# Patient Record
Sex: Male | Born: 1955
Health system: Southern US, Community
[De-identification: ages and names within clinical notes are randomized; demographics above are authoritative.]

## PROBLEM LIST (undated history)

## (undated) DIAGNOSIS — I2510 Atherosclerotic heart disease of native coronary artery without angina pectoris: Secondary | ICD-10-CM

## (undated) DIAGNOSIS — G839 Paralytic syndrome, unspecified: Secondary | ICD-10-CM

## (undated) DIAGNOSIS — Z8489 Family history of other specified conditions: Secondary | ICD-10-CM

## (undated) DIAGNOSIS — D751 Secondary polycythemia: Secondary | ICD-10-CM

## (undated) DIAGNOSIS — R0600 Dyspnea, unspecified: Secondary | ICD-10-CM

## (undated) DIAGNOSIS — K449 Diaphragmatic hernia without obstruction or gangrene: Secondary | ICD-10-CM

## (undated) DIAGNOSIS — F32A Depression, unspecified: Secondary | ICD-10-CM

## (undated) DIAGNOSIS — T7840XA Allergy, unspecified, initial encounter: Secondary | ICD-10-CM

## (undated) DIAGNOSIS — M1990 Unspecified osteoarthritis, unspecified site: Secondary | ICD-10-CM

## (undated) DIAGNOSIS — J849 Interstitial pulmonary disease, unspecified: Secondary | ICD-10-CM

## (undated) DIAGNOSIS — F419 Anxiety disorder, unspecified: Secondary | ICD-10-CM

## (undated) DIAGNOSIS — K259 Gastric ulcer, unspecified as acute or chronic, without hemorrhage or perforation: Secondary | ICD-10-CM

## (undated) DIAGNOSIS — R519 Headache, unspecified: Secondary | ICD-10-CM

## (undated) DIAGNOSIS — J189 Pneumonia, unspecified organism: Secondary | ICD-10-CM

## (undated) DIAGNOSIS — C801 Malignant (primary) neoplasm, unspecified: Secondary | ICD-10-CM

## (undated) DIAGNOSIS — D759 Disease of blood and blood-forming organs, unspecified: Secondary | ICD-10-CM

## (undated) DIAGNOSIS — Z87442 Personal history of urinary calculi: Secondary | ICD-10-CM

## (undated) DIAGNOSIS — N200 Calculus of kidney: Secondary | ICD-10-CM

## (undated) DIAGNOSIS — J302 Other seasonal allergic rhinitis: Secondary | ICD-10-CM

## (undated) DIAGNOSIS — J45909 Unspecified asthma, uncomplicated: Secondary | ICD-10-CM

## (undated) DIAGNOSIS — G4730 Sleep apnea, unspecified: Secondary | ICD-10-CM

## (undated) DIAGNOSIS — K219 Gastro-esophageal reflux disease without esophagitis: Secondary | ICD-10-CM

## (undated) HISTORY — DX: Anxiety disorder, unspecified: F41.9

## (undated) HISTORY — DX: Malignant (primary) neoplasm, unspecified: C80.1

## (undated) HISTORY — DX: Allergy, unspecified, initial encounter: T78.40XA

## (undated) HISTORY — DX: Sleep apnea, unspecified: G47.30

## (undated) HISTORY — DX: Gastric ulcer, unspecified as acute or chronic, without hemorrhage or perforation: K25.9

## (undated) HISTORY — PX: HERNIA REPAIR: SHX51

## (undated) HISTORY — PX: MELANOMA EXCISION: SHX5266

## (undated) HISTORY — DX: Calculus of kidney: N20.0

## (undated) HISTORY — DX: Gastro-esophageal reflux disease without esophagitis: K21.9

## (undated) HISTORY — DX: Other seasonal allergic rhinitis: J30.2

## (undated) HISTORY — DX: Diaphragmatic hernia without obstruction or gangrene: K44.9

## (undated) HISTORY — PX: KNEE SURGERY: SHX244

## (undated) HISTORY — DX: Unspecified osteoarthritis, unspecified site: M19.90

## (undated) HISTORY — DX: Unspecified asthma, uncomplicated: J45.909

---

## 1999-11-06 ENCOUNTER — Ambulatory Visit (HOSPITAL_BASED_OUTPATIENT_CLINIC_OR_DEPARTMENT_OTHER): Admission: RE | Admit: 1999-11-06 | Discharge: 1999-11-06 | Payer: Self-pay | Admitting: General Surgery

## 2005-08-03 ENCOUNTER — Ambulatory Visit: Payer: Self-pay | Admitting: Family Medicine

## 2007-09-18 DIAGNOSIS — K219 Gastro-esophageal reflux disease without esophagitis: Secondary | ICD-10-CM

## 2007-09-18 DIAGNOSIS — Z87442 Personal history of urinary calculi: Secondary | ICD-10-CM | POA: Insufficient documentation

## 2013-11-22 DIAGNOSIS — K259 Gastric ulcer, unspecified as acute or chronic, without hemorrhage or perforation: Secondary | ICD-10-CM

## 2013-11-22 HISTORY — PX: COLONOSCOPY: SHX174

## 2013-11-22 HISTORY — DX: Gastric ulcer, unspecified as acute or chronic, without hemorrhage or perforation: K25.9

## 2015-11-23 HISTORY — PX: POLYPECTOMY: SHX149

## 2015-11-23 HISTORY — PX: COLONOSCOPY: SHX174

## 2016-07-19 ENCOUNTER — Encounter: Payer: Self-pay | Admitting: Gastroenterology

## 2017-08-24 ENCOUNTER — Ambulatory Visit (INDEPENDENT_AMBULATORY_CARE_PROVIDER_SITE_OTHER): Payer: Managed Care, Other (non HMO) | Admitting: Family Medicine

## 2017-08-24 ENCOUNTER — Encounter: Payer: Self-pay | Admitting: Family Medicine

## 2017-08-24 VITALS — BP 130/88 | HR 74 | Temp 98.1°F | Ht 68.0 in | Wt 222.0 lb

## 2017-08-24 DIAGNOSIS — K259 Gastric ulcer, unspecified as acute or chronic, without hemorrhage or perforation: Secondary | ICD-10-CM | POA: Diagnosis not present

## 2017-08-24 DIAGNOSIS — K2100 Gastro-esophageal reflux disease with esophagitis, without bleeding: Secondary | ICD-10-CM

## 2017-08-24 DIAGNOSIS — Z85820 Personal history of malignant melanoma of skin: Secondary | ICD-10-CM | POA: Insufficient documentation

## 2017-08-24 DIAGNOSIS — Z23 Encounter for immunization: Secondary | ICD-10-CM

## 2017-08-24 DIAGNOSIS — Z0000 Encounter for general adult medical examination without abnormal findings: Secondary | ICD-10-CM

## 2017-08-24 DIAGNOSIS — Z0001 Encounter for general adult medical examination with abnormal findings: Secondary | ICD-10-CM

## 2017-08-24 LAB — LIPID PANEL
CHOLESTEROL: 143 mg/dL (ref 0–200)
HDL: 33.1 mg/dL — ABNORMAL LOW (ref >39.00)
LDL Cholesterol: 75 mg/dL (ref 0–99)
NonHDL: 109.4
TRIGLYCERIDES: 171 mg/dL — AB (ref 0.0–149.0)
Total CHOL/HDL Ratio: 4
VLDL: 34.2 mg/dL (ref 0.0–40.0)

## 2017-08-24 LAB — CBC WITH DIFFERENTIAL/PLATELET
Basophils Absolute: 0.1 10*3/uL (ref 0.0–0.1)
Basophils Relative: 1 % (ref 0.0–3.0)
EOS PCT: 5.5 % — AB (ref 0.0–5.0)
Eosinophils Absolute: 0.4 10*3/uL (ref 0.0–0.7)
HCT: 49.9 % (ref 39.0–52.0)
Hemoglobin: 16.7 g/dL (ref 13.0–17.0)
LYMPHS ABS: 1.9 10*3/uL (ref 0.7–4.0)
Lymphocytes Relative: 24.5 % (ref 12.0–46.0)
MCHC: 33.4 g/dL (ref 30.0–36.0)
MCV: 92.9 fl (ref 78.0–100.0)
MONOS PCT: 8.3 % (ref 3.0–12.0)
Monocytes Absolute: 0.6 10*3/uL (ref 0.1–1.0)
NEUTROS ABS: 4.6 10*3/uL (ref 1.4–7.7)
NEUTROS PCT: 60.7 % (ref 43.0–77.0)
PLATELETS: 345 10*3/uL (ref 150.0–400.0)
RBC: 5.37 Mil/uL (ref 4.22–5.81)
RDW: 13.4 % (ref 11.5–15.5)
WBC: 7.6 10*3/uL (ref 4.0–10.5)

## 2017-08-24 LAB — POC URINALSYSI DIPSTICK (AUTOMATED)
BILIRUBIN UA: NEGATIVE
CLARITY UA: NEGATIVE
Glucose, UA: NEGATIVE
KETONES UA: NEGATIVE
LEUKOCYTES UA: NEGATIVE
Nitrite, UA: NEGATIVE
Spec Grav, UA: >=1.03 — AB (ref 1.010–1.025)
Urobilinogen, UA: 0.2 E.U./dL
pH, UA: 6 (ref 5.0–8.0)

## 2017-08-24 LAB — PSA: PSA: 2.19 ng/mL (ref 0.10–4.00)

## 2017-08-24 LAB — BASIC METABOLIC PANEL
BUN: 16 mg/dL (ref 6–23)
CALCIUM: 9.5 mg/dL (ref 8.4–10.5)
CO2: 26 meq/L (ref 19–32)
CREATININE: 0.94 mg/dL (ref 0.40–1.50)
Chloride: 103 mEq/L (ref 96–112)
GFR: 87.01 mL/min (ref >60.00)
GLUCOSE: 72 mg/dL (ref 70–99)
Potassium: 4.2 mEq/L (ref 3.5–5.1)
Sodium: 139 mEq/L (ref 135–145)

## 2017-08-24 LAB — HEPATIC FUNCTION PANEL
ALT: 27 U/L (ref 0–53)
AST: 24 U/L (ref 0–37)
Albumin: 4.2 g/dL (ref 3.5–5.2)
Alkaline Phosphatase: 66 U/L (ref 39–117)
BILIRUBIN DIRECT: 0.2 mg/dL (ref 0.0–0.3)
TOTAL PROTEIN: 7.7 g/dL (ref 6.0–8.3)
Total Bilirubin: 1.6 mg/dL — ABNORMAL HIGH (ref 0.2–1.2)

## 2017-08-24 LAB — TSH: TSH: 2.54 u[IU]/mL (ref 0.35–4.50)

## 2017-08-24 MED ORDER — OMEPRAZOLE 40 MG PO CPDR: 40.0000 mg | DELAYED_RELEASE_CAPSULE | Freq: Every day | ORAL | 3 refills | Status: DC

## 2017-08-24 NOTE — Progress Notes (Signed)
   Subjective:    Patient ID: Michael Bernard, male    DOB: 02-17-1956, 61 y.o.   MRN: [id number]  HPI 61 yr old male to re-establish with is after an absence of 12 years, and he would like a well exam. He lives in [place] and he had been seeing Dr. Gilford Rile in [facility] for primary care. He describes frequent heartburn and occasional epigastric pains. He has a hx of GERD, gastric ulcers, and a hiatal hernia. His GI doctor wanted him to take Dexilant, but his insurance wouldn't cover this. He has tried OTC agents like Zantaz, Prilosec, and Nexium but they do not help very much. He has been diagnosed with an umbilical hernia but this is not symptomatic.    Review of Systems  Constitutional: Negative.   HENT: Negative.   Eyes: Negative.   Respiratory: Negative.   Cardiovascular: Negative.   Gastrointestinal: Positive for abdominal pain. Negative for abdominal distention, anal bleeding, blood in stool, constipation, diarrhea, nausea, rectal pain and vomiting.  Genitourinary: Negative.   Musculoskeletal: Negative.   Skin: Negative.   Neurological: Negative.   Psychiatric/Behavioral: Negative.        Objective:   Physical Exam  Constitutional: He is oriented to person, place, and time. He appears well-developed and well-nourished. No distress.  HENT:  Head: Normocephalic and atraumatic.  Right Ear: External ear normal.  Left Ear: External ear normal.  Nose: Nose normal.  Mouth/Throat: Oropharynx is clear and moist. No oropharyngeal exudate.  Eyes: Pupils are equal, round, and reactive to light. Conjunctivae and EOM are normal. Right eye exhibits no discharge. Left eye exhibits no discharge. No scleral icterus.  Neck: Neck supple. No JVD present. No tracheal deviation present. No thyromegaly present.  Cardiovascular: Normal rate, regular rhythm, normal heart sounds and intact distal pulses.  Exam reveals no gallop and no friction rub.   No murmur heard. Pulmonary/Chest: Effort normal  and breath sounds normal. No respiratory distress. He has no wheezes. He has no rales. He exhibits no tenderness.  Abdominal: Soft. Bowel sounds are normal. He exhibits no distension. There is no tenderness. There is no rebound and no guarding.  Small non-tender easily reducible umbilical hernia   Genitourinary: Rectum normal, prostate normal and penis normal. Rectal exam shows guaiac negative stool. No penile tenderness.  Musculoskeletal: Normal range of motion. He exhibits no edema or tenderness.  Lymphadenopathy:    He has no cervical adenopathy.  Neurological: He is alert and oriented to person, place, and time. He has normal reflexes. No cranial nerve deficit. He exhibits normal muscle tone. Coordination normal.  Skin: Skin is warm and dry. No rash noted. He is not diaphoretic. No erythema. No pallor.  Psychiatric: He has a normal mood and affect. His behavior is normal. Judgment and thought content normal.          Assessment & Plan:  Well exam. We discussed diet and exercise. He will start on Omeprazole 40 mg daily. Get fasting labs today.  Alysia Penna, MD

## 2017-08-24 NOTE — Patient Instructions (Signed)
WE NOW OFFER   [facility] FAST TRACK!!!  SAME DAY Appointments for ACUTE CARE  Such as: Sprains, Injuries, cuts, abrasions, rashes, muscle pain, joint pain, back pain Colds, flu, sore throats, headache, allergies, cough, fever  Ear pain, sinus and eye infections Abdominal pain, nausea, vomiting, diarrhea, upset stomach Animal/insect bites  3 Easy Ways to Schedule: Walk-In Scheduling Call in scheduling Mychart Sign-up: [email or web address]         

## 2017-10-24 ENCOUNTER — Encounter: Payer: Self-pay | Admitting: Family Medicine

## 2017-10-24 ENCOUNTER — Ambulatory Visit: Payer: Managed Care, Other (non HMO) | Admitting: Family Medicine

## 2017-10-24 VITALS — BP 124/62 | HR 81 | Temp 97.9°F | Wt 226.0 lb

## 2017-10-24 DIAGNOSIS — G4710 Hypersomnia, unspecified: Secondary | ICD-10-CM | POA: Diagnosis not present

## 2017-10-24 DIAGNOSIS — G4730 Sleep apnea, unspecified: Secondary | ICD-10-CM | POA: Diagnosis not present

## 2017-10-24 NOTE — Progress Notes (Signed)
   Subjective:    Patient ID: Michael Bernard, male    DOB: [birth year], 61 y.o.   MRN: [id number]  HPI Here for possible sleep apnea. He has had trouble sleeping for years and he stays tired and sleepy all day. His wife says he snores and he stops breathing frequently.    Review of Systems  Constitutional: Positive for fatigue.  Respiratory: Negative.   Cardiovascular: Negative.   Neurological: Negative.        Objective:   Physical Exam  Constitutional: He is oriented to person, place, and time. He appears well-developed and well-nourished.  Cardiovascular: Normal rate, regular rhythm, normal heart sounds and intact distal pulses.  Pulmonary/Chest: Effort normal and breath sounds normal. No respiratory distress. He has no wheezes. He has no rales.  Neurological: He is alert and oriented to person, place, and time.          Assessment & Plan:  Probable sleep apnea. Refer to Pulmonary.  Alysia Penna, MD

## 2017-11-07 ENCOUNTER — Encounter: Payer: Self-pay | Admitting: Internal Medicine

## 2017-11-07 ENCOUNTER — Ambulatory Visit: Payer: Managed Care, Other (non HMO) | Admitting: Internal Medicine

## 2017-11-07 VITALS — BP 124/68 | HR 89 | Ht 67.0 in | Wt 225.0 lb

## 2017-11-07 DIAGNOSIS — R0609 Other forms of dyspnea: Secondary | ICD-10-CM

## 2017-11-07 DIAGNOSIS — G4733 Obstructive sleep apnea (adult) (pediatric): Secondary | ICD-10-CM

## 2017-11-07 DIAGNOSIS — F5101 Primary insomnia: Secondary | ICD-10-CM | POA: Diagnosis not present

## 2017-11-07 DIAGNOSIS — G4700 Insomnia, unspecified: Secondary | ICD-10-CM | POA: Insufficient documentation

## 2017-11-07 NOTE — Assessment & Plan Note (Signed)
Active diagnosis, high probability based on history and exam.  Appropriate educational discussion done with attention to sleep hygiene, weight, driving responsibility. Plan-study.

## 2017-11-07 NOTE — Assessment & Plan Note (Signed)
Story of long employment working third shift suggests poorly defined circadian rhythm sleep/wake cycle.  Sleep disturbance by other medical problems including GERD and OSA is possible.  Prescription sleep medication may be helpful but we need to understand if he has sleep apnea first.

## 2017-11-07 NOTE — Progress Notes (Signed)
11/07/17-61 year old male former smoker for sleep evaluation.referred by Dr Sarajane Jews for ? OSA, wakes up during the night. Complains that he sleeps very little with difficulty falling asleep and staying asleep.  May drift off with TV around 830 but is awake again by 1 or 2 AM.  Lifelong pattern.  Works third shift for 18 years.  No problems staying awake in the daytime. Told of loud snoring and witnessed apneas.  ENT surgery limited to tonsils.  OTC sleep medicines no help.  He denies parasomnias-leg jerks, sleepwalking, sleep talking etc.  No history of seizure. Sleep is disturbed by "bad GERD".  He is treated by a gastroenterologist in [place]-Dr. Gaetano Net briefly around age 61.  Worked as a Building control surveyor.  Admits some dyspnea at times with exertion and some dry cough.  Not aware of diagnosed lung disease.  No recent cold which he is getting over. Epworth- 12  Prior to Admission medications   Medication Sig Start Date End Date Taking? Authorizing Provider  albuterol (PROVENTIL HFA;VENTOLIN HFA) 108 (90 Base) MCG/ACT inhaler Inhale 2 puffs into the lungs every 6 (six) hours as needed for wheezing or shortness of breath.   Yes [provider]  budesonide-formoterol (SYMBICORT) 80-4.5 MCG/ACT inhaler Inhale 2 puffs into the lungs 2 (two) times daily.   Yes [provider]  clarithromycin (BIAXIN) 500 MG tablet Take 500 mg by mouth 2 (two) times daily.   Yes [provider]  fexofenadine (ALLEGRA) 60 MG tablet Take 180 mg by mouth 2 (two) times daily.   Yes [provider]  omeprazole (PRILOSEC) 40 MG capsule Take 1 capsule (40 mg total) by mouth daily. 08/24/17  Yes [name], MD   Past Medical History:  Diagnosis Date  . GERD (gastroesophageal reflux disease)    sees Dr. Lyndel Safe in [place]   . Hiatal hernia   . Kidney stones   . Multiple gastric ulcers 2015   seen on endoscopy per Dr. Lyndel Safe in [place]    Past Surgical History:  Procedure Laterality Date  .  COLONOSCOPY  2015   per Dr. Lyndel Safe in [place], benign polyps, not sure of his follow up recommnedations   . HERNIA REPAIR Bilateral    both inguinal hernias repaired with mesh   . MELANOMA EXCISION     per Dr. Jimmye Norman in [place], 3 removed from both shoulders and left temple   ' Family History  Problem Relation Age of Onset  . Arthritis Mother   . Cancer Mother   . Hyperlipidemia Mother   . Hypertension Mother   . Leukemia Mother   . Atrial fibrillation Mother   . Heart disease Father   . Hypertension Father   . Seizures Sister    Social History   Socioeconomic History  . Marital status: Married    Spouse name: Not on file  . Number of children: Not on file  . Years of education: Not on file  . Highest education level: Not on file  Social Needs  . Financial resource strain: Not on file  . Food insecurity - worry: Not on file  . Food insecurity - inability: Not on file  . Transportation needs - medical: Not on file  . Transportation needs - non-medical: Not on file  Occupational History  . Not on file  Tobacco Use  . Smoking status: Former Research scientist (life sciences)  . Smokeless tobacco: Never Used  Substance and Sexual Activity  . Alcohol use: Not on file  . Drug use: Not on  file  . Sexual activity: Not on file  Other Topics Concern  . Not on file  Social History Narrative  . Not on file   ROS-see HPI   + = positive Constitutional:    weight loss, night sweats, fevers, chills, fatigue, lassitude. HEENT:    headaches, difficulty swallowing, tooth/dental problems, sore throat,       sneezing, itching, ear ache, =nasal congestion, post nasal drip, snoring CV:    chest pain, orthopnea, PND, swelling in lower extremities, anasarca,                                                       dizziness, palpitations Resp:  + shortness of breath with exertion or at rest.               + productive cough,   non-productive cough, coughing up of blood.              change in color of mucus.   wheezing.   Skin:    rash or lesions. GI:    + heartburn,+ indigestion, abdominal pain, nausea, vomiting, diarrhea,                 change in bowel habits, loss of appetite GU: dysuria, change in color of urine, no urgency or frequency.   flank pain. MS:   joint pain, stiffness, decreased range of motion, back pain. Neuro-     nothing unusual Psych:  change in mood or affect.  depression or anxiety.   memory loss.  OBJ- Physical Exam General- Alert, Oriented, Affect-appropriate, Distress- none acute, + overweight Skin- rash-none, lesions- none, excoriation- none Lymphadenopathy- none Head- atraumatic            Eyes- Gross vision intact, PERRLA, conjunctivae and secretions clear            Ears- Hearing, canals-normal            Nose- Clear, no-Septal dev, mucus, polyps, erosion, perforation             Throat- Mallampati III , mucosa clear , drainage- none, tonsils- atrophic active gag and complaint of                        TMJ pain holding his mouth open. Neck- flexible , trachea midline, no stridor , thyroid nl, carotid no bruit Chest - symmetrical excursion , unlabored           Heart/CV- RRR , no murmur , no gallop  , no rub, nl s1 s2                           - JVD- none , edema- none, stasis changes- none, varices- none           Lung-+wheeze on expiration, cough + dry, dullness-none, rub- none           Chest wall-  Abd-  Br/ Gen/ Rectal- Not done, not indicated Extrem- cyanosis- none, clubbing, none, atrophy- none, strength- nl Neuro- grossly intact to observation

## 2017-11-07 NOTE — Assessment & Plan Note (Signed)
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  Plan-PFT

## 2017-11-07 NOTE — Patient Instructions (Signed)
Order- schedule unattended home sleep test   Dx OSA  Order- schedule PFT   Dx dyspnea on exertion  Please call me about 2 weeks after your sleep study, for results and recommendations. We might be able to start treatment, if needed, before we see you back.

## 2017-11-30 ENCOUNTER — Encounter: Payer: Self-pay | Admitting: Family Medicine

## 2017-11-30 ENCOUNTER — Ambulatory Visit: Payer: Managed Care, Other (non HMO) | Admitting: Family Medicine

## 2017-11-30 VITALS — BP 110/80 | HR 79 | Temp 98.6°F | Wt 221.6 lb

## 2017-11-30 DIAGNOSIS — J209 Acute bronchitis, unspecified: Secondary | ICD-10-CM | POA: Diagnosis not present

## 2017-11-30 MED ORDER — HYDROCOD POLST-CPM POLST ER 10-8 MG/5ML PO SUER: 5.0000 mL | Freq: Two times a day (BID) | ORAL | 0 refills | Status: DC | PRN

## 2017-11-30 MED ORDER — LEVOFLOXACIN 500 MG PO TABS: 500.0000 mg | ORAL_TABLET | Freq: Every day | ORAL | 0 refills | Status: AC

## 2017-11-30 NOTE — Progress Notes (Signed)
   Subjective:    Patient ID: Michael Bernard, male    DOB: 01-25-1956, 62 y.o.   MRN: [id number]  HPI Here for recurrent URI symptoms. This started in late October with a clear sinus infection. He went to an urgent care and was treated with Ceftin and seemed to get better. Then the sinus congestion returned and he developed a cough in November. He saw urgent care again and was treated with Biaxin, Symbicort, Proair, Tussionex, and a steroid shot. Again he felt a little better but never back to normal. Now for the past week he has had chest congestion and a cough producing yellow sputum. No fever.    Review of Systems  HENT: Positive for congestion, postnasal drip and sinus pressure. Negative for sinus pain and sore throat.   Eyes: Negative.   Respiratory: Positive for cough, chest tightness and wheezing. Negative for shortness of breath.        Objective:   Physical Exam  Constitutional: He appears well-developed and well-nourished.  HENT:  Right Ear: External ear normal.  Left Ear: External ear normal.  Nose: Nose normal.  Mouth/Throat: Oropharynx is clear and moist.  Eyes: Conjunctivae are normal.  Neck: No thyromegaly present.  Pulmonary/Chest: Effort normal. No respiratory distress. He has no rales.  Scattered wheezes and rhonchi   Lymphadenopathy:    He has no cervical adenopathy.          Assessment & Plan:  Bronchitis, treat with Levaquin. He can still use the inhalers as well. Alysia Penna, MD

## 2017-12-14 DIAGNOSIS — G4733 Obstructive sleep apnea (adult) (pediatric): Secondary | ICD-10-CM | POA: Diagnosis not present

## 2017-12-15 DIAGNOSIS — G4733 Obstructive sleep apnea (adult) (pediatric): Secondary | ICD-10-CM | POA: Diagnosis not present

## 2017-12-23 ENCOUNTER — Other Ambulatory Visit: Payer: Self-pay | Admitting: *Deleted

## 2017-12-23 DIAGNOSIS — G4733 Obstructive sleep apnea (adult) (pediatric): Secondary | ICD-10-CM

## 2018-01-02 ENCOUNTER — Telehealth: Payer: Self-pay | Admitting: Internal Medicine

## 2018-01-02 DIAGNOSIS — G4733 Obstructive sleep apnea (adult) (pediatric): Secondary | ICD-10-CM

## 2018-01-02 NOTE — Telephone Encounter (Signed)
lmtcb for pt.  

## 2018-01-02 NOTE — Telephone Encounter (Signed)
Pt returned call regarding his HST he had done.  I stated to pt that we can see where the HST has been finalized but it has not been reviewed and resulted by CY.  Stated to pt as soon as it is resulted by CY we would call him back with the results.  Dr. Annamaria Boots, please advise on the results of pt's HST.  Thanks!

## 2018-01-02 NOTE — Telephone Encounter (Signed)
Order to DME placed. Patient aware. Nothing further needed.

## 2018-01-02 NOTE — Telephone Encounter (Signed)
Attempted to call pt but no answer.   Left message for pt to return our call x1 

## 2018-01-02 NOTE — Telephone Encounter (Signed)
His home sleep test showed that he has severe obstructive sleep apnea, averaging 38 apnea events/ hour, with drops in blood oxygen level.  As discussed at our office meeting, I recommend we order new DME, new CPAP auto 5-20, mask of choice, humidifier, supplies, AirView  For dx OSA  Please make sure he has an appointment back in 31-90 days, per insurance regs

## 2018-01-02 NOTE — Telephone Encounter (Signed)
Pt is calling back [phone]

## 2018-02-03 ENCOUNTER — Encounter: Payer: Self-pay | Admitting: Internal Medicine

## 2018-02-07 ENCOUNTER — Ambulatory Visit (INDEPENDENT_AMBULATORY_CARE_PROVIDER_SITE_OTHER): Payer: Managed Care, Other (non HMO) | Admitting: Internal Medicine

## 2018-02-07 ENCOUNTER — Encounter: Payer: Self-pay | Admitting: Internal Medicine

## 2018-02-07 ENCOUNTER — Ambulatory Visit: Payer: Managed Care, Other (non HMO) | Admitting: Internal Medicine

## 2018-02-07 VITALS — BP 118/78 | HR 84 | Ht 68.0 in | Wt 221.0 lb

## 2018-02-07 DIAGNOSIS — R0609 Other forms of dyspnea: Secondary | ICD-10-CM | POA: Diagnosis not present

## 2018-02-07 DIAGNOSIS — F5101 Primary insomnia: Secondary | ICD-10-CM | POA: Diagnosis not present

## 2018-02-07 DIAGNOSIS — G4733 Obstructive sleep apnea (adult) (pediatric): Secondary | ICD-10-CM

## 2018-02-07 DIAGNOSIS — J4530 Mild persistent asthma, uncomplicated: Secondary | ICD-10-CM | POA: Diagnosis not present

## 2018-02-07 LAB — PULMONARY FUNCTION TEST
DL/VA % PRED: 100 %
DL/VA: 4.54 ml/min/mmHg/L
DLCO unc % pred: 77 %
DLCO unc: 23 ml/min/mmHg
FEF 25-75 POST: 3.01 L/s
FEF 25-75 Pre: 3.22 L/sec
FEF2575-%CHANGE-POST: -6 %
FEF2575-%Pred-Post: 108 %
FEF2575-%Pred-Pre: 116 %
FEV1-%CHANGE-POST: 0 %
FEV1-%PRED-PRE: 87 %
FEV1-%Pred-Post: 86 %
FEV1-PRE: 2.92 L
FEV1-Post: 2.9 L
FEV1FVC-%CHANGE-POST: 0 %
FEV1FVC-%PRED-PRE: 108 %
FEV6-%Change-Post: 0 %
FEV6-%PRED-PRE: 82 %
FEV6-%Pred-Post: 82 %
FEV6-POST: 3.48 L
FEV6-Pre: 3.49 L
FEV6FVC-%Change-Post: 0 %
FEV6FVC-%Pred-Post: 105 %
FEV6FVC-%Pred-Pre: 105 %
FVC-%CHANGE-POST: -1 %
FVC-%PRED-PRE: 79 %
FVC-%Pred-Post: 78 %
FVC-POST: 3.49 L
FVC-Pre: 3.53 L
POST FEV6/FVC RATIO: 100 %
PRE FEV6/FVC RATIO: 100 %
Post FEV1/FVC ratio: 83 %
Pre FEV1/FVC ratio: 83 %
RV % pred: 75 %
RV: 1.61 L
TLC % pred: 77 %
TLC: 5.09 L

## 2018-02-07 MED ORDER — UMECLIDINIUM-VILANTEROL 62.5-25 MCG/INH IN AEPB: 1.0000 | INHALATION_SPRAY | Freq: Every day | RESPIRATORY_TRACT | 0 refills | Status: DC

## 2018-02-07 NOTE — Progress Notes (Signed)
PFT done today. 

## 2018-02-07 NOTE — Assessment & Plan Note (Signed)
He had described a dry cough and some dyspnea with exertion.  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  Slight restrictive pattern on PFT can be reassessed. Plan-therapeutic trial sample Anoro

## 2018-02-07 NOTE — Assessment & Plan Note (Signed)
Reviewed basic sleep hygiene and discussed some alternative sleep aids.  He really does not want to take anything.  Okay to take occasional short nap.

## 2018-02-07 NOTE — Progress Notes (Signed)
HPI  male former smoker for sleep evaluation.referred by Dr Sarajane Jews for ? OSA, wakes up during the night. HST 12/14/17-AHI 37.9/hour, desaturation to 68%, body weight 225 pounds PFT 02/07/18- Mild restriction, mild Diffusion reduction, FVC 3.49/78%, FEV1 2.90/86%, ratio 0.83, FEF 25-75% 3.01/108%, No R to BD, TLC 77%, DLCO 77% ------------------------------------------------------------------------------------  11/07/17-62 year old male former smoker for sleep evaluation.referred by Dr Sarajane Jews for ? OSA, wakes up during the night. Complains that he sleeps very little with difficulty falling asleep and staying asleep.  May drift off with TV around 830 but is awake again by 1 or 2 AM.  Lifelong pattern.  Works third shift for 18 years.  No problems staying awake in the daytime. Told of loud snoring and witnessed apneas.  ENT surgery limited to tonsils.  OTC sleep medicines no help.  He denies parasomnias-leg jerks, sleepwalking, sleep talking etc.  No history of seizure. Sleep is disturbed by "bad GERD".  He is treated by a gastroenterologist in [place]-Dr. Gaetano Net briefly around age 20.  Worked as a Building control surveyor.  Admits some dyspnea at times with exertion and some dry cough.  Not aware of diagnosed lung disease.  No recent cold which he is getting over. Epworth- 12  02/07/18- 62 year old male former smoker followed for OSA, insomnia, asthmatic bronchitis HST 12/14/17-AHI 37.9/hour, desaturation to 68%, body weight 225 pounds CPAP auto 5-20/Advanced begun 01/02/18 Changed to more comfortable nasal mask and download recording began 2/27.  He definitely sleeps better with CPAP.  Usual bedtime around 9:30 PM, getting up around 2 AM routinely.  Declines a sleep aids.  Discussed sleep hygiene. PFT 02/07/18- Mild restriction, mild Diffusion reduction, FVC 3.49/78%, FEV1 2.90/86%, ratio 0.83, FEF 25-75% 3.01/108%, No R to BD, TLC 77%, DLCO 77% ----OSA; DME [facility]. Pt wears CPAP-just recently got set up-DL attached. Review  PFT with patient as well. We reviewed his CPAP experience and download in detail.  Reviewed PFT and discussed trial of bronchodilator.  ROS-see HPI   + = positive Constitutional:    weight loss, night sweats, fevers, chills, fatigue, lassitude. HEENT:    headaches, difficulty swallowing, tooth/dental problems, sore throat,       sneezing, itching, ear ache, =nasal congestion, post nasal drip, snoring CV:    chest pain, orthopnea, PND, swelling in lower extremities, anasarca,                                                       dizziness, palpitations Resp:  + shortness of breath with exertion or at rest.               + productive cough,   non-productive cough, coughing up of blood.              change in color of mucus.  wheezing.   Skin:    rash or lesions. GI:    + heartburn,+ indigestion, abdominal pain, nausea, vomiting, diarrhea,                 change in bowel habits, loss of appetite GU: dysuria, change in color of urine, no urgency or frequency.   flank pain. MS:   joint pain, stiffness, decreased range of motion, back pain. Neuro-     nothing unusual Psych:  change in mood or affect.  depression or anxiety.   memory loss.  OBJ- Physical Exam General- Alert, Oriented, Affect-appropriate, Distress- none acute, + overweight Skin- rash-none, lesions- none, excoriation- none Lymphadenopathy- none Head- atraumatic            Eyes- Gross vision intact, PERRLA, conjunctivae and secretions clear            Ears- Hearing, canals-normal            Nose- Clear, no-Septal dev, mucus, polyps, erosion, perforation             Throat- Mallampati III , mucosa clear , drainage- none, tonsils- atrophic,  Neck- flexible , trachea midline, no stridor , thyroid nl, carotid no bruit Chest - symmetrical excursion , unlabored           Heart/CV- RRR , no murmur , no gallop  , no rub, nl s1 s2                           - JVD- none , edema- none, stasis changes- none, varices- none            Lung-clear, cough-none, dullness-none, rub- none           Chest wall-  Abd-  Br/ Gen/ Rectal- Not done, not indicated Extrem- cyanosis- none, clubbing, none, atrophy- none, strength- nl Neuro- grossly intact to observation

## 2018-02-07 NOTE — Assessment & Plan Note (Addendum)
Good start on CPAP.  Has worked out comfort issues.  Sleeping better.  We discussed compliance goals Plan-continue CPAP auto 5-20

## 2018-02-07 NOTE — Patient Instructions (Signed)
We can continue CPAP auto 5-20, mask of choice, humidifier, supplies, AirView  Dx OSA  Sample Anoro inhaler      Inhale 1 puff, once daily    See if it helps your breathing

## 2018-06-09 ENCOUNTER — Ambulatory Visit: Payer: Managed Care, Other (non HMO) | Admitting: Internal Medicine

## 2018-06-09 ENCOUNTER — Encounter: Payer: Self-pay | Admitting: Internal Medicine

## 2018-06-09 VITALS — BP 128/78 | HR 74 | Ht 68.0 in | Wt 223.0 lb

## 2018-06-09 DIAGNOSIS — J302 Other seasonal allergic rhinitis: Secondary | ICD-10-CM | POA: Diagnosis not present

## 2018-06-09 DIAGNOSIS — J3089 Other allergic rhinitis: Secondary | ICD-10-CM

## 2018-06-09 DIAGNOSIS — G4733 Obstructive sleep apnea (adult) (pediatric): Secondary | ICD-10-CM | POA: Diagnosis not present

## 2018-06-09 MED ORDER — AZELASTINE-FLUTICASONE 137-50 MCG/ACT NA SUSP: 1.0000 | Freq: Every day | NASAL | 0 refills | Status: DC

## 2018-06-09 MED ORDER — AZELASTINE-FLUTICASONE 137-50 MCG/ACT NA SUSP: 1.0000 | Freq: Every day | NASAL | 99 refills | Status: DC

## 2018-06-09 NOTE — Progress Notes (Signed)
HPI  male former smoker for sleep evaluation.referred by Dr Sarajane Jews for ? OSA, wakes up during the night. HST 12/14/17-AHI 37.9/hour, desaturation to 68%, body weight 225 pounds PFT 02/07/18- Mild restriction, mild Diffusion reduction, FVC 3.49/78%, FEV1 2.90/86%, ratio 0.83, FEF 25-75% 3.01/108%, No R to BD, TLC 77%, DLCO 77% ------------------------------------------------------------------------------------ 02/07/18- 62 year old male former smoker followed for OSA, insomnia, asthmatic bronchitis HST 12/14/17-AHI 37.9/hour, desaturation to 68%, body weight 225 pounds CPAP auto 5-20/Advanced begun 01/02/18 Changed to more comfortable nasal mask and download recording began 2/27.  He definitely sleeps better with CPAP.  Usual bedtime around 9:30 PM, getting up around 2 AM routinely.  Declines a sleep aids.  Discussed sleep hygiene. PFT 02/07/18- Mild restriction, mild Diffusion reduction, FVC 3.49/78%, FEV1 2.90/86%, ratio 0.83, FEF 25-75% 3.01/108%, No R to BD, TLC 77%, DLCO 77% ----OSA; DME [facility]. Pt wears CPAP-just recently got set up-DL attached. Review PFT with patient as well. We reviewed his CPAP experience and download in detail.  Reviewed PFT and discussed trial of bronchodilator.  06/09/2018- 62 year old male former smoker followed for OSA, insomnia, asthmatic bronchitis CPAP auto 5-20/Advanced begun 01/02/18 -----He has been doing well but he has had sinus issuse that causes him to not be able to stay on it on some nights.Breathing is about the same as last ov. Download 57% compliance AHI 0.1/hour.  Many short nights, some missed nights. We reviewed his download and discussed compliance goals. He complains nasal congestion interferes with CPAP.  Previous allergy skin testing is been positive for environmental allergens.  No history of polyps or nasal surgery.  ROS-see HPI   + = positive Constitutional:    weight loss, night sweats, fevers, chills, fatigue, lassitude. HEENT:    headaches,  difficulty swallowing, tooth/dental problems, sore throat,       sneezing, itching, ear ache, +nasal congestion, post nasal drip, snoring CV:    chest pain, orthopnea, PND, swelling in lower extremities, anasarca,                                                       dizziness, palpitations Resp:  + shortness of breath with exertion or at rest.               + productive cough,   non-productive cough, coughing up of blood.              change in color of mucus.  wheezing.   Skin:    rash or lesions. GI:     heartburn,+ indigestion, abdominal pain, nausea, vomiting, diarrhea,                 change in bowel habits, loss of appetite GU: dysuria, change in color of urine, no urgency or frequency.   flank pain. MS:   joint pain, stiffness, decreased range of motion, back pain. Neuro-     nothing unusual Psych:  change in mood or affect.  depression or anxiety.   memory loss.  OBJ- Physical Exam General- Alert, Oriented, Affect-appropriate, Distress- none acute, + overweight Skin- rash-none, lesions- none, excoriation- none Lymphadenopathy- none Head- atraumatic            Eyes- Gross vision intact, PERRLA, conjunctivae and secretions clear            Ears- Hearing, canals-normal  Nose- Clear, no-Septal dev, mucus, polyps, erosion, perforation             Throat- Mallampati III , mucosa clear , drainage- none, tonsils- atrophic,  Neck- flexible , trachea midline, no stridor , thyroid nl, carotid no bruit Chest - symmetrical excursion , unlabored           Heart/CV- RRR , no murmur , no gallop  , no rub, nl s1 s2                           - JVD- none , edema- none, stasis changes- none, varices- none           Lung-clear, cough-none, dullness-none, rub- none           Chest wall-  Abd-  Br/ Gen/ Rectal- Not done, not indicated Extrem- cyanosis- none, clubbing, none, atrophy- none, strength- nl Neuro- grossly intact to observation

## 2018-06-09 NOTE — Patient Instructions (Signed)
We can continue using CPAP auto 5-20, mask of choice, humidifier, supplies, AirView  Sample and printed script for Dymista nasal spray      Try 1 puff each nostril once daily at bedtime when needed.  If it works well, but too expensive with your insurance, let us know.  You can try Breathe Right nasal strips under your CPAP mask, to see if that helps with stuffiness.   Please call if we can help

## 2018-06-10 DIAGNOSIS — J3089 Other allergic rhinitis: Secondary | ICD-10-CM

## 2018-06-10 DIAGNOSIS — J302 Other seasonal allergic rhinitis: Secondary | ICD-10-CM | POA: Insufficient documentation

## 2018-06-10 NOTE — Assessment & Plan Note (Signed)
We discussed use of Flonase, sample trial of Dymista for comparison, Breathe Right nasal strips.  If these do not give adequate control of nasal congestion, he could try limited use of Afrin, 1 spray each nostril only at bedtime needed.  Consider allergy referral.

## 2018-06-10 NOTE — Assessment & Plan Note (Signed)
We discussed compliance goals.  He benefits from CPAP when he can wear it.  Discussed oral appliances as alternative. Plan-continue CPAP auto 5-20

## 2018-08-29 ENCOUNTER — Encounter: Payer: Self-pay | Admitting: Family Medicine

## 2018-08-29 ENCOUNTER — Ambulatory Visit (INDEPENDENT_AMBULATORY_CARE_PROVIDER_SITE_OTHER): Payer: Managed Care, Other (non HMO) | Admitting: Family Medicine

## 2018-08-29 VITALS — BP 120/74 | HR 72 | Temp 97.7°F | Ht 67.5 in | Wt 224.2 lb

## 2018-08-29 DIAGNOSIS — Z0000 Encounter for general adult medical examination without abnormal findings: Secondary | ICD-10-CM

## 2018-08-29 MED ORDER — OMEPRAZOLE 40 MG PO CPDR: 40.0000 mg | DELAYED_RELEASE_CAPSULE | Freq: Every day | ORAL | 3 refills | Status: DC

## 2018-08-29 NOTE — Progress Notes (Signed)
   Subjective:    Patient ID: Michael Bernard, male    DOB: 1956-03-26, 61 y.o.   MRN: [id number]  HPI Here for a well exam. He feels fine but asks me to check a spot on his chest. He sees a Dr. Jimmye Norman in [place] for Dermatology care, and he has removed several melanomas from [facility] skin. One of these was from an area in the upper chest this past spring. Now for the past few weeks he has noticed a lump growing back at the surgical site. He notes some increased frequency of urination, he has nocturia times 2-3. No discomfort.    Review of Systems  Constitutional: Negative.   HENT: Negative.   Eyes: Negative.   Respiratory: Negative.   Cardiovascular: Negative.   Gastrointestinal: Negative.   Genitourinary: Positive for frequency. Negative for discharge, dysuria, flank pain, hematuria and urgency.  Musculoskeletal: Negative.   Skin: Negative.   Neurological: Negative.   Psychiatric/Behavioral: Negative.        Objective:   Physical Exam  Constitutional: He is oriented to person, place, and time. He appears well-developed and well-nourished. No distress.  HENT:  Head: Normocephalic and atraumatic.  Right Ear: External ear normal.  Left Ear: External ear normal.  Nose: Nose normal.  Mouth/Throat: Oropharynx is clear and moist. No oropharyngeal exudate.  Eyes: Pupils are equal, round, and reactive to light. Conjunctivae and EOM are normal. Right eye exhibits no discharge. Left eye exhibits no discharge. No scleral icterus.  Neck: Neck supple. No JVD present. No tracheal deviation present. No thyromegaly present.  Cardiovascular: Normal rate, regular rhythm, normal heart sounds and intact distal pulses. Exam reveals no gallop and no friction rub.  No murmur heard. Pulmonary/Chest: Effort normal and breath sounds normal. No respiratory distress. He has no wheezes. He has no rales. He exhibits no tenderness.  Abdominal: Soft. Bowel sounds are normal. He exhibits no distension and no mass.  There is no tenderness. There is no rebound and no guarding.  Genitourinary: Rectum normal and penis normal. Rectal exam shows guaiac negative stool. No penile tenderness.  Genitourinary Comments: Prostate is moderately enlarged but not nodular or tender   Musculoskeletal: Normal range of motion. He exhibits no edema or tenderness.  Lymphadenopathy:    He has no cervical adenopathy.  Neurological: He is alert and oriented to person, place, and time. He has normal reflexes. He displays normal reflexes. No cranial nerve deficit. He exhibits normal muscle tone. Coordination normal.  Skin: Skin is warm and dry. No rash noted. He is not diaphoretic. No erythema. No pallor.  The upper chest has a linear scar which has a pink, non-tender smooth papular lesion at the inferior end  Psychiatric: He has a normal mood and affect. His behavior is normal. Judgment and thought content normal.          Assessment & Plan:  Well exam. We discussed diet and exercise. Set up fasting labs soon. He has some BPH symptoms so I suggested he try saw palmetto OTC for a few months. The skin lesion on the chest is worrisome for a possible recurrence of melanoma. He will see Dr. Jimmye Norman about this asap.  Alysia Penna, MD

## 2018-08-30 LAB — BASIC METABOLIC PANEL
BUN: 15 mg/dL (ref 6–23)
CALCIUM: 9.2 mg/dL (ref 8.4–10.5)
CO2: 29 meq/L (ref 19–32)
CREATININE: 1 mg/dL (ref 0.40–1.50)
Chloride: 104 mEq/L (ref 96–112)
GFR: 80.74 mL/min (ref >60.00)
Glucose, Bld: 89 mg/dL (ref 70–99)
Potassium: 4.5 mEq/L (ref 3.5–5.1)
Sodium: 140 mEq/L (ref 135–145)

## 2018-08-30 LAB — POC URINALSYSI DIPSTICK (AUTOMATED)
BILIRUBIN UA: NEGATIVE
Blood, UA: NEGATIVE
Glucose, UA: NEGATIVE
KETONES UA: NEGATIVE
LEUKOCYTES UA: NEGATIVE
Nitrite, UA: NEGATIVE
PH UA: 7.5 (ref 5.0–8.0)
Protein, UA: NEGATIVE
Spec Grav, UA: 1.015 (ref 1.010–1.025)
Urobilinogen, UA: 0.2 E.U./dL

## 2018-08-30 LAB — CBC WITH DIFFERENTIAL/PLATELET
Basophils Absolute: 0.1 10*3/uL (ref 0.0–0.1)
Basophils Relative: 1.3 % (ref 0.0–3.0)
Eosinophils Absolute: 0.5 10*3/uL (ref 0.0–0.7)
Eosinophils Relative: 8 % — ABNORMAL HIGH (ref 0.0–5.0)
HCT: 47.5 % (ref 39.0–52.0)
Hemoglobin: 16.1 g/dL (ref 13.0–17.0)
LYMPHS ABS: 1.6 10*3/uL (ref 0.7–4.0)
Lymphocytes Relative: 26.6 % (ref 12.0–46.0)
MCHC: 33.8 g/dL (ref 30.0–36.0)
MCV: 92.3 fl (ref 78.0–100.0)
MONOS PCT: 8.7 % (ref 3.0–12.0)
Monocytes Absolute: 0.5 10*3/uL (ref 0.1–1.0)
NEUTROS ABS: 3.3 10*3/uL (ref 1.4–7.7)
NEUTROS PCT: 55.4 % (ref 43.0–77.0)
Platelets: 306 10*3/uL (ref 150.0–400.0)
RBC: 5.15 Mil/uL (ref 4.22–5.81)
RDW: 13.7 % (ref 11.5–15.5)
WBC: 6 10*3/uL (ref 4.0–10.5)

## 2018-08-30 LAB — HEPATIC FUNCTION PANEL
ALT: 22 U/L (ref 0–53)
AST: 21 U/L (ref 0–37)
Albumin: 3.8 g/dL (ref 3.5–5.2)
Alkaline Phosphatase: 65 U/L (ref 39–117)
BILIRUBIN DIRECT: 0.2 mg/dL (ref 0.0–0.3)
TOTAL PROTEIN: 7.2 g/dL (ref 6.0–8.3)
Total Bilirubin: 0.9 mg/dL (ref 0.2–1.2)

## 2018-08-30 LAB — LIPID PANEL
Cholesterol: 116 mg/dL (ref 0–200)
HDL: 31.8 mg/dL — ABNORMAL LOW (ref >39.00)
LDL Cholesterol: 61 mg/dL (ref 0–99)
NonHDL: 84.63
Total CHOL/HDL Ratio: 4
Triglycerides: 120 mg/dL (ref 0.0–149.0)
VLDL: 24 mg/dL (ref 0.0–40.0)

## 2018-08-30 LAB — PSA: PSA: 1.08 ng/mL (ref 0.10–4.00)

## 2018-08-30 LAB — TSH: TSH: 2.46 u[IU]/mL (ref 0.35–4.50)

## 2018-08-30 NOTE — Addendum Note (Signed)
Addended by: Elmer Picker on: 08/30/2018 07:53 AM   Modules accepted: Orders

## 2018-08-31 ENCOUNTER — Encounter: Payer: Self-pay | Admitting: *Deleted

## 2019-06-11 ENCOUNTER — Ambulatory Visit: Payer: Managed Care, Other (non HMO) | Admitting: Internal Medicine

## 2019-06-11 ENCOUNTER — Encounter: Payer: Self-pay | Admitting: Internal Medicine

## 2019-06-11 ENCOUNTER — Other Ambulatory Visit: Payer: Self-pay

## 2019-06-11 DIAGNOSIS — G4733 Obstructive sleep apnea (adult) (pediatric): Secondary | ICD-10-CM

## 2019-06-11 DIAGNOSIS — F5101 Primary insomnia: Secondary | ICD-10-CM | POA: Diagnosis not present

## 2019-06-11 MED ORDER — TRAZODONE HCL 50 MG PO TABS: ORAL_TABLET | ORAL | 2 refills | Status: DC

## 2019-06-11 NOTE — Patient Instructions (Signed)
Script sent for trazodone to use at bedtime if needed for sleep  Our goal is to use CPAP at least 4 hours per night, on at least 6 nights per week.   Please call if we can help

## 2019-06-11 NOTE — Assessment & Plan Note (Signed)
He benefits from CPAP and pressure is good. Sleep hygiene not great.  Plan educated on compliance goals and sleep hygiene. Continue auto 5-20

## 2019-06-11 NOTE — Assessment & Plan Note (Signed)
Combination of primary insomnia and poor sleep environment- intrucion from grandchild. Discussed.  Plan trazodone to try if needed

## 2019-06-11 NOTE — Progress Notes (Signed)
HPI  male former smoker for sleep evaluation.referred by Dr Sarajane Jews for ? OSA, wakes up during the night. HST 12/14/17-AHI 37.9/hour, desaturation to 68%, body weight 225 pounds PFT 02/07/18- Mild restriction, mild Diffusion reduction, FVC 3.49/78%, FEV1 2.90/86%, ratio 0.83, FEF 25-75% 3.01/108%, No R to BD, TLC 77%, DLCO 77% ------------------------------------------------------------------------------------ 06/09/2018- 63 year old male former smoker followed for OSA, insomnia, asthmatic bronchitis CPAP auto 5-20/Advanced begun 01/02/18 -----He has been doing well but he has had sinus issues that causes him to not be able to stay on it on some nights.Breathing is about the same as last ov. Download 57% compliance AHI 0.1/hour.  Many short nights, some missed nights. We reviewed his download and discussed compliance goals. He complains nasal congestion interferes with CPAP.  Previous allergy skin testing has been positive for environmental allergens.  No history of polyps or nasal surgery.  06/11/2019- 63 year old male former smoker followed for OSA, insomnia, asthmatic bronchitis CPAP auto 5-20/[facility] begun 01/02/18 Download compliance 57%, AHI 0.1/ hr -----OSA on CPAP auto 5-20, DME: [facility]; pt states CPAP is working well for him, however, he still has trouble sleeping, states he goes to bed at 9 AM and gets up at 2 AM Body weight today- 224 lbs He reports hs about 9, wakes around 2AM. Sleep pften disturbed by grandchild coming in. If he stays in bed after waking gets "busy brain" and can't go back to sleep.  Summer mold at [place] bothers sinuses- has meds if needed.   ROS-see HPI   + = positive Constitutional:    weight loss, night sweats, fevers, chills, fatigue, lassitude. HEENT:    headaches, difficulty swallowing, tooth/dental problems, sore throat,       sneezing, itching, ear ache, +nasal congestion, post nasal drip, snoring CV:    chest pain, orthopnea, PND, swelling in lower extremities,  anasarca,                                                       dizziness, palpitations Resp:  + shortness of breath with exertion or at rest.               + productive cough,   non-productive cough, coughing up of blood.              change in color of mucus.  wheezing.   Skin:    rash or lesions. GI:     heartburn,+ indigestion, abdominal pain, nausea, vomiting, diarrhea,                 change in bowel habits, loss of appetite GU: dysuria, change in color of urine, no urgency or frequency.   flank pain. MS:   joint pain, stiffness, decreased range of motion, back pain. Neuro-     nothing unusual Psych:  change in mood or affect.  depression or anxiety.   memory loss.   OBJ- Physical Exam General- Alert, Oriented, Affect-appropriate, Distress- none acute,  + overweight Skin- rash-none, lesions- none, excoriation- none Lymphadenopathy- none Head- atraumatic            Eyes- Gross vision intact, PERRLA, conjunctivae and secretions clear            Ears- Hearing, canals-normal            Nose- Clear, no-Septal dev, mucus, polyps, erosion, perforation  Throat- Mallampati III , mucosa clear , drainage- none, tonsils- atrophic,  Neck- flexible , trachea midline, no stridor , thyroid nl, carotid no bruit Chest - symmetrical excursion , unlabored           Heart/CV- RRR , no murmur , no gallop  , no rub, nl s1 s2                           - JVD- none , edema- none, stasis changes- none, varices- none           Lung-clear, cough-none, dullness-none, rub- none           Chest wall-  Abd-  Br/ Gen/ Rectal- Not done, not indicated Extrem- cyanosis- none, clubbing, none, atrophy- none, strength- nl Neuro- grossly intact to observation

## 2019-08-15 ENCOUNTER — Other Ambulatory Visit: Payer: Self-pay | Admitting: Family Medicine

## 2019-08-27 ENCOUNTER — Other Ambulatory Visit: Payer: Self-pay | Admitting: Family Medicine

## 2019-08-27 NOTE — Telephone Encounter (Signed)
Copied from [place] [phone]. Topic: Quick Communication - Rx Refill/Question >> Aug 27, 2019  9:49 AM Yvette Rack wrote: Medication: Azelastine-Fluticasone (DYMISTA) 137-50 MCG/ACT SUSP  Has the patient contacted their pharmacy? no  Preferred Pharmacy (with phone number or street name): [facility], [place] [phone] (Phone)  [phone] (Fax)  Agent: Please be advised that RX refills may take up to 3 business days. We ask that you follow-up with your pharmacy.

## 2019-08-27 NOTE — Telephone Encounter (Signed)
Requested medication (s) are due for refill today:yes  Requested medication (s) are on the active medication list: yes  Last refill:  06/10/2019  Future visit scheduled: yes  Notes to clinic:  Review for refill   Requested Prescriptions  Pending Prescriptions Disp Refills   Azelastine-Fluticasone (DYMISTA) 137-50 MCG/ACT SUSP      Sig: Place 1-2 puffs into the nose at bedtime.     There is no refill protocol information for this order

## 2019-08-27 NOTE — Telephone Encounter (Signed)
Copied from [place] [phone]. Topic: Quick Communication - Rx Refill/Question >> Aug 27, 2019  9:49 AM Yvette Rack wrote: Medication: Azelastine-Fluticasone (DYMISTA) 137-50 MCG/ACT SUSP  Has the patient contacted their pharmacy? no  Preferred Pharmacy (with phone number or street name): [facility], [place] [phone] (Phone)  [phone] (Fax)  Agent: Please be advised that RX refills may take up to 3 business days. We ask that you follow-up with your pharmacy.

## 2019-08-28 MED ORDER — AZELASTINE-FLUTICASONE 137-50 MCG/ACT NA SUSP: 1.0000 | NASAL | 11 refills | Status: DC | PRN

## 2019-08-28 NOTE — Telephone Encounter (Signed)
Okay to fill? Rx was rx'd by an outside provider

## 2019-09-12 ENCOUNTER — Encounter: Payer: Self-pay | Admitting: Gastroenterology

## 2019-10-09 ENCOUNTER — Other Ambulatory Visit: Payer: Self-pay

## 2019-10-09 ENCOUNTER — Ambulatory Visit (INDEPENDENT_AMBULATORY_CARE_PROVIDER_SITE_OTHER): Payer: Managed Care, Other (non HMO) | Admitting: Family Medicine

## 2019-10-09 ENCOUNTER — Encounter: Payer: Self-pay | Admitting: Family Medicine

## 2019-10-09 VITALS — BP 120/60 | HR 71 | Temp 98.0°F | Ht 67.5 in | Wt 222.0 lb

## 2019-10-09 DIAGNOSIS — Z0000 Encounter for general adult medical examination without abnormal findings: Secondary | ICD-10-CM | POA: Diagnosis not present

## 2019-10-09 MED ORDER — TERBINAFINE HCL 250 MG PO TABS: 250.0000 mg | ORAL_TABLET | Freq: Every day | ORAL | 1 refills | Status: DC

## 2019-10-09 NOTE — Progress Notes (Signed)
   Subjective:    Patient ID: Michael Bernard, male    DOB: 1956/06/09, 63 y.o.   MRN: [id number]  HPI Here for a well exam. His only concern is discoloration of all his toenails, which started several years ago.    Review of Systems  Constitutional: Negative.   HENT: Negative.   Eyes: Negative.   Respiratory: Negative.   Cardiovascular: Negative.   Gastrointestinal: Negative.   Genitourinary: Negative.   Musculoskeletal: Negative.   Skin: Negative.   Neurological: Negative.   Psychiatric/Behavioral: Negative.        Objective:   Physical Exam Constitutional:      General: He is not in acute distress.    Appearance: He is well-developed. He is not diaphoretic.  HENT:     Head: Normocephalic and atraumatic.     Right Ear: External ear normal.     Left Ear: External ear normal.     Nose: Nose normal.     Mouth/Throat:     Pharynx: No oropharyngeal exudate.  Eyes:     General: No scleral icterus.       Right eye: No discharge.        Left eye: No discharge.     Conjunctiva/sclera: Conjunctivae normal.     Pupils: Pupils are equal, round, and reactive to light.  Neck:     Musculoskeletal: Neck supple.     Thyroid: No thyromegaly.     Vascular: No JVD.     Trachea: No tracheal deviation.  Cardiovascular:     Rate and Rhythm: Normal rate and regular rhythm.     Heart sounds: Normal heart sounds. No murmur. No friction rub. No gallop.   Pulmonary:     Effort: Pulmonary effort is normal. No respiratory distress.     Breath sounds: Normal breath sounds. No wheezing or rales.  Chest:     Chest wall: No tenderness.  Abdominal:     General: Bowel sounds are normal. There is no distension.     Palpations: Abdomen is soft. There is no mass.     Tenderness: There is no abdominal tenderness. There is no guarding or rebound.  Genitourinary:    Penis: Normal. No tenderness.      Scrotum/Testes: Normal.     Prostate: Normal.     Rectum: Normal. Guaiac result negative.   Musculoskeletal: Normal range of motion.        General: No tenderness.  Lymphadenopathy:     Cervical: No cervical adenopathy.  Skin:    General: Skin is warm and dry.     Coloration: Skin is not pale.     Findings: No erythema or rash.     Comments: All toenails show fungal involvement   Neurological:     Mental Status: He is alert and oriented to person, place, and time.     Cranial Nerves: No cranial nerve deficit.     Motor: No abnormal muscle tone.     Coordination: Coordination normal.     Deep Tendon Reflexes: Reflexes are normal and symmetric. Reflexes normal.  Psychiatric:        Behavior: Behavior normal.        Thought Content: Thought content normal.        Judgment: Judgment normal.           Assessment & Plan:  Well exam. We discussed diet and exercise. Get fasting labs soon. Treat the toenails with Terbinafine.  Alysia Penna, MD

## 2019-10-09 NOTE — Patient Instructions (Signed)
Health Maintenance Due  Topic Date Due  . Hepatitis C Screening  [birth year]/1956  . HIV Screening  08/28/1972  . TETANUS/TDAP  08/28/1976  . INFLUENZA VACCINE  06/23/2019    No flowsheet data found.

## 2019-10-11 ENCOUNTER — Other Ambulatory Visit: Payer: Self-pay

## 2019-10-11 ENCOUNTER — Other Ambulatory Visit (INDEPENDENT_AMBULATORY_CARE_PROVIDER_SITE_OTHER): Payer: Managed Care, Other (non HMO)

## 2019-10-11 DIAGNOSIS — Z0000 Encounter for general adult medical examination without abnormal findings: Secondary | ICD-10-CM | POA: Diagnosis not present

## 2019-10-11 LAB — HEPATIC FUNCTION PANEL
ALT: 21 U/L (ref 0–53)
AST: 19 U/L (ref 0–37)
Albumin: 4.1 g/dL (ref 3.5–5.2)
Alkaline Phosphatase: 73 U/L (ref 39–117)
Bilirubin, Direct: 0.2 mg/dL (ref 0.0–0.3)
Total Bilirubin: 1.4 mg/dL — ABNORMAL HIGH (ref 0.2–1.2)
Total Protein: 7.4 g/dL (ref 6.0–8.3)

## 2019-10-11 LAB — LIPID PANEL
Cholesterol: 120 mg/dL (ref 0–200)
HDL: 30.7 mg/dL — ABNORMAL LOW (ref >39.00)
LDL Cholesterol: 60 mg/dL (ref 0–99)
NonHDL: 89.37
Total CHOL/HDL Ratio: 4
Triglycerides: 145 mg/dL (ref 0.0–149.0)
VLDL: 29 mg/dL (ref 0.0–40.0)

## 2019-10-11 LAB — BASIC METABOLIC PANEL
BUN: 14 mg/dL (ref 6–23)
CO2: 30 mEq/L (ref 19–32)
Calcium: 9.6 mg/dL (ref 8.4–10.5)
Chloride: 101 mEq/L (ref 96–112)
Creatinine, Ser: 1.01 mg/dL (ref 0.40–1.50)
GFR: 74.82 mL/min (ref >60.00)
Glucose, Bld: 92 mg/dL (ref 70–99)
Potassium: 4.2 mEq/L (ref 3.5–5.1)
Sodium: 138 mEq/L (ref 135–145)

## 2019-10-11 LAB — CBC WITH DIFFERENTIAL/PLATELET
Basophils Absolute: 0.1 10*3/uL (ref 0.0–0.1)
Basophils Relative: 1.2 % (ref 0.0–3.0)
Eosinophils Absolute: 0.4 10*3/uL (ref 0.0–0.7)
Eosinophils Relative: 5.5 % — ABNORMAL HIGH (ref 0.0–5.0)
HCT: 50.2 % (ref 39.0–52.0)
Hemoglobin: 17 g/dL (ref 13.0–17.0)
Lymphocytes Relative: 26.9 % (ref 12.0–46.0)
Lymphs Abs: 1.8 10*3/uL (ref 0.7–4.0)
MCHC: 33.9 g/dL (ref 30.0–36.0)
MCV: 92.5 fl (ref 78.0–100.0)
Monocytes Absolute: 0.7 10*3/uL (ref 0.1–1.0)
Monocytes Relative: 10.2 % (ref 3.0–12.0)
Neutro Abs: 3.8 10*3/uL (ref 1.4–7.7)
Neutrophils Relative %: 56.2 % (ref 43.0–77.0)
Platelets: 326 10*3/uL (ref 150.0–400.0)
RBC: 5.43 Mil/uL (ref 4.22–5.81)
RDW: 13.7 % (ref 11.5–15.5)
WBC: 6.8 10*3/uL (ref 4.0–10.5)

## 2019-10-11 LAB — PSA: PSA: 1.34 ng/mL (ref 0.10–4.00)

## 2019-10-11 LAB — TSH: TSH: 3.53 u[IU]/mL (ref 0.35–4.50)

## 2020-04-08 DIAGNOSIS — E860 Dehydration: Secondary | ICD-10-CM | POA: Diagnosis not present

## 2020-04-08 DIAGNOSIS — R42 Dizziness and giddiness: Secondary | ICD-10-CM | POA: Diagnosis not present

## 2020-04-08 DIAGNOSIS — R55 Syncope and collapse: Secondary | ICD-10-CM | POA: Diagnosis not present

## 2020-04-08 DIAGNOSIS — Z87891 Personal history of nicotine dependence: Secondary | ICD-10-CM | POA: Diagnosis not present

## 2020-04-09 ENCOUNTER — Encounter: Payer: Self-pay | Admitting: Family Medicine

## 2020-04-09 ENCOUNTER — Other Ambulatory Visit: Payer: Self-pay

## 2020-04-09 ENCOUNTER — Ambulatory Visit (INDEPENDENT_AMBULATORY_CARE_PROVIDER_SITE_OTHER): Payer: BC Managed Care – PPO | Admitting: Family Medicine

## 2020-04-09 VITALS — BP 124/68 | HR 75 | Temp 97.8°F | Wt 236.6 lb

## 2020-04-09 DIAGNOSIS — R42 Dizziness and giddiness: Secondary | ICD-10-CM

## 2020-04-09 LAB — CBC WITH DIFFERENTIAL/PLATELET
Basophils Absolute: 0.1 10*3/uL (ref 0.0–0.1)
Basophils Relative: 1 % (ref 0.0–3.0)
Eosinophils Absolute: 0.3 10*3/uL (ref 0.0–0.7)
Eosinophils Relative: 4 % (ref 0.0–5.0)
HCT: 46.8 % (ref 39.0–52.0)
Hemoglobin: 15.9 g/dL (ref 13.0–17.0)
Lymphocytes Relative: 27 % (ref 12.0–46.0)
Lymphs Abs: 2.3 10*3/uL (ref 0.7–4.0)
MCHC: 34.1 g/dL (ref 30.0–36.0)
MCV: 92.6 fl (ref 78.0–100.0)
Monocytes Absolute: 0.8 10*3/uL (ref 0.1–1.0)
Monocytes Relative: 9.6 % (ref 3.0–12.0)
Neutro Abs: 4.9 10*3/uL (ref 1.4–7.7)
Neutrophils Relative %: 58.4 % (ref 43.0–77.0)
Platelets: 336 10*3/uL (ref 150.0–400.0)
RBC: 5.05 Mil/uL (ref 4.22–5.81)
RDW: 13.9 % (ref 11.5–15.5)
WBC: 8.4 10*3/uL (ref 4.0–10.5)

## 2020-04-09 LAB — BASIC METABOLIC PANEL
BUN: 16 mg/dL (ref 6–23)
CO2: 29 mEq/L (ref 19–32)
Calcium: 9.3 mg/dL (ref 8.4–10.5)
Chloride: 103 mEq/L (ref 96–112)
Creatinine, Ser: 1.05 mg/dL (ref 0.40–1.50)
GFR: 71.43 mL/min (ref >60.00)
Glucose, Bld: 92 mg/dL (ref 70–99)
Potassium: 4.1 mEq/L (ref 3.5–5.1)
Sodium: 139 mEq/L (ref 135–145)

## 2020-04-09 MED ORDER — MECLIZINE HCL 25 MG PO TABS: 25.0000 mg | ORAL_TABLET | ORAL | 2 refills | Status: DC | PRN

## 2020-04-09 NOTE — Progress Notes (Signed)
   Subjective:    Patient ID: Michael Bernard, male    DOB: [birth year], 64 y.o.   MRN: [id number]  HPI Here for 24 hours of dizziness. He has never had this before. He says that 2 days ago he spent 3 hours at his dentist's office having some crown work done. Of course to do this he was laid back at an angle in the dentist's chair. He tolerated this well. However yesterday he spent several hours lying on his back under a tractor reaching up over his head doing some mechanical repairs. During this time he had to get up and down numerous times to get tools, and as he did so he began to get dizzy. The dizziness got worse and worse until one time the room began spinning and he felt very sick . He could not stand up so he lay on the garage floor for awhile. He became very nauseated and he vomited once. No headache or blurred vision or sluured speech. No other neurologic deficits. He was able to call EMS and they took him to [facility] ER. His labs and EKG were normal. They told him her was dehydrated and gave him some IV fluid. His wife drove him home. He has continued to be dizzy since then, especially when he gets up and down or moves his head quickly. He has not been as bad as yesterday fortunately.    Review of Systems  Constitutional: Negative.   Eyes: Negative for visual disturbance.  Respiratory: Negative.   Cardiovascular: Negative.   Neurological: Positive for dizziness. Negative for tremors, seizures, syncope, facial asymmetry, speech difficulty, weakness, light-headedness, numbness and headaches.       Objective:   Physical Exam Constitutional:      Appearance: Normal appearance. He is not ill-appearing.  HENT:     Head: Normocephalic and atraumatic.     Right Ear: Tympanic membrane, ear canal and external ear normal.     Left Ear: Tympanic membrane, ear canal and external ear normal.     Nose: Nose normal.     Mouth/Throat:     Pharynx: Oropharynx is clear.  Eyes:     Extraocular  Movements: Extraocular movements intact.     Conjunctiva/sclera: Conjunctivae normal.     Pupils: Pupils are equal, round, and reactive to light.  Neck:     Vascular: No carotid bruit.  Cardiovascular:     Rate and Rhythm: Normal rate and regular rhythm.     Pulses: Normal pulses.     Heart sounds: Normal heart sounds.  Pulmonary:     Effort: Pulmonary effort is normal.     Breath sounds: Normal breath sounds.  Musculoskeletal:     Cervical back: No rigidity.  Lymphadenopathy:     Cervical: No cervical adenopathy.  Neurological:     General: No focal deficit present.     Mental Status: He is alert and oriented to person, place, and time.     Cranial Nerves: No cranial nerve deficit.     Sensory: No sensory deficit.     Motor: No weakness.     Coordination: Coordination normal.     Gait: Gait normal.           Assessment & Plan:  Vertigo, likely due to vestibular dysfunction. Get labs today. He can use Meclizine as needed. Less likely would be stroke. We will set up a CT angiogram of the head and neck soon.  Alysia Penna, MD

## 2020-04-25 ENCOUNTER — Ambulatory Visit
Admission: RE | Admit: 2020-04-25 | Discharge: 2020-04-25 | Disposition: A | Discharge status: Home / Self Care | Payer: BC Managed Care – PPO | Source: Ambulatory Visit | Attending: Family Medicine | Admitting: Family Medicine

## 2020-04-25 DIAGNOSIS — R42 Dizziness and giddiness: Secondary | ICD-10-CM

## 2020-04-25 MED ORDER — IOPAMIDOL (ISOVUE-370) INJECTION 76%
75.0000 mL | Freq: Once | INTRAVENOUS | Status: AC | PRN
  Administered 2020-04-25: 75 mL via INTRAVENOUS

## 2020-04-28 DIAGNOSIS — D2239 Melanocytic nevi of other parts of face: Secondary | ICD-10-CM | POA: Diagnosis not present

## 2020-04-28 DIAGNOSIS — L570 Actinic keratosis: Secondary | ICD-10-CM | POA: Diagnosis not present

## 2020-04-28 DIAGNOSIS — Z85820 Personal history of malignant melanoma of skin: Secondary | ICD-10-CM | POA: Diagnosis not present

## 2020-04-28 DIAGNOSIS — L821 Other seborrheic keratosis: Secondary | ICD-10-CM | POA: Diagnosis not present

## 2020-04-28 DIAGNOSIS — D225 Melanocytic nevi of trunk: Secondary | ICD-10-CM | POA: Diagnosis not present

## 2020-06-11 ENCOUNTER — Ambulatory Visit: Payer: Managed Care, Other (non HMO) | Admitting: Internal Medicine

## 2020-07-23 ENCOUNTER — Telehealth: Payer: Self-pay | Admitting: Family Medicine

## 2020-07-23 MED ORDER — OMEPRAZOLE 40 MG PO CPDR: DELAYED_RELEASE_CAPSULE | ORAL | 0 refills | Status: DC

## 2020-07-23 NOTE — Telephone Encounter (Signed)
Pt stated his pharmacy told him to reach out for this refill b/c they have not heard anything back. Pt has 9 days left  Medication: Omeprazole  Pharmacy:  [facility], [place] Phone:  [phone]  Fax:  [phone]

## 2020-10-14 ENCOUNTER — Telehealth: Payer: Self-pay

## 2020-10-14 ENCOUNTER — Ambulatory Visit (INDEPENDENT_AMBULATORY_CARE_PROVIDER_SITE_OTHER): Payer: BC Managed Care – PPO | Admitting: Family Medicine

## 2020-10-14 ENCOUNTER — Other Ambulatory Visit: Payer: Self-pay

## 2020-10-14 ENCOUNTER — Encounter: Payer: Self-pay | Admitting: Family Medicine

## 2020-10-14 VITALS — BP 118/72 | HR 67 | Temp 98.0°F | Ht 68.0 in | Wt 231.4 lb

## 2020-10-14 DIAGNOSIS — Z0000 Encounter for general adult medical examination without abnormal findings: Secondary | ICD-10-CM

## 2020-10-14 LAB — LIPID PANEL
Cholesterol: 121 mg/dL (ref <200)
HDL: 34 mg/dL — ABNORMAL LOW (ref >=40)
LDL Cholesterol (Calc): 64 mg/dL (calc)
Non-HDL Cholesterol (Calc): 87 mg/dL (calc) (ref <130)
Total CHOL/HDL Ratio: 3.6 (calc) (ref <5.0)
Triglycerides: 148 mg/dL (ref <150)

## 2020-10-14 LAB — HEPATIC FUNCTION PANEL
AG Ratio: 1.1 (calc) (ref 1.0–2.5)
ALT: 26 U/L (ref 9–46)
AST: 22 U/L (ref 10–35)
Albumin: 4 g/dL (ref 3.6–5.1)
Alkaline phosphatase (APISO): 77 U/L (ref 35–144)
Bilirubin, Direct: 0.2 mg/dL (ref 0.0–0.2)
Globulin: 3.5 g/dL (calc) (ref 1.9–3.7)
Indirect Bilirubin: 0.6 mg/dL (calc) (ref 0.2–1.2)
Total Bilirubin: 0.8 mg/dL (ref 0.2–1.2)
Total Protein: 7.5 g/dL (ref 6.1–8.1)

## 2020-10-14 LAB — BASIC METABOLIC PANEL WITH GFR
BUN: 13 mg/dL (ref 7–25)
CO2: 27 mmol/L (ref 20–32)
Calcium: 9.5 mg/dL (ref 8.6–10.3)
Chloride: 101 mmol/L (ref 98–110)
Creat: 0.97 mg/dL (ref 0.70–1.25)
GFR, Est African American: 96 mL/min/{1.73_m2} (ref >=60)
GFR, Est Non African American: 83 mL/min/{1.73_m2} (ref >=60)
Glucose, Bld: 83 mg/dL (ref 65–99)
Potassium: 4.6 mmol/L (ref 3.5–5.3)
Sodium: 137 mmol/L (ref 135–146)

## 2020-10-14 LAB — TSH: TSH: 3.61 mIU/L (ref 0.40–4.50)

## 2020-10-14 LAB — PSA: PSA: 1.49 ng/mL (ref <=4.0)

## 2020-10-14 NOTE — Telephone Encounter (Signed)
I reordered them all

## 2020-10-14 NOTE — Progress Notes (Signed)
Subjective:    Patient ID: Michael Bernard, male    DOB: [birth year], 64 y.o.   MRN: [id number]  HPI Here for a well exam. He feels well except he has trouble sleeping. He uses a CPAP, but he still wakes up frequently during the night. He tried a sleep medication a few years ago, but he stopped it because it made him feel jung over the next morning. He doe snot remember the name of this. He has tried melatonin OTC also with no relief.    Review of Systems  Constitutional: Negative.   HENT: Negative.   Eyes: Negative.   Respiratory: Negative.   Cardiovascular: Negative.   Gastrointestinal: Negative.   Genitourinary: Negative.   Musculoskeletal: Negative.   Skin: Negative.   Neurological: Negative.   Psychiatric/Behavioral: Negative.        Objective:   Physical Exam Constitutional:      General: He is not in acute distress.    Appearance: He is well-developed. He is obese. He is not diaphoretic.  HENT:     Head: Normocephalic and atraumatic.     Right Ear: External ear normal.     Left Ear: External ear normal.     Nose: Nose normal.     Mouth/Throat:     Pharynx: No oropharyngeal exudate.  Eyes:     General: No scleral icterus.       Right eye: No discharge.        Left eye: No discharge.     Conjunctiva/sclera: Conjunctivae normal.     Pupils: Pupils are equal, round, and reactive to light.  Neck:     Thyroid: No thyromegaly.     Vascular: No JVD.     Trachea: No tracheal deviation.  Cardiovascular:     Rate and Rhythm: Normal rate and regular rhythm.     Heart sounds: Normal heart sounds. No murmur heard.  No friction rub. No gallop.   Pulmonary:     Effort: Pulmonary effort is normal. No respiratory distress.     Breath sounds: Normal breath sounds. No wheezing or rales.  Chest:     Chest wall: No tenderness.  Abdominal:     General: Bowel sounds are normal. There is no distension.     Palpations: Abdomen is soft. There is no mass.     Tenderness: There is no  abdominal tenderness. There is no guarding or rebound.  Genitourinary:    Penis: Normal. No tenderness.      Testes: Normal.     Prostate: Normal.     Rectum: Normal. Guaiac result negative.  Musculoskeletal:        General: No tenderness. Normal range of motion.     Cervical back: Neck supple.  Lymphadenopathy:     Cervical: No cervical adenopathy.  Skin:    General: Skin is warm and dry.     Coloration: Skin is not pale.     Findings: No erythema or rash.  Neurological:     Mental Status: He is alert and oriented to person, place, and time.     Cranial Nerves: No cranial nerve deficit.     Motor: No abnormal muscle tone.     Coordination: Coordination normal.     Deep Tendon Reflexes: Reflexes are normal and symmetric. Reflexes normal.  Psychiatric:        Behavior: Behavior normal.        Thought Content: Thought content normal.        Judgment: Judgment normal.  Assessment & Plan:  Well exam. We discussed diet and exercise. Get fasting labs. He will let us know the name of the sleeping medication he had tried. After that we plan to let him try something else.  Alysia Penna, MD

## 2020-10-14 NOTE — Addendum Note (Signed)
Addended by: Janann Colonel on: 10/14/2020 11:41 AM   Modules accepted: Orders

## 2020-10-14 NOTE — Addendum Note (Signed)
Addended by: Alysia Penna A on: 10/14/2020 11:22 AM   Modules accepted: Orders

## 2020-10-14 NOTE — Telephone Encounter (Signed)
Can you cancel and redorder the following labs on this patient. Lab was unable to draw since he was fasting, dehydrated, and almost passed out. Test numbers: [id number] diagnosis code Z00.00  Thanks.   Dm/cma

## 2020-10-20 ENCOUNTER — Telehealth: Payer: Self-pay | Admitting: Family Medicine

## 2020-10-20 ENCOUNTER — Other Ambulatory Visit: Payer: Self-pay | Admitting: Family Medicine

## 2020-10-20 MED ORDER — OMEPRAZOLE 40 MG PO CPDR: DELAYED_RELEASE_CAPSULE | ORAL | 2 refills | Status: DC

## 2020-10-20 MED ORDER — TEMAZEPAM 15 MG PO CAPS
15.0000 mg | ORAL_CAPSULE | Freq: Every evening | ORAL | 2 refills | Status: DC | PRN
Start: 2020-10-20 — End: 2021-08-24

## 2020-10-20 NOTE — Telephone Encounter (Signed)
omeprazole (PRILOSEC) 40 MG capsule  [address], [place] Phone:  [phone]  Fax:  [phone]

## 2020-10-20 NOTE — Telephone Encounter (Signed)
Noted. I will send him in some Temazepam to try for sleep instead

## 2020-10-20 NOTE — Addendum Note (Signed)
Addended by: Alysia Penna A on: 10/20/2020 04:57 PM   Modules accepted: Orders

## 2020-10-20 NOTE — Telephone Encounter (Signed)
Patient discussed with Dr. Sarajane Jews at last visit about sleeping medication but was not able to remember the name of the medication. He called to let Dr. Sarajane Jews know that the name of the sleep medication is Trazodone 50 mg.  Please advise

## 2020-10-20 NOTE — Telephone Encounter (Signed)
I have the patient scheduled to come in for labs 11/30 at [facility]

## 2020-10-20 NOTE — Telephone Encounter (Signed)
Rx sent in

## 2020-10-20 NOTE — Telephone Encounter (Signed)
Noted  

## 2020-10-20 NOTE — Addendum Note (Signed)
Addended by: Rodrigo Ran on: 10/20/2020 09:17 AM   Modules accepted: Orders

## 2020-10-20 NOTE — Telephone Encounter (Signed)
Done

## 2020-10-20 NOTE — Telephone Encounter (Signed)
I called and spoke with pt, he is aware that Rx has been sent in.

## 2020-10-21 ENCOUNTER — Other Ambulatory Visit (INDEPENDENT_AMBULATORY_CARE_PROVIDER_SITE_OTHER): Payer: BC Managed Care – PPO

## 2020-10-21 ENCOUNTER — Other Ambulatory Visit: Payer: Self-pay

## 2020-10-21 DIAGNOSIS — Z0000 Encounter for general adult medical examination without abnormal findings: Secondary | ICD-10-CM | POA: Diagnosis not present

## 2020-10-21 LAB — CBC WITH DIFFERENTIAL/PLATELET
Basophils Absolute: 0.1 10*3/uL (ref 0.0–0.1)
Basophils Relative: 1.1 % (ref 0.0–3.0)
Eosinophils Absolute: 0.5 10*3/uL (ref 0.0–0.7)
Eosinophils Relative: 7.1 % — ABNORMAL HIGH (ref 0.0–5.0)
HCT: 49.4 % (ref 39.0–52.0)
Hemoglobin: 16.7 g/dL (ref 13.0–17.0)
Lymphocytes Relative: 27.7 % (ref 12.0–46.0)
Lymphs Abs: 1.9 10*3/uL (ref 0.7–4.0)
MCHC: 33.8 g/dL (ref 30.0–36.0)
MCV: 93.1 fl (ref 78.0–100.0)
Monocytes Absolute: 0.6 10*3/uL (ref 0.1–1.0)
Monocytes Relative: 8.5 % (ref 3.0–12.0)
Neutro Abs: 3.8 10*3/uL (ref 1.4–7.7)
Neutrophils Relative %: 55.6 % (ref 43.0–77.0)
Platelets: 330 10*3/uL (ref 150.0–400.0)
RBC: 5.31 Mil/uL (ref 4.22–5.81)
RDW: 13.6 % (ref 11.5–15.5)
WBC: 6.8 10*3/uL (ref 4.0–10.5)

## 2020-10-21 NOTE — Addendum Note (Signed)
Addended by: Marrion Coy on: 10/21/2020 07:57 AM   Modules accepted: Orders

## 2020-11-28 DIAGNOSIS — Z20828 Contact with and (suspected) exposure to other viral communicable diseases: Secondary | ICD-10-CM | POA: Diagnosis not present

## 2020-12-02 ENCOUNTER — Telehealth (INDEPENDENT_AMBULATORY_CARE_PROVIDER_SITE_OTHER): Payer: BC Managed Care – PPO | Admitting: Family Medicine

## 2020-12-02 ENCOUNTER — Telehealth: Payer: Self-pay | Admitting: Family Medicine

## 2020-12-02 ENCOUNTER — Encounter: Payer: Self-pay | Admitting: Family Medicine

## 2020-12-02 VITALS — Temp 99.7°F

## 2020-12-02 DIAGNOSIS — J40 Bronchitis, not specified as acute or chronic: Secondary | ICD-10-CM

## 2020-12-02 MED ORDER — LEVOFLOXACIN 500 MG PO TABS
500.0000 mg | ORAL_TABLET | Freq: Every day | ORAL | 0 refills | Status: AC
Start: 2020-12-02 — End: 2020-12-12

## 2020-12-02 MED ORDER — HYDROCODONE-HOMATROPINE 5-1.5 MG/5ML PO SYRP: 5.0000 mL | ORAL_SOLUTION | ORAL | 0 refills | Status: DC | PRN

## 2020-12-02 NOTE — Telephone Encounter (Signed)
Pharmacy has been updated in chart.

## 2020-12-02 NOTE — Progress Notes (Signed)
Subjective:    Patient ID: Michael Bernard, male    DOB: 1956-11-04, 65 y.o.   MRN: [id number]  HPI Virtual Visit via Video Note  I connected with the patient on 12/02/20 at  9:30 AM EST by a video enabled telemedicine application and verified that I am speaking with the correct person using two identifiers.  Location patient: home Location provider:work or home office Persons participating in the virtual visit: patient, provider  I discussed the limitations of evaluation and management by telemedicine and the availability of in person appointments. The patient expressed understanding and agreed to proceed.   HPI: Here for what is likely a Covid-19 infection. He and his wife both developed the same symptoms about 6 days ago, including fever to 99 degrees, chest congestion, coughing up green sputum, and ST. No body aches or NVD. No chest pain or SOB. They both went to an urgent care 3 days ago and got tested for the Covid virus. His wife tested positive, while he tested negative.    ROS: See pertinent positives and negatives per HPI.  Past Medical History:  Diagnosis Date  . Allergy   . Cancer ([place])   . GERD (gastroesophageal reflux disease)    sees Dr. Lyndel Safe in [place]   . Hiatal hernia   . Kidney stones   . Multiple gastric ulcers 2015   seen on endoscopy per Dr. Lyndel Safe in [place]   . Sleep apnea    sees Dr. Keturah Barre, uses CPAP     Past Surgical History:  Procedure Laterality Date  . COLONOSCOPY  2015   per Dr. Lyndel Safe in [place], benign polyps, repeat in 7 yrs   . HERNIA REPAIR Bilateral    both inguinal hernias repaired with mesh   . MELANOMA EXCISION     per Dr. Jimmye Norman in [place], 3 removed from both shoulders and left temple     Family History  Problem Relation Age of Onset  . Arthritis Mother   . Cancer Mother   . Hyperlipidemia Mother   . Hypertension Mother   . Leukemia Mother   . Atrial fibrillation Mother   . Heart disease Father   . Hypertension  Father   . Seizures Sister      Current Outpatient Medications:  .  HYDROcodone-homatropine (HYCODAN) 5-1.5 MG/5ML syrup, Take 5 mLs by mouth every 4 (four) hours as needed for cough., Disp: 240 mL, Rfl: 0 .  levofloxacin (LEVAQUIN) 500 MG tablet, Take 1 tablet (500 mg total) by mouth daily for 10 days., Disp: 10 tablet, Rfl: 0 .  Azelastine-Fluticasone (DYMISTA) 137-50 MCG/ACT SUSP, Place 1-2 puffs into the nose as needed., Disp: 23 g, Rfl: 11 .  meclizine (ANTIVERT) 25 MG tablet, Take 1 tablet (25 mg total) by mouth every 4 (four) hours as needed for dizziness. (Patient not taking: Reported on 10/14/2020), Disp: 60 tablet, Rfl: 2 .  omeprazole (PRILOSEC) 40 MG capsule, TAKE ONE CAPSULE (40 MG TOTAL) BY MOUTH DAILY, Disp: 90 capsule, Rfl: 2 .  temazepam (RESTORIL) 15 MG capsule, Take 1 capsule (15 mg total) by mouth at bedtime as needed for sleep., Disp: 30 capsule, Rfl: 2  EXAM:  VITALS per patient if applicable:  GENERAL: alert, oriented, appears well and in no acute distress  HEENT: atraumatic, conjunttiva clear, no obvious abnormalities on inspection of external nose and ears  NECK: normal movements of the head and neck  LUNGS: on inspection no signs of respiratory distress, breathing rate appears normal, no obvious gross  SOB, gasping or wheezing  CV: no obvious cyanosis  MS: moves all visible extremities without noticeable abnormality  PSYCH/NEURO: pleasant and cooperative, no obvious depression or anxiety, speech and thought processing grossly intact  ASSESSMENT AND PLAN: He has a bronchitis that is likely due to a Covid-19 infection. He likely had a false negative test. He will quarantine at home for a total of 10 days. Treat with Levaquin and Hydromet syrup.  Alysia Penna, MD  Discussed the following assessment and plan:  No diagnosis found.     I discussed the assessment and treatment plan with the patient. The patient was provided an opportunity to ask questions and  all were answered. The patient agreed with the plan and demonstrated an understanding of the instructions.   The patient was advised to call back or seek an in-person evaluation if the symptoms worsen or if the condition fails to improve as anticipated.     Review of Systems     Objective:   Physical Exam        Assessment & Plan:

## 2020-12-02 NOTE — Telephone Encounter (Signed)
Pt call and stated that [facility] didn't have the cough syrup but [facility] [address] have it and want to know can you sent it to that drug store.

## 2020-12-03 MED ORDER — HYDROCODONE-HOMATROPINE 5-1.5 MG/5ML PO SYRP: 5.0000 mL | ORAL_SOLUTION | ORAL | 0 refills | Status: DC | PRN

## 2020-12-03 NOTE — Telephone Encounter (Signed)
Done

## 2020-12-15 ENCOUNTER — Telehealth: Payer: Self-pay | Admitting: Family Medicine

## 2020-12-15 NOTE — Telephone Encounter (Signed)
Pt is calling in stating that he can not find the cough syrup (Hydrocodone-homatropine (HYCODAN) 5-1.5 MG  that was prescribed to him for his cough a week ago and would like to see if he can get something else (and ask if we could ask the pharmacy if they have it in stock).   [facility]arm[facility] [address] in [place], [place]

## 2020-12-15 NOTE — Telephone Encounter (Signed)
Please advise. Pt was prescribe med on 12/03/20

## 2020-12-16 MED ORDER — BENZONATATE 200 MG PO CAPS
200.0000 mg | ORAL_CAPSULE | Freq: Four times a day (QID) | ORAL | 0 refills | Status: DC | PRN
Start: 2020-12-16 — End: 2021-07-22

## 2020-12-16 NOTE — Telephone Encounter (Signed)
I sent in Benzonatate pills  °

## 2020-12-16 NOTE — Telephone Encounter (Signed)
Called pt to inform him benzonatate pills were  [facility]

## 2021-04-28 DIAGNOSIS — L814 Other melanin hyperpigmentation: Secondary | ICD-10-CM | POA: Diagnosis not present

## 2021-04-28 DIAGNOSIS — D2239 Melanocytic nevi of other parts of face: Secondary | ICD-10-CM | POA: Diagnosis not present

## 2021-04-28 DIAGNOSIS — L570 Actinic keratosis: Secondary | ICD-10-CM | POA: Diagnosis not present

## 2021-04-28 DIAGNOSIS — D225 Melanocytic nevi of trunk: Secondary | ICD-10-CM | POA: Diagnosis not present

## 2021-04-28 DIAGNOSIS — L821 Other seborrheic keratosis: Secondary | ICD-10-CM | POA: Diagnosis not present

## 2021-05-21 ENCOUNTER — Other Ambulatory Visit: Payer: Self-pay

## 2021-05-21 ENCOUNTER — Encounter (HOSPITAL_COMMUNITY): Payer: Self-pay | Admitting: Emergency Medicine

## 2021-05-21 ENCOUNTER — Emergency Department (HOSPITAL_COMMUNITY): Payer: BC Managed Care – PPO

## 2021-05-21 ENCOUNTER — Observation Stay (HOSPITAL_COMMUNITY)
Admission: EM | Admit: 2021-05-21 | Discharge: 2021-05-22 | Disposition: A | Discharge status: Home / Self Care | Payer: BC Managed Care – PPO | Attending: Cardiovascular Disease | Admitting: Cardiovascular Disease

## 2021-05-21 DIAGNOSIS — R079 Chest pain, unspecified: Secondary | ICD-10-CM

## 2021-05-21 DIAGNOSIS — Z20822 Contact with and (suspected) exposure to covid-19: Secondary | ICD-10-CM | POA: Insufficient documentation

## 2021-05-21 DIAGNOSIS — Z87891 Personal history of nicotine dependence: Secondary | ICD-10-CM | POA: Insufficient documentation

## 2021-05-21 DIAGNOSIS — K219 Gastro-esophageal reflux disease without esophagitis: Secondary | ICD-10-CM | POA: Diagnosis present

## 2021-05-21 DIAGNOSIS — G4733 Obstructive sleep apnea (adult) (pediatric): Secondary | ICD-10-CM

## 2021-05-21 DIAGNOSIS — I25110 Atherosclerotic heart disease of native coronary artery with unstable angina pectoris: Principal | ICD-10-CM | POA: Insufficient documentation

## 2021-05-21 DIAGNOSIS — E6609 Other obesity due to excess calories: Secondary | ICD-10-CM | POA: Diagnosis not present

## 2021-05-21 DIAGNOSIS — I10 Essential (primary) hypertension: Secondary | ICD-10-CM | POA: Insufficient documentation

## 2021-05-21 DIAGNOSIS — R0602 Shortness of breath: Secondary | ICD-10-CM | POA: Diagnosis not present

## 2021-05-21 DIAGNOSIS — I200 Unstable angina: Secondary | ICD-10-CM | POA: Diagnosis not present

## 2021-05-21 DIAGNOSIS — Z85820 Personal history of malignant melanoma of skin: Secondary | ICD-10-CM | POA: Diagnosis not present

## 2021-05-21 DIAGNOSIS — R0789 Other chest pain: Secondary | ICD-10-CM | POA: Diagnosis not present

## 2021-05-21 LAB — HEPATIC FUNCTION PANEL
ALT: 36 U/L (ref 0–44)
AST: 30 U/L (ref 15–41)
Albumin: 3.7 g/dL (ref 3.5–5.0)
Alkaline Phosphatase: 68 U/L (ref 38–126)
Bilirubin, Direct: 0.2 mg/dL (ref 0.0–0.2)
Indirect Bilirubin: 1.1 mg/dL — ABNORMAL HIGH (ref 0.3–0.9)
Total Bilirubin: 1.3 mg/dL — ABNORMAL HIGH (ref 0.3–1.2)
Total Protein: 7.3 g/dL (ref 6.5–8.1)

## 2021-05-21 LAB — CBC
HCT: 50.5 % (ref 39.0–52.0)
Hemoglobin: 16.4 g/dL (ref 13.0–17.0)
MCH: 30.8 pg (ref 26.0–34.0)
MCHC: 32.5 g/dL (ref 30.0–36.0)
MCV: 94.9 fL (ref 80.0–100.0)
Platelets: 335 10*3/uL (ref 150–400)
RBC: 5.32 MIL/uL (ref 4.22–5.81)
RDW: 13.2 % (ref 11.5–15.5)
WBC: 9.5 10*3/uL (ref 4.0–10.5)
nRBC: 0 % (ref 0.0–0.2)

## 2021-05-21 LAB — BASIC METABOLIC PANEL
Anion gap: 6 (ref 5–15)
BUN: 10 mg/dL (ref 8–23)
CO2: 27 mmol/L (ref 22–32)
Calcium: 9.2 mg/dL (ref 8.9–10.3)
Chloride: 105 mmol/L (ref 98–111)
Creatinine, Ser: 1.1 mg/dL (ref 0.61–1.24)
GFR, Estimated: >60 mL/min (ref >60)
Glucose, Bld: 91 mg/dL (ref 70–99)
Potassium: 4 mmol/L (ref 3.5–5.1)
Sodium: 138 mmol/L (ref 135–145)

## 2021-05-21 LAB — DIFFERENTIAL
Abs Immature Granulocytes: 0.03 10*3/uL (ref 0.00–0.07)
Basophils Absolute: 0.1 10*3/uL (ref 0.0–0.1)
Basophils Relative: 1 %
Eosinophils Absolute: 0.6 10*3/uL — ABNORMAL HIGH (ref 0.0–0.5)
Eosinophils Relative: 6 %
Immature Granulocytes: 0 %
Lymphocytes Relative: 21 %
Lymphs Abs: 2 10*3/uL (ref 0.7–4.0)
Monocytes Absolute: 0.8 10*3/uL (ref 0.1–1.0)
Monocytes Relative: 8 %
Neutro Abs: 5.9 10*3/uL (ref 1.7–7.7)
Neutrophils Relative %: 64 %

## 2021-05-21 LAB — TROPONIN I (HIGH SENSITIVITY)
Troponin I (High Sensitivity): 3 ng/L (ref <18)
Troponin I (High Sensitivity): 4 ng/L (ref <18)

## 2021-05-21 MED ORDER — SODIUM CHLORIDE 0.9% FLUSH
3.0000 mL | Freq: Two times a day (BID) | INTRAVENOUS | Status: DC
Start: 2021-05-21 — End: 2021-05-22
  Administered 2021-05-22: 3 mL via INTRAVENOUS

## 2021-05-21 MED ORDER — ZOLPIDEM TARTRATE 5 MG PO TABS: 5.0000 mg | ORAL_TABLET | Freq: Every evening | ORAL | Status: DC | PRN

## 2021-05-21 MED ORDER — SODIUM CHLORIDE 0.9 % WEIGHT BASED INFUSION
1.0000 mL/kg/h | INTRAVENOUS | Status: DC
  Administered 2021-05-22: 1 mL/kg/h via INTRAVENOUS

## 2021-05-21 MED ORDER — ACETAMINOPHEN 325 MG PO TABS: 650.0000 mg | ORAL_TABLET | ORAL | Status: DC | PRN

## 2021-05-21 MED ORDER — SODIUM CHLORIDE 0.9% FLUSH: 3.0000 mL | INTRAVENOUS | Status: DC | PRN

## 2021-05-21 MED ORDER — ASPIRIN 81 MG PO CHEW
81.0000 mg | CHEWABLE_TABLET | ORAL | Status: AC
  Administered 2021-05-22: 81 mg via ORAL
  Filled 2021-05-21: qty 1

## 2021-05-21 MED ORDER — SODIUM CHLORIDE 0.9 % WEIGHT BASED INFUSION: 3.0000 mL/kg/h | INTRAVENOUS | Status: AC

## 2021-05-21 MED ORDER — NITROGLYCERIN 0.4 MG SL SUBL: 0.4000 mg | SUBLINGUAL_TABLET | SUBLINGUAL | Status: DC | PRN

## 2021-05-21 MED ORDER — ENOXAPARIN SODIUM 40 MG/0.4ML IJ SOSY
40.0000 mg | PREFILLED_SYRINGE | Freq: Every day | INTRAMUSCULAR | Status: DC
  Administered 2021-05-22: 40 mg via SUBCUTANEOUS
  Filled 2021-05-21: qty 0.4

## 2021-05-21 MED ORDER — ONDANSETRON HCL 4 MG/2ML IJ SOLN: 4.0000 mg | Freq: Four times a day (QID) | INTRAMUSCULAR | Status: DC | PRN

## 2021-05-21 MED ORDER — ALPRAZOLAM 0.25 MG PO TABS: 0.2500 mg | ORAL_TABLET | Freq: Two times a day (BID) | ORAL | Status: DC | PRN

## 2021-05-21 MED ORDER — SODIUM CHLORIDE 0.9 % IV SOLN: 250.0000 mL | INTRAVENOUS | Status: DC | PRN

## 2021-05-21 MED ORDER — SODIUM CHLORIDE 0.9% FLUSH
3.0000 mL | Freq: Two times a day (BID) | INTRAVENOUS | Status: DC
  Administered 2021-05-22: 3 mL via INTRAVENOUS

## 2021-05-21 MED ORDER — ASPIRIN 81 MG PO CHEW
324.0000 mg | CHEWABLE_TABLET | Freq: Once | ORAL | Status: AC
  Administered 2021-05-21: 324 mg via ORAL
  Filled 2021-05-21: qty 4

## 2021-05-21 NOTE — ED Notes (Signed)
PA at the bedside.

## 2021-05-21 NOTE — ED Provider Notes (Signed)
Emergency Medicine Provider Triage Evaluation Note  Michael Bernard , a 65 y.o. male  was evaluated in triage.  Pt complains of chest pain.  Patient has been having intermittent chest pain for last 4 to 5 months.  This morning chest pain started while he was walking at 0 900.  Chest pain has been intermittent since then.  Pain is located to the right of the sternum.  Does not radiate anywhere.  Describes it as a pressure.  Patient endorses associated shortness of breath.  Patient states that for last 4 to 5 months he has noticed chest pain and shortness of breath have been exertional.  Patient will occasionally have diaphoresis with these episodes.  Patient denies any nausea, vomiting, or diaphoresis at this time.  Denies any palpitations or leg swelling.  Review of Systems  Positive: Chest pain, shortness of breath Negative: Nausea, vomiting, patient is complex  Physical Exam  BP 133/89 (BP Location: Right Arm)   Pulse 75   Temp 98.2 F (36.8 C) (Oral)   Resp 16   SpO2 96%  Gen:   Awake, no distress   Resp:  Normal effort, speaks in full complete sentences without difficulty, lungs clear to auscultation bilaterally MSK:   Moves extremities without difficulty  Other:  No swelling or edema to bilateral lower extremities.  No tenderness to chest  Medical Decision Making  Medically screening exam initiated at 1:42 PM.  Appropriate orders placed.  Bazil Dhanani was informed that the remainder of the evaluation will be completed by another provider, this initial triage assessment does not replace that evaluation, and the importance of remaining in the ED until their evaluation is complete.  The patient appears stable so that the remainder of the work up may be completed by another provider.      Loni Beckwith, PA-C 05/21/21 1344    Dorie Rank, MD 05/22/21 [phone]

## 2021-05-21 NOTE — H&P (Addendum)
Cardiology Admission History and Physical:   Patient ID: Michael Bernard MRN: [id number]; DOB: November 09, 1956   Admission date: 05/21/2021  PCP:  [name], MD   [facility] Providers Cardiologist:  None      Chief Complaint:  Chest pain  Patient Profile:   Michael Bernard is a 65 y.o. male with GERD, OSA on CPAP, gastric ulcers, benign colon polyps, who is being seen 05/21/2021 for the evaluation of chest pain.  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  Over the last several months, his stamina has decreased and he fatigues more easily. It has progressed to the point that he can only work about 30" before stopping to rest. He gets SOB w/ exertion, that has progressed also.  He can only walk about 100 feet without stopping, but an incline makes it worse.  He started getting chest pain with the SOB. It is a pressure, up to a 6/10. He was only getting it with exertion, but has started getting episodes while sitting up. He tries to relax and take his mind off things to make is go away. He has not tried any medications.   He has GERD meds, but tries to watch what he eats and generally does not have to take anything. He tries to eat several hours before he goes to bed, that helps limit sx as well.   No hx high BP, at home is 135/65 or so, HR 65.  Dr Sarajane Jews checks his cholesterol, it runs ok.  FH of HTN, HLD, CAD, but so far he has done well.    Past Medical History:  Diagnosis Date   Allergy    Cancer ([place]), Melanoma    GERD (gastroesophageal reflux disease)    sees Dr. Lyndel Safe in [place]    Hiatal hernia    Kidney stones    Multiple gastric ulcers 2015   seen on endoscopy per Dr. Lyndel Safe in [place]    Sleep apnea    sees Dr. Keturah Barre, uses CPAP     Past Surgical History:  Procedure Laterality Date   COLONOSCOPY  2015   per Dr. Lyndel Safe in [place], benign polyps, repeat in 7 yrs    HERNIA REPAIR Bilateral    both  inguinal hernias repaired with mesh    MELANOMA EXCISION     per Dr. Jimmye Norman in [place], 3 removed from both shoulders and left temple      Medications Prior to Admission: Prior to Admission medications   Medication Sig Start Date End Date Taking? Authorizing Provider  Azelastine-Fluticasone (DYMISTA) 137-50 MCG/ACT SUSP Place 1-2 puffs into the nose as needed. 08/28/19   [name], MD  benzonatate (TESSALON) 200 MG capsule Take 1 capsule (200 mg total) by mouth every 6 (six) hours as needed for cough. 12/16/20   [name], MD  HYDROcodone-homatropine [facility]) 5-1.5 MG/5ML syrup Take 5 mLs by mouth every 4 (four) hours as needed for cough. 12/03/20   [name], MD  meclizine (ANTIVERT) 25 MG tablet Take 1 tablet (25 mg total) by mouth every 4 (four) hours as needed for dizziness. Patient not taking: Reported on 10/14/2020 04/09/20   [name], MD  omeprazole (PRILOSEC) 40 MG capsule TAKE ONE CAPSULE (40 MG TOTAL) BY MOUTH DAILY 10/20/20   [name], MD  temazepam (RESTORIL) 15 MG capsule Take 1 capsule (15 mg total) by mouth at bedtime as needed for sleep. 10/20/20  [name], MD     Allergies:    Allergies  Allergen Reactions   Penicillins Hives   Trazodone And Nefazodone Other (See Comments)    Feels hung over the next day     Social History:   Social History   Socioeconomic History   Marital status: Married    Spouse name: Not on file   Number of children: Not on file   Years of education: Not on file   Highest education level: Not on file  Occupational History   Occupation: Self-employed, used car lot, Museum/gallery exhibitions officer, Paramedic franchise  Tobacco Use   Smoking status: Former    Years: 3.00    Pack years: 0.00    Types: Cigarettes    Quit date: 06/11/1979    Years since quitting: 41.9   Smokeless tobacco: Never   Tobacco comments:    pt reports smoker 0.5 pack/month as a teenager 06/11/2019  Vaping Use   Vaping Use: Never used  Substance and  Sexual Activity   Alcohol use: Yes    Alcohol/week: 1.0 - 2.0 standard drink    Types: 1 - 2 Cans of beer per week    Comment: occasionally   Drug use: Never   Sexual activity: Yes  Other Topics Concern   Not on file  Social History Narrative   Not on file   Social Determinants of Health   Financial Resource Strain: Not on file  Food Insecurity: Not on file  Transportation Needs: Not on file  Physical Activity: Not on file  Stress: Not on file  Social Connections: Not on file  Intimate Partner Violence: Not on file    Family History:   The patient's family history includes Arthritis in his mother; Atrial fibrillation in his father; Cancer in his mother; Heart attack in his father; Heart disease in his father; Hyperlipidemia in his mother; Hypertension in his father, mother, and sister; Leukemia in his mother; Seizures in his sister.    ROS:  Please see the history of present illness. All other ROS reviewed and negative.     Physical Exam/Data:   Vitals:   05/21/21 1600 05/21/21 1630 05/21/21 1645 05/21/21 1700  BP: (!) 132/96 137/90 128/89 131/82  Pulse: 60 64 (!) 59 (!) 59  Resp: 19 19 17 15   Temp:      TempSrc:      SpO2: 97% 97% 97% 98%  Weight:      Height:       No intake or output data in the 24 hours ending 05/21/21 1756 Last 3 Weights 05/21/2021 10/14/2020 04/09/2020  Weight (lbs) 230 lb 231 lb 6.4 oz 236 lb 9.6 oz  Weight (kg) 104.327 kg 104.962 kg 107.321 kg     Body mass index is 36.02 kg/m.  General:  Well nourished, well developed, male in no acute distress HEENT: normal Lymph: no adenopathy Neck: no JVD Endocrine:  No thryomegaly Vascular: No carotid bruits; 4/4 extremity pulses 2+ bilaterally  Cardiac:  normal S1, S2; RRR; no murmur  Lungs:  clear to auscultation bilaterally, no wheezing, rhonchi or rales  Abd: soft, nontender, no hepatomegaly  Ext: no edema Musculoskeletal:  No deformities, BUE and BLE strength normal and equal Skin: warm and  dry  Neuro:  CNs 2-12 intact, no focal abnormalities noted Psych:  Normal affect    EKG:  The ECG that was done 06/30 was personally reviewed and demonstrates SR, HR 72, no acute ischemic changes  Relevant CV Studies: None  Laboratory Data:  High Sensitivity Troponin:   Recent Labs  Lab 05/21/21 1315 05/21/21 1515  TROPONINIHS 3 4      Chemistry Recent Labs  Lab 05/21/21 1315  NA 138  K 4.0  CL 105  CO2 27  GLUCOSE 91  BUN 10  CREATININE 1.10  CALCIUM 9.2  GFRNONAA >60  ANIONGAP 6    Recent Labs  Lab 05/21/21 1315  PROT 7.3  ALBUMIN 3.7  AST 30  ALT 36  ALKPHOS 68  BILITOT 1.3*   Hematology Recent Labs  Lab 05/21/21 1315  WBC 9.5  RBC 5.32  HGB 16.4  HCT 50.5  MCV 94.9  MCH 30.8  MCHC 32.5  RDW 13.2  PLT 335   BNPNo results for input(s): BNP, PROBNP in the last 168 hours.  DDimer No results for input(s): DDIMER in the last 168 hours.   Radiology/Studies:  DG Chest 2 View  Result Date: 05/21/2021 CLINICAL DATA:  Chest pain, intermittent chest pressure worsen by exertion that started several weeks ago. EXAM: CHEST - 2 VIEW COMPARISON:  March 03, 2012. FINDINGS: Trachea midline. Cardiomediastinal contours and hilar structures are normal. Linear opacities present at the lung bases greatest on the LEFT. No lobar consolidation, pneumothorax or sign of pleural effusion. On limited assessment no acute skeletal process. IMPRESSION: Bilateral linear basilar opacities.  Favor atelectasis or scarring. Electronically Signed   By: Zetta Bills M.D.   On: 05/21/2021 13:55     Assessment and Plan:   Chest pain - multiple episodes, some at rest, but consistent w/ exertion - ez neg MI, ECG not acute - however, w/ family hx, he would prefer definitive eval - consider cath  2. Borderline HTN - DBP is ok, but SBP has been a little high at times - continue to monitor, pt says BP ok at home  3. Elevated bilirubin - hx elevated bilirubin intermittently - no  hx ETOH abuse - recheck in am   Risk Assessment/Risk Scores:    HEAR Score (for undifferentiated chest pain):  HEAR Score: 3{   For questions or updates, please contact [place] Please consult [email or web address] for contact info under     Signed, Rosaria Ferries, PA-C  05/21/2021 5:56 PM

## 2021-05-21 NOTE — ED Notes (Signed)
Pt given sandwich bag and sprite

## 2021-05-21 NOTE — ED Triage Notes (Signed)
Patient complains of intermittent chest pressure that is worsened by exertion that started several weeks ago and is accompanied by dizziness and shortness of breath. Patient states he becomes short of breath walking across parking lots. Patient alert, oriented, and in no apparent distress at this time.

## 2021-05-21 NOTE — ED Provider Notes (Signed)
[facility] EMERGENCY DEPARTMENT Provider Note   CSN: [id number] Arrival date & time: 05/21/21  1248     History Chief Complaint  Patient presents with   Chest Pain    Pauline Trainer is a 65 y.o. male.  65 year old male with past medical history below including melanoma, GERD, kidney stones, OSA on CPAP who presents with chest pain and shortness of breath.  Patient states that for the past few months, he has been having dyspnea on exertion with associated central, nonradiating chest pressure.  The episodes seem to be occurring more frequently lately with less exertion.  Now he will get the symptoms just walking outside to his mailbox.  He reports that symptoms resolve once he rests.  He denies any associated leg swelling, orthopnea, cough/cold symptoms, abdominal pain, or nausea/vomiting.  No recent travel, history of blood clots, or family history of early heart disease.  He does not smoke. Last episode of CP was this morning at 9 am when he was walking, currently resolved.  The history is provided by the patient.  Chest Pain  HPI: A 65 year old patient with a history of obesity presents for evaluation of chest pain. Initial onset of pain was approximately 3-6 hours ago. The patient's chest pain is described as heaviness/pressure/tightness and is not worse with exertion. The patient's chest pain is middle- or left-sided, is not well-localized, is not sharp and does not radiate to the arms/jaw/neck. The patient does not complain of nausea and denies diaphoresis. The patient has a family history of coronary artery disease in a first-degree relative with onset less than age 72. The patient has no history of stroke, has no history of peripheral artery disease, has not smoked in the past 90 days, denies any history of treated diabetes, is not hypertensive and has no history of hypercholesterolemia.   Past Medical History:  Diagnosis Date   Allergy    Cancer ([place]), Melanoma     GERD (gastroesophageal reflux disease)    sees Dr. Lyndel Safe in [place]    Hiatal hernia    Kidney stones    Multiple gastric ulcers 2015   seen on endoscopy per Dr. Lyndel Safe in [facility]    Sleep apnea    sees Dr. Keturah Barre, uses CPAP     Patient Active Problem List   Diagnosis Date Noted   Unstable angina ([place])    Obesity due to excess calories without serious comorbidity    Seasonal and perennial allergic rhinitis 06/10/2018   Obstructive sleep apnea 11/07/2017   Insomnia 11/07/2017   Dyspnea on exertion 11/07/2017   Multiple gastric ulcers 08/24/2017   Hx of malignant melanoma of skin 08/24/2017   GERD 09/18/2007   NEPHROLITHIASIS, HX OF 09/18/2007    Past Surgical History:  Procedure Laterality Date   COLONOSCOPY  2015   per Dr. Lyndel Safe in [place], benign polyps, repeat in 7 yrs    HERNIA REPAIR Bilateral    both inguinal hernias repaired with mesh    MELANOMA EXCISION     per Dr. Jimmye Norman in [place], 3 removed from both shoulders and left temple        Family History  Problem Relation Age of Onset   Arthritis Mother    Cancer Mother    Hyperlipidemia Mother    Hypertension Mother    Leukemia Mother    Atrial fibrillation Father    Heart attack Father    Heart disease Father    Hypertension Father    Hypertension Sister  Seizures Sister     Social History   Tobacco Use   Smoking status: Former    Years: 3.00    Pack years: 0.00    Types: Cigarettes    Quit date: 06/11/1979    Years since quitting: 41.9   Smokeless tobacco: Never   Tobacco comments:    pt reports smoker 0.5 pack/month as a teenager 06/11/2019  Vaping Use   Vaping Use: Never used  Substance Use Topics   Alcohol use: Yes    Alcohol/week: 1.0 - 2.0 standard drink    Types: 1 - 2 Cans of beer per week    Comment: occasionally   Drug use: Never    Home Medications Prior to Admission medications   Medication Sig Start Date End Date Taking? Authorizing Provider   Azelastine-Fluticasone (DYMISTA) 137-50 MCG/ACT SUSP Place 1-2 puffs into the nose as needed. 08/28/19   [name], MD  benzonatate (TESSALON) 200 MG capsule Take 1 capsule (200 mg total) by mouth every 6 (six) hours as needed for cough. 12/16/20   [name], MD  HYDROcodone-homatropine [facility]) 5-1.5 MG/5ML syrup Take 5 mLs by mouth every 4 (four) hours as needed for cough. 12/03/20   [name], MD  meclizine (ANTIVERT) 25 MG tablet Take 1 tablet (25 mg total) by mouth every 4 (four) hours as needed for dizziness. Patient not taking: Reported on 10/14/2020 04/09/20   [name], MD  omeprazole (PRILOSEC) 40 MG capsule TAKE ONE CAPSULE (40 MG TOTAL) BY MOUTH DAILY 10/20/20   [name], MD  temazepam (RESTORIL) 15 MG capsule Take 1 capsule (15 mg total) by mouth at bedtime as needed for sleep. 10/20/20   [name], MD    Allergies    Penicillins and Trazodone and nefazodone  Review of Systems   Review of Systems  Cardiovascular:  Positive for chest pain.  All other systems reviewed and are negative except that which was mentioned in HPI  Physical Exam Updated Vital Signs BP 137/89   Pulse 63   Temp 98.2 F (36.8 C) (Oral)   Resp (!) 22   Ht 5\' 7"  (1.702 m)   Wt 104.3 kg   SpO2 97%   BMI 36.02 kg/m   Physical Exam Constitutional:      General: He is not in acute distress.    Appearance: Normal appearance.  HENT:     Head: Normocephalic and atraumatic.  Eyes:     Conjunctiva/sclera: Conjunctivae normal.  Cardiovascular:     Rate and Rhythm: Normal rate and regular rhythm.     Heart sounds: Normal heart sounds. No murmur heard. Pulmonary:     Effort: Pulmonary effort is normal.     Breath sounds: Normal breath sounds.  Abdominal:     General: Abdomen is flat. Bowel sounds are normal. There is no distension.     Palpations: Abdomen is soft.     Tenderness: There is no abdominal tenderness.  Musculoskeletal:     Right lower leg: No edema.      Left lower leg: No edema.  Skin:    General: Skin is warm and dry.  Neurological:     Mental Status: He is alert and oriented to person, place, and time.     Comments: fluent  Psychiatric:        Mood and Affect: Mood normal.        Behavior: Behavior normal.    ED Results / Procedures / Treatments  Labs (all labs ordered are listed, but only abnormal results are displayed) Labs Reviewed  DIFFERENTIAL - Abnormal; Notable for the following components:      Result Value   Eosinophils Absolute 0.6 (*)    All other components within normal limits  HEPATIC FUNCTION PANEL - Abnormal; Notable for the following components:   Total Bilirubin 1.3 (*)    Indirect Bilirubin 1.1 (*)    All other components within normal limits  BASIC METABOLIC PANEL  CBC  TROPONIN I (HIGH SENSITIVITY)  TROPONIN I (HIGH SENSITIVITY)    EKG EKG Interpretation  Date/Time:  Thursday May 21 2021 13:12:52 EDT Ventricular Rate:  72 PR Interval:  132 QRS Duration: 76 QT Interval:  374 QTC Calculation: 409 R Axis:   92 Text Interpretation: Normal sinus rhythm Rightward axis Borderline ECG No previous ECGs available Confirmed by Theotis Burrow [phone]) on 05/21/2021 5:57:12 PM  Radiology DG Chest 2 View  Result Date: 05/21/2021 CLINICAL DATA:  Chest pain, intermittent chest pressure worsen by exertion that started several weeks ago. EXAM: CHEST - 2 VIEW COMPARISON:  March 03, 2012. FINDINGS: Trachea midline. Cardiomediastinal contours and hilar structures are normal. Linear opacities present at the lung bases greatest on the LEFT. No lobar consolidation, pneumothorax or sign of pleural effusion. On limited assessment no acute skeletal process. IMPRESSION: Bilateral linear basilar opacities.  Favor atelectasis or scarring. Electronically Signed   By: Zetta Bills M.D.   On: 05/21/2021 13:55    Procedures Procedures   Medications Ordered in ED Medications  aspirin chewable tablet 324 mg (324 mg Oral Given  05/21/21 1654)    ED Course  I have reviewed the triage vital signs and the nursing notes.  Pertinent labs & imaging results that were available during my care of the patient were reviewed by me and considered in my medical decision making (see chart for details).    MDM Rules/Calculators/A&P HEAR Score: 3                        Well-appearing on exam, reassuring vital signs, EKG without acute ischemic changes.  No evidence of volume overload.  No risk factors for PE.  Chest x-ray without obvious infiltrate or pulmonary edema.  Screening lab work shows negative troponin.  I am concerned about unstable angina given his description of symptoms and accelerating nature of his symptoms recently.  Contacted cardiology for evaluation.  He is chest pain-free currently but I did give him aspirin while in the ED.  Cardiology evaluated pt and they have decided to admit for heart catherization.  Final Clinical Impression(s) / ED Diagnoses Final diagnoses:  None    Rx / DC Orders ED Discharge Orders     None        Pleas Carneal, Wenda Overland, MD 05/21/21 1827

## 2021-05-22 ENCOUNTER — Observation Stay (HOSPITAL_BASED_OUTPATIENT_CLINIC_OR_DEPARTMENT_OTHER): Payer: BC Managed Care – PPO

## 2021-05-22 ENCOUNTER — Encounter (HOSPITAL_COMMUNITY)
Admission: EM | Disposition: A | Discharge status: Home / Self Care | Payer: Self-pay | Source: Home / Self Care | Attending: Emergency Medicine

## 2021-05-22 DIAGNOSIS — R079 Chest pain, unspecified: Secondary | ICD-10-CM

## 2021-05-22 DIAGNOSIS — I200 Unstable angina: Secondary | ICD-10-CM | POA: Diagnosis not present

## 2021-05-22 DIAGNOSIS — G4733 Obstructive sleep apnea (adult) (pediatric): Secondary | ICD-10-CM | POA: Diagnosis not present

## 2021-05-22 HISTORY — PX: LEFT HEART CATH AND CORONARY ANGIOGRAPHY: CATH118249

## 2021-05-22 LAB — COMPREHENSIVE METABOLIC PANEL
ALT: 37 U/L (ref 0–44)
AST: 29 U/L (ref 15–41)
Albumin: 3.4 g/dL — ABNORMAL LOW (ref 3.5–5.0)
Alkaline Phosphatase: 58 U/L (ref 38–126)
Anion gap: 7 (ref 5–15)
BUN: 12 mg/dL (ref 8–23)
CO2: 26 mmol/L (ref 22–32)
Calcium: 8.7 mg/dL — ABNORMAL LOW (ref 8.9–10.3)
Chloride: 105 mmol/L (ref 98–111)
Creatinine, Ser: 1.03 mg/dL (ref 0.61–1.24)
GFR, Estimated: >60 mL/min (ref >60)
Glucose, Bld: 105 mg/dL — ABNORMAL HIGH (ref 70–99)
Potassium: 3.9 mmol/L (ref 3.5–5.1)
Sodium: 138 mmol/L (ref 135–145)
Total Bilirubin: 1.3 mg/dL — ABNORMAL HIGH (ref 0.3–1.2)
Total Protein: 6.9 g/dL (ref 6.5–8.1)

## 2021-05-22 LAB — CBC
HCT: 48.9 % (ref 39.0–52.0)
Hemoglobin: 16.1 g/dL (ref 13.0–17.0)
MCH: 31.1 pg (ref 26.0–34.0)
MCHC: 32.9 g/dL (ref 30.0–36.0)
MCV: 94.6 fL (ref 80.0–100.0)
Platelets: 283 10*3/uL (ref 150–400)
RBC: 5.17 MIL/uL (ref 4.22–5.81)
RDW: 13 % (ref 11.5–15.5)
WBC: 8.5 10*3/uL (ref 4.0–10.5)
nRBC: 0 % (ref 0.0–0.2)

## 2021-05-22 LAB — CREATININE, SERUM
Creatinine, Ser: 1.09 mg/dL (ref 0.61–1.24)
GFR, Estimated: >60 mL/min (ref >60)

## 2021-05-22 LAB — HIV ANTIBODY (ROUTINE TESTING W REFLEX): HIV Screen 4th Generation wRfx: NONREACTIVE

## 2021-05-22 LAB — SARS CORONAVIRUS 2 BY RT PCR (HOSPITAL ORDER, PERFORMED IN ~~LOC~~ HOSPITAL LAB): SARS Coronavirus 2: NEGATIVE

## 2021-05-22 LAB — ECHOCARDIOGRAM COMPLETE
Area-P 1/2: 3.72 cm2
Calc EF: 69.4 %
Height: 67 in
S' Lateral: 4 cm
Single Plane A2C EF: 73.5 %
Single Plane A4C EF: 62.9 %
Weight: 3680 oz

## 2021-05-22 LAB — LIPID PANEL
Cholesterol: 110 mg/dL (ref 0–200)
HDL: 27 mg/dL — ABNORMAL LOW (ref >40)
LDL Cholesterol: 57 mg/dL (ref 0–99)
Total CHOL/HDL Ratio: 4.1 RATIO
Triglycerides: 128 mg/dL (ref <150)
VLDL: 26 mg/dL (ref 0–40)

## 2021-05-22 LAB — TROPONIN I (HIGH SENSITIVITY)
Troponin I (High Sensitivity): 3 ng/L (ref <18)
Troponin I (High Sensitivity): 4 ng/L (ref <18)

## 2021-05-22 LAB — TSH: TSH: 4.994 u[IU]/mL — ABNORMAL HIGH (ref 0.350–4.500)

## 2021-05-22 SURGERY — LEFT HEART CATH AND CORONARY ANGIOGRAPHY
Anesthesia: LOCAL

## 2021-05-22 MED ORDER — IOHEXOL 350 MG/ML SOLN
INTRAVENOUS | Status: DC | PRN
  Administered 2021-05-22: 55 mL

## 2021-05-22 MED ORDER — SODIUM CHLORIDE 0.9 % IV SOLN: INTRAVENOUS | Status: DC

## 2021-05-22 MED ORDER — SODIUM CHLORIDE 0.9% FLUSH: 3.0000 mL | INTRAVENOUS | Status: DC | PRN

## 2021-05-22 MED ORDER — SODIUM CHLORIDE 0.9% FLUSH: 3.0000 mL | Freq: Two times a day (BID) | INTRAVENOUS | Status: DC

## 2021-05-22 MED ORDER — SODIUM CHLORIDE 0.9 % IV SOLN: 250.0000 mL | INTRAVENOUS | Status: DC | PRN

## 2021-05-22 MED ORDER — PERFLUTREN LIPID MICROSPHERE
1.0000 mL | INTRAVENOUS | Status: AC | PRN
  Administered 2021-05-22: 2 mL via INTRAVENOUS
  Filled 2021-05-22: qty 10

## 2021-05-22 MED ORDER — LABETALOL HCL 5 MG/ML IV SOLN: 10.0000 mg | INTRAVENOUS | Status: DC | PRN

## 2021-05-22 MED ORDER — LIDOCAINE HCL (PF) 1 % IJ SOLN
INTRAMUSCULAR | Status: DC | PRN
  Administered 2021-05-22: 2 mL

## 2021-05-22 MED ORDER — HEPARIN (PORCINE) IN NACL 1000-0.9 UT/500ML-% IV SOLN
INTRAVENOUS | Status: AC
  Filled 2021-05-22: qty 1000

## 2021-05-22 MED ORDER — VERAPAMIL HCL 2.5 MG/ML IV SOLN
INTRAVENOUS | Status: AC
  Filled 2021-05-22: qty 2

## 2021-05-22 MED ORDER — HEPARIN (PORCINE) IN NACL 1000-0.9 UT/500ML-% IV SOLN
INTRAVENOUS | Status: DC | PRN
  Administered 2021-05-22 (×2): 500 mL

## 2021-05-22 MED ORDER — MIDAZOLAM HCL 2 MG/2ML IJ SOLN
INTRAMUSCULAR | Status: AC
  Filled 2021-05-22: qty 2

## 2021-05-22 MED ORDER — FENTANYL CITRATE (PF) 100 MCG/2ML IJ SOLN
INTRAMUSCULAR | Status: DC | PRN
  Administered 2021-05-22: 50 ug via INTRAVENOUS

## 2021-05-22 MED ORDER — HEPARIN SODIUM (PORCINE) 1000 UNIT/ML IJ SOLN
INTRAMUSCULAR | Status: DC | PRN
  Administered 2021-05-22: 5000 [IU] via INTRAVENOUS

## 2021-05-22 MED ORDER — VERAPAMIL HCL 2.5 MG/ML IV SOLN
INTRAVENOUS | Status: DC | PRN
  Administered 2021-05-22: 10 mL via INTRA_ARTERIAL

## 2021-05-22 MED ORDER — HYDRALAZINE HCL 20 MG/ML IJ SOLN: 10.0000 mg | INTRAMUSCULAR | Status: DC | PRN

## 2021-05-22 MED ORDER — HEPARIN SODIUM (PORCINE) 1000 UNIT/ML IJ SOLN
INTRAMUSCULAR | Status: AC
  Filled 2021-05-22: qty 1

## 2021-05-22 MED ORDER — FENTANYL CITRATE (PF) 100 MCG/2ML IJ SOLN
INTRAMUSCULAR | Status: AC
  Filled 2021-05-22: qty 2

## 2021-05-22 MED ORDER — MIDAZOLAM HCL 2 MG/2ML IJ SOLN
INTRAMUSCULAR | Status: DC | PRN
  Administered 2021-05-22: 2 mg via INTRAVENOUS

## 2021-05-22 MED ORDER — LIDOCAINE HCL (PF) 1 % IJ SOLN
INTRAMUSCULAR | Status: AC
  Filled 2021-05-22: qty 30

## 2021-05-22 SURGICAL SUPPLY — 10 items
CATH 5FR JL3.5 JR4 ANG PIG MP (CATHETERS) ×1 IMPLANT
CATH INFINITI 5 FR JL3.5 (CATHETERS) ×1 IMPLANT
DEVICE RAD COMP TR BAND LRG (VASCULAR PRODUCTS) ×1 IMPLANT
GLIDESHEATH SLEND SS 6F .021 (SHEATH) ×1 IMPLANT
KIT HEART LEFT (KITS) ×2 IMPLANT
PACK CARDIAC CATHETERIZATION (CUSTOM PROCEDURE TRAY) ×2 IMPLANT
TRANSDUCER W/STOPCOCK (MISCELLANEOUS) ×2 IMPLANT
TUBING CIL FLEX 10 FLL-RA (TUBING) ×2 IMPLANT
WIRE EMERALD 3MM-J .035X150CM (WIRE) ×1 IMPLANT
WIRE HI TORQ VERSACORE J 260CM (WIRE) ×1 IMPLANT

## 2021-05-22 NOTE — Progress Notes (Signed)
Pt leaves cathn lab holding area in stable condition. Rt radial is CDI. 2+ rt radial.

## 2021-05-22 NOTE — Progress Notes (Signed)
Progress Note  Patient Name: Michael Bernard Date of Encounter: 05/22/2021  Primary Cardiologist: New   Subjective   Mild recurrent chest pain this morning, otherwise doing well. Echocardiogram performed with pending results   Inpatient Medications    Scheduled Meds:  enoxaparin (LOVENOX) injection  40 mg Subcutaneous Daily   sodium chloride flush  3 mL Intravenous Q12H   sodium chloride flush  3 mL Intravenous Q12H   Continuous Infusions:  sodium chloride     sodium chloride     sodium chloride 1 mL/kg/hr (05/22/21 0513)   PRN Meds: sodium chloride, sodium chloride, acetaminophen, ALPRAZolam, nitroGLYCERIN, ondansetron (ZOFRAN) IV, perflutren lipid microspheres (DEFINITY) IV suspension, sodium chloride flush, sodium chloride flush, zolpidem   Vital Signs    Vitals:   05/22/21 0915 05/22/21 0930 05/22/21 0945 05/22/21 1000  BP: 124/87 124/76 133/81 130/79  Pulse: (!) 59 64 67 67  Resp: 18 18 18 19   Temp:      TempSrc:      SpO2: 96% 96% 97% 96%  Weight:      Height:       No intake or output data in the 24 hours ending 05/22/21 1010 Filed Weights   05/21/21 1512  Weight: 104.3 kg    Physical Exam   General: Well developed, well nourished, NAD Neck: Negative for carotid bruits. No JVD Lungs:Clear to ausculation bilaterally. No wheezes, rales, or rhonchi. Breathing is unlabored. Cardiovascular: RRR with S1 S2. No murmurs Extremities: No edema.  Neuro: Alert and oriented. No focal deficits. No facial asymmetry. MAE spontaneously. Psych: Responds to questions appropriately with normal affect.    Labs    Chemistry Recent Labs  Lab 05/21/21 1315 05/21/21 2337 05/22/21 0233  NA 138  --  138  K 4.0  --  3.9  CL 105  --  105  CO2 27  --  26  GLUCOSE 91  --  105*  BUN 10  --  12  CREATININE 1.10 1.09 1.03  CALCIUM 9.2  --  8.7*  PROT 7.3  --  6.9  ALBUMIN 3.7  --  3.4*  AST 30  --  29  ALT 36  --  37  ALKPHOS 68  --  58  BILITOT 1.3*  --  1.3*   GFRNONAA >60 >60 >60  ANIONGAP 6  --  7     Hematology Recent Labs  Lab 05/21/21 1315 05/21/21 2337  WBC 9.5 8.5  RBC 5.32 5.17  HGB 16.4 16.1  HCT 50.5 48.9  MCV 94.9 94.6  MCH 30.8 31.1  MCHC 32.5 32.9  RDW 13.2 13.0  PLT 335 283    Cardiac EnzymesNo results for input(s): TROPONINI in the last 168 hours. No results for input(s): TROPIPOC in the last 168 hours.   BNPNo results for input(s): BNP, PROBNP in the last 168 hours.   DDimer No results for input(s): DDIMER in the last 168 hours.   Radiology    DG Chest 2 View  Result Date: 05/21/2021 CLINICAL DATA:  Chest pain, intermittent chest pressure worsen by exertion that started several weeks ago. EXAM: CHEST - 2 VIEW COMPARISON:  March 03, 2012. FINDINGS: Trachea midline. Cardiomediastinal contours and hilar structures are normal. Linear opacities present at the lung bases greatest on the LEFT. No lobar consolidation, pneumothorax or sign of pleural effusion. On limited assessment no acute skeletal process. IMPRESSION: Bilateral linear basilar opacities.  Favor atelectasis or scarring. Electronically Signed   By: Zetta Bills  M.D.   On: 05/21/2021 13:55    Telemetry    05/22/21 NSR - Personally Reviewed  ECG    No new tracing as of 05/22/21 - Personally Reviewed  Cardiac Studies   None   Patient Profile     65 y.o. male with GERD, OSA on CPAP, gastric ulcers, benign colon polyps, who is being seen 05/21/2021 for the evaluation of chest pain.   Assessment & Plan    1. Chest pain: -Presented with symptoms consistent with classic angina>>>months of progressive exertional dyspnea, chest pressure and diaphoresis  -HsT negative with no EKG changes on presentation  -No prior hx of CAD however plan to proceed with LHC for further ischemic evaluation  -Risk stratification with LDL which was well controlled at 57  2. HTN: -Stable, 130/79>>133/81>>124/76 -Not currently being treated with antihypertensives   3. Remote  hx of bleeding ulcer: -Managed by stress reduction and dietary changes with no recurrence   Signed, Kathyrn Drown NP-C [facility] Pager: [phone] 05/22/2021, 10:10 AM     For questions or updates, please contact   Please consult [email or web address] for contact info under Cardiology/STEMI.

## 2021-05-22 NOTE — Discharge Summary (Signed)
Discharge Summary    Patient ID: Michael Bernard MRN: [id number]; DOB: Sep 30, 1956  Admit date: 05/21/2021 Discharge date: 05/22/2021  PCP:  [name], MD   [facility] Providers }   Discharge Diagnoses    Principal Problem:   Progressive angina [facility]) Active Problems:   GERD   Obstructive sleep apnea   Chest pain of uncertain etiology  Diagnostic Studies/Procedures    Echocardiogram 05/22/21:   1. Left ventricular ejection fraction, by estimation, is 60 to 65%. The  left ventricle has normal function. The left ventricle has no regional  wall motion abnormalities. Left ventricular diastolic parameters were  normal.   2. Right ventricular systolic function is normal. The right ventricular  size is normal. There is normal pulmonary artery systolic pressure.   3. The mitral valve is normal in structure. No evidence of mitral valve  regurgitation.   4. The aortic valve is normal in structure. Aortic valve regurgitation is  not visualized.    LHC 05/22/21:  Prox RCA to Mid RCA lesion is 20% stenosed.   Mild non-obstructive plaque in the mid RCA No angiographic evidence of disease in the LAD or Circumflex   Recommendations: Medical management of very mild CAD  Diagnostic Dominance: Right      History of Present Illness     Michael Bernard is a 65 y.o. male with GERD, OSA on CPAP, gastric ulcers, benign colon polyps, who is being seen 05/21/2021 for the evaluation of chest pain.  Hospital Course     [name] presented to [facility] 05/21/21 with reports of months long, progressive exertional dyspnea, chest pressure and diaphoresis. He reported that he no longer was able to walk up his driveway without getting these symptoms.  On presentation, HsT remained negative and EKG/tele has been unremarkable. Symptoms felt to be classic for angina. He does have a remote history of nonbleeding ulcers over 7 years ago which has been managed by stress reduction and dietary changes. Overall,  his symptoms are concerning enough with plans to proceed with cardiac catheterization due to concern for high pretest probability.   Cath fortunately showed minimal CAD with pRCA and mRCA 20% stenosis. Plan for risk reduction strategies. Should evaluate for other etiologies of chest pain.   Chest pain: -Presented with symptoms consistent with classic angina>>>months of progressive exertional dyspnea, chest pressure and diaphoresis  -HsT negative with no EKG changes on presentation -No prior hx of CAD however plan to proceed with LHC for further ischemic evaluation -Risk stratification with LDL which was well controlled at 66   HTN: -Stable, 130/79>>133/81>>124/76 -Not currently being treated with antihypertensives   Remote hx of bleeding ulcer: -Managed by stress reduction and dietary changes with no recurrence    Consultants: None    The patient was seen and examined by Dr. Oval Linsey and Dr. Angelena Form who feel that he is stable and ready for discharge today. He will not need to follow up with cardiology. He can follow with his PCP.   Did the patient have an acute coronary syndrome (MI, NSTEMI, STEMI, etc) this admission?:  No                               Did the patient have a percutaneous coronary intervention (stent / angioplasty)?:  No.    _____________  Discharge Vitals Blood pressure 122/63, pulse 64, temperature 98.2 F (36.8 C), temperature source Oral, resp. rate 13, height 5\' 7"  (1.702 m),  weight 104.3 kg, SpO2 97 %.  Filed Weights   05/21/21 1512  Weight: 104.3 kg   Labs & Radiologic Studies    CBC Recent Labs    05/21/21 1315 05/21/21 2337  WBC 9.5 8.5  NEUTROABS 5.9  --   HGB 16.4 16.1  HCT 50.5 48.9  MCV 94.9 94.6  PLT 335 759   Basic Metabolic Panel Recent Labs    05/21/21 1315 05/21/21 2337 05/22/21 0233  NA 138  --  138  K 4.0  --  3.9  CL 105  --  105  CO2 27  --  26  GLUCOSE 91  --  105*  BUN 10  --  12  CREATININE 1.10 1.09 1.03  CALCIUM  9.2  --  8.7*   Liver Function Tests Recent Labs    05/21/21 1315 05/22/21 0233  AST 30 29  ALT 36 37  ALKPHOS 68 58  BILITOT 1.3* 1.3*  PROT 7.3 6.9  ALBUMIN 3.7 3.4*   No results for input(s): LIPASE, AMYLASE in the last 72 hours. High Sensitivity Troponin:   Recent Labs  Lab 05/21/21 1315 05/21/21 1515 05/21/21 2337 05/22/21 0233  TROPONINIHS 3 4 3 4     BNP Invalid input(s): POCBNP D-Dimer No results for input(s): DDIMER in the last 72 hours. Hemoglobin A1C No results for input(s): HGBA1C in the last 72 hours. Fasting Lipid Panel Recent Labs    05/22/21 0233  CHOL 110  HDL 27*  LDLCALC 57  TRIG 128  CHOLHDL 4.1   Thyroid Function Tests Recent Labs    05/21/21 2337  TSH 4.994*   _____________  DG Chest 2 View  Result Date: 05/21/2021 CLINICAL DATA:  Chest pain, intermittent chest pressure worsen by exertion that started several weeks ago. EXAM: CHEST - 2 VIEW COMPARISON:  March 03, 2012. FINDINGS: Trachea midline. Cardiomediastinal contours and hilar structures are normal. Linear opacities present at the lung bases greatest on the LEFT. No lobar consolidation, pneumothorax or sign of pleural effusion. On limited assessment no acute skeletal process. IMPRESSION: Bilateral linear basilar opacities.  Favor atelectasis or scarring. Electronically Signed   By: Zetta Bills M.D.   On: 05/21/2021 13:55   CARDIAC CATHETERIZATION  Result Date: 05/22/2021  Prox RCA to Mid RCA lesion is 20% stenosed.  Mild non-obstructive plaque in the mid RCA No angiographic evidence of disease in the LAD or Circumflex Recommendations: Medical management of very mild CAD   ECHOCARDIOGRAM COMPLETE  Result Date: 05/22/2021    ECHOCARDIOGRAM REPORT   Patient Name:   Michael Bernard Date of Exam: 05/22/2021 Medical Rec #:  [id number]   Height:       67.0 in Accession #:    [id number]  Weight:       230.0 lb Date of Birth:  February 09, 1956   BSA:          2.146 m Patient Age:    37 years    BP:            136/82 mmHg Patient Gender: M           HR:           62 bpm. Exam Location:  Inpatient Procedure: 2D Echo, Cardiac Doppler, Color Doppler and Intracardiac            Opacification Agent Indications:    Chest Pain R07.9  History:        Patient has no prior history of Echocardiogram examinations.  Signs/Symptoms:DOE and Chest Pain; Risk Factors:Former Smoker                 and Sleep Apnea. GERD.  Sonographer:    Vickie Epley RDCS Referring Phys: 7 [place]  1. Left ventricular ejection fraction, by estimation, is 60 to 65%. The left ventricle has normal function. The left ventricle has no regional wall motion abnormalities. Left ventricular diastolic parameters were normal.  2. Right ventricular systolic function is normal. The right ventricular size is normal. There is normal pulmonary artery systolic pressure.  3. The mitral valve is normal in structure. No evidence of mitral valve regurgitation.  4. The aortic valve is normal in structure. Aortic valve regurgitation is not visualized. FINDINGS  Left Ventricle: Left ventricular ejection fraction, by estimation, is 60 to 65%. The left ventricle has normal function. The left ventricle has no regional wall motion abnormalities. Definity contrast agent was given IV to delineate the left ventricular  endocardial borders. The left ventricular internal cavity size was normal in size. There is no left ventricular hypertrophy. Left ventricular diastolic parameters were normal. Right Ventricle: The right ventricular size is normal. Right vetricular wall thickness was not well visualized. Right ventricular systolic function is normal. There is normal pulmonary artery systolic pressure. The tricuspid regurgitant velocity is 1.89 m/s, and with an assumed right atrial pressure of 3 mmHg, the estimated right ventricular systolic pressure is 70.6 mmHg. Left Atrium: Left atrial size was normal in size. Right Atrium: Right atrial size was  normal in size. Pericardium: There is no evidence of pericardial effusion. Mitral Valve: The mitral valve is normal in structure. No evidence of mitral valve regurgitation. Tricuspid Valve: The tricuspid valve is normal in structure. Tricuspid valve regurgitation is trivial. Aortic Valve: The aortic valve is normal in structure. Aortic valve regurgitation is not visualized. Pulmonic Valve: The pulmonic valve was grossly normal. Pulmonic valve regurgitation is not visualized. Aorta: The aortic root and ascending aorta are structurally normal, with no evidence of dilitation. IAS/Shunts: The atrial septum is grossly normal.  LEFT VENTRICLE PLAX 2D LVIDd:         5.10 cm      Diastology LVIDs:         4.00 cm      LV e' medial:    5.03 cm/s LV PW:         0.70 cm      LV E/e' medial:  12.0 LV IVS:        0.70 cm      LV e' lateral:   6.57 cm/s LVOT diam:     2.10 cm      LV E/e' lateral: 9.2 LV SV:         60 LV SV Index:   28 LVOT Area:     3.46 cm  LV Volumes (MOD) LV vol d, MOD A2C: 108.0 ml LV vol d, MOD A4C: 123.0 ml LV vol s, MOD A2C: 28.6 ml LV vol s, MOD A4C: 45.6 ml LV SV MOD A2C:     79.4 ml LV SV MOD A4C:     123.0 ml LV SV MOD BP:      83.2 ml RIGHT VENTRICLE RV S prime:     11.50 cm/s TAPSE (M-mode): 2.0 cm LEFT ATRIUM             Index       RIGHT ATRIUM           Index LA diam:  4.00 cm 1.86 cm/m  RA Area:     15.40 cm LA Vol (A2C):   29.0 ml 13.51 ml/m RA Volume:   37.80 ml  17.61 ml/m LA Vol (A4C):   48.1 ml 22.41 ml/m LA Biplane Vol: 39.6 ml 18.45 ml/m  AORTIC VALVE LVOT Vmax:   65.50 cm/s LVOT Vmean:  44.400 cm/s LVOT VTI:    0.173 m  AORTA Ao Root diam: 3.00 cm MITRAL VALVE               TRICUSPID VALVE MV Area (PHT): 3.72 cm    TV Peak grad:   17.6 mmHg MV Decel Time: 204 msec    TV Vmax:        2.10 m/s MV E velocity: 60.60 cm/s  TR Peak grad:   14.3 mmHg MV A velocity: 66.10 cm/s  TR Vmax:        189.00 cm/s MV E/A ratio:  0.92                            SHUNTS                             Systemic VTI:  0.17 m                            Systemic Diam: 2.10 cm Mertie Moores MD Electronically signed by Mertie Moores MD Signature Date/Time: 05/22/2021/12:39:00 PM    Final    Disposition   Pt is being discharged home today in good condition.  Follow-up Plans & Appointments   None   Discharge Medications   Allergies as of 05/22/2021       Reactions   Penicillins Hives   Trazodone And Nefazodone Other (See Comments)   Feels hung over the next day         Medication List     ASK your doctor about these medications    Azelastine-Fluticasone 137-50 MCG/ACT Susp Commonly known as: Dymista Place 1-2 puffs into the nose as needed.   benzonatate 200 MG capsule Commonly known as: TESSALON Take 1 capsule (200 mg total) by mouth every 6 (six) hours as needed for cough.   HYDROcodone-homatropine 5-1.5 MG/5ML syrup Commonly known as: HYCODAN Take 5 mLs by mouth every 4 (four) hours as needed for cough.   meclizine 25 MG tablet Commonly known as: ANTIVERT Take 1 tablet (25 mg total) by mouth every 4 (four) hours as needed for dizziness.   omeprazole 40 MG capsule Commonly known as: PRILOSEC TAKE ONE CAPSULE (40 MG TOTAL) BY MOUTH DAILY   temazepam 15 MG capsule Commonly known as: RESTORIL Take 1 capsule (15 mg total) by mouth at bedtime as needed for sleep.        Outstanding Labs/Studies   None   Duration of Discharge Encounter   Greater than 30 minutes including physician time.  Signed, Kathyrn Drown, NP 05/22/2021, 1:55 PM

## 2021-05-22 NOTE — Discharge Instructions (Signed)

## 2021-05-22 NOTE — Interval H&P Note (Signed)
History and Physical Interval Note:  05/22/2021 10:53 AM  Michael Bernard  has presented today for surgery, with the diagnosis of Chest Pain.  The various methods of treatment have been discussed with the patient and family. After consideration of risks, benefits and other options for treatment, the patient has consented to  Procedure(s): LEFT HEART CATH AND CORONARY ANGIOGRAPHY (N/A) as a surgical intervention.  The patient's history has been reviewed, patient examined, no change in status, stable for surgery.  I have reviewed the patient's chart and labs.  Questions were answered to the patient's satisfaction.    Cath Lab Visit (complete for each Cath Lab visit)  Clinical Evaluation Leading to the Procedure:   ACS: No.  Non-ACS:    Anginal Classification: CCS III  Anti-ischemic medical therapy: No Therapy  Non-Invasive Test Results: No non-invasive testing performed  Prior CABG: No previous CABG        Lauree Chandler

## 2021-05-22 NOTE — Progress Notes (Signed)
Upon arrival noted r hand cool, slight discoloration, pt. C/o r hand numbness. Reverse barbeu noted wave form slightly dampened at unlar occulsion. 3 cc of air removed from TR band with resolution of numbness, cap refill less than 3, reverse barbeu waveforms intact and brisk. Pt. Offers no /co; awaiting bed assignment with call bell placed at left hand.

## 2021-05-22 NOTE — Progress Notes (Signed)
Report to S.Twine- pt resting without complaints, call bell at left hand.

## 2021-05-23 LAB — HEMOGLOBIN A1C
Hgb A1c MFr Bld: 5.4 % (ref 4.8–5.6)
Mean Plasma Glucose: 108 mg/dL

## 2021-05-26 ENCOUNTER — Encounter (HOSPITAL_COMMUNITY): Payer: Self-pay | Admitting: Cardiovascular Disease

## 2021-06-20 ENCOUNTER — Other Ambulatory Visit: Payer: Self-pay | Admitting: Family Medicine

## 2021-07-22 ENCOUNTER — Encounter: Payer: Self-pay | Admitting: Family Medicine

## 2021-07-22 ENCOUNTER — Telehealth (INDEPENDENT_AMBULATORY_CARE_PROVIDER_SITE_OTHER): Payer: BC Managed Care – PPO | Admitting: Family Medicine

## 2021-07-22 DIAGNOSIS — R5383 Other fatigue: Secondary | ICD-10-CM

## 2021-07-22 DIAGNOSIS — E039 Hypothyroidism, unspecified: Secondary | ICD-10-CM | POA: Diagnosis not present

## 2021-07-22 DIAGNOSIS — K2100 Gastro-esophageal reflux disease with esophagitis, without bleeding: Secondary | ICD-10-CM

## 2021-07-22 DIAGNOSIS — R079 Chest pain, unspecified: Secondary | ICD-10-CM | POA: Diagnosis not present

## 2021-07-22 MED ORDER — FAMOTIDINE 40 MG PO TABS: 40.0000 mg | ORAL_TABLET | Freq: Every day | ORAL | 3 refills | Status: DC

## 2021-07-22 NOTE — Progress Notes (Signed)
Subjective:    Patient ID: Michael Bernard, male    DOB: 1956-10-15, 65 y.o.   MRN: [id number]  HPI Here to follow up a hospital stay from 05-21-21 to 05-22-21 for chest pains and fatigue. This started about 2 months ago. He describes feeling tired all the time and he has decreased exercise tolerance. No SOB as such. He has also had intermittent squeezing type chest pains I the center of his chest. These are not related to exertion and can happen at any time. They are not severe but are uncomfortable. They last about 3-4 minutes and then go away. He has a hx of GERD and a hiatal hernia he has ben prescribed Omeprazole 40 mg but he only takes this sporadically. At the hospital his EKG's remained normal and a CXR was normal. He had a cardiac catheterization that revealed only a 20% stenosis at one area of the RCA. His labs were normal except for an elevated TSH at 4.994. This was not talked about. He was sent home with no medication changes and told to follow up with [place]. He has continued to have these episodes.  Virtual Visit via Video Note  I connected with the patient on 07/22/21 at  3:45 PM EDT by a video enabled telemedicine application and verified that I am speaking with the correct person using two identifiers.  Location patient: home Location provider:work or home office Persons participating in the virtual visit: patient, provider  I discussed the limitations of evaluation and management by telemedicine and the availability of in person appointments. The patient expressed understanding and agreed to proceed.   HPI:    ROS: See pertinent positives and negatives per HPI.  Past Medical History:  Diagnosis Date   Allergy    Cancer ([place]), Melanoma    GERD (gastroesophageal reflux disease)    sees Dr. Lyndel Safe in [place]    Hiatal hernia    Kidney stones    Multiple gastric ulcers 2015   seen on endoscopy per Dr. Lyndel Safe in [place]    Sleep apnea    sees Dr. Keturah Barre, uses CPAP      Past Surgical History:  Procedure Laterality Date   COLONOSCOPY  2015   per Dr. Lyndel Safe in [place], benign polyps, repeat in 7 yrs    HERNIA REPAIR Bilateral    both inguinal hernias repaired with mesh    LEFT HEART CATH AND CORONARY ANGIOGRAPHY N/A 05/22/2021   Procedure: LEFT HEART CATH AND CORONARY ANGIOGRAPHY;  Surgeon: Burnell Blanks, MD;  Location: [place] [facility];  Service: Cardiovascular;  Laterality: N/A;   MELANOMA EXCISION     per Dr. Jimmye Norman in [place], 3 removed from both shoulders and left temple     Family History  Problem Relation Age of Onset   Arthritis Mother    Cancer Mother    Hyperlipidemia Mother    Hypertension Mother    Leukemia Mother    Atrial fibrillation Father    Heart attack Father    Heart disease Father    Hypertension Father    Hypertension Sister    Seizures Sister      Current Outpatient Medications:    famotidine (PEPCID) 40 MG tablet, Take 1 tablet (40 mg total) by mouth at bedtime., Disp: 90 tablet, Rfl: 3   meclizine (ANTIVERT) 25 MG tablet, Take 1 tablet (25 mg total) by mouth every 4 (four) hours as needed for dizziness. (Patient not taking: No sig reported), Disp: 60 tablet, Rfl:  2   omeprazole (PRILOSEC) 40 MG capsule, TAKE ONE CAPSULE (40 MG TOTAL) BY MOUTH DAILY, Disp: 90 capsule, Rfl: 2   temazepam (RESTORIL) 15 MG capsule, Take 1 capsule (15 mg total) by mouth at bedtime as needed for sleep. (Patient not taking: Reported on 05/21/2021), Disp: 30 capsule, Rfl: 2  EXAM:  VITALS per patient if applicable:  GENERAL: alert, oriented, appears well and in no acute distress  HEENT: atraumatic, conjunttiva clear, no obvious abnormalities on inspection of external nose and ears  NECK: normal movements of the head and neck  LUNGS: on inspection no signs of respiratory distress, breathing rate appears normal, no obvious gross SOB, gasping or wheezing  CV: no obvious cyanosis  MS: moves all visible extremities without  noticeable abnormality  PSYCH/NEURO: pleasant and cooperative, no obvious depression or anxiety, speech and thought processing grossly intact  ASSESSMENT AND PLAN: His chest pains are likely caused by esophageal spasms, and these result from poorly controlled GERD. He will now take the Omeprazole every morning and we will add Pepcid 40 mg every evening. I think his fatigue may result from hypothyroidism. He will come by our lab tomorrow to check a TSH, free T3, and free T4. We will then treat this accordingly. We spent 35 minutes reviewing records and discussing these issues.  Alysia Penna, MD  Discussed the following assessment and plan:  Hypothyroidism, unspecified type - Plan: TSH, T3, free, T4, free     I discussed the assessment and treatment plan with the patient. The patient was provided an opportunity to ask questions and all were answered. The patient agreed with the plan and demonstrated an understanding of the instructions.   The patient was advised to call back or seek an in-person evaluation if the symptoms worsen or if the condition fails to improve as anticipated.     Review of Systems  Constitutional:  Positive for fatigue.  Respiratory: Negative.    Cardiovascular:  Positive for chest pain. Negative for palpitations and leg swelling.  Gastrointestinal: Negative.       Objective:   Physical Exam        Assessment & Plan:

## 2021-07-23 ENCOUNTER — Other Ambulatory Visit: Payer: Self-pay

## 2021-07-23 ENCOUNTER — Other Ambulatory Visit (INDEPENDENT_AMBULATORY_CARE_PROVIDER_SITE_OTHER): Payer: BC Managed Care – PPO

## 2021-07-23 DIAGNOSIS — E039 Hypothyroidism, unspecified: Secondary | ICD-10-CM | POA: Diagnosis not present

## 2021-07-24 LAB — T4, FREE: Free T4: 0.64 ng/dL (ref 0.60–1.60)

## 2021-07-24 LAB — TSH: TSH: 4.06 u[IU]/mL (ref 0.35–5.50)

## 2021-07-24 LAB — T3, FREE: T3, Free: 3.2 pg/mL (ref 2.3–4.2)

## 2021-08-02 DIAGNOSIS — R0981 Nasal congestion: Secondary | ICD-10-CM | POA: Diagnosis not present

## 2021-08-02 DIAGNOSIS — J0100 Acute maxillary sinusitis, unspecified: Secondary | ICD-10-CM | POA: Diagnosis not present

## 2021-08-24 ENCOUNTER — Telehealth: Payer: BC Managed Care – PPO | Admitting: Family Medicine

## 2021-08-24 ENCOUNTER — Encounter: Payer: Self-pay | Admitting: Family Medicine

## 2021-08-24 DIAGNOSIS — U071 COVID-19: Secondary | ICD-10-CM | POA: Insufficient documentation

## 2021-08-24 MED ORDER — HYDROCODONE BIT-HOMATROP MBR 5-1.5 MG/5ML PO SOLN: 5.0000 mL | ORAL | 0 refills | Status: DC | PRN

## 2021-08-24 MED ORDER — METHYLPREDNISOLONE 4 MG PO TBPK: ORAL_TABLET | ORAL | 0 refills | Status: DC

## 2021-08-24 NOTE — Progress Notes (Signed)
Subjective:    Patient ID: Michael Bernard, male    DOB: 03-02-1956, 65 y.o.   MRN: [id number]  HPI Virtual Visit via Video Note  I connected with the patient on 08/24/21 at  2:00 PM EDT by a video enabled telemedicine application and verified that I am speaking with the correct person using two identifiers.  Location patient: home Location provider:work or home office Persons participating in the virtual visit: patient, provider  I discussed the limitations of evaluation and management by telemedicine and the availability of in person appointments. The patient expressed understanding and agreed to proceed.   HPI: Here for a Covid-19 infection. About 6 days ago he developed body aches, chest congestion, a dry cough, and some nausea. No fever. No SOB or chest pain. Using Mucinex. He and his wife both tested positive for the Covid virus on 08-18-21.    ROS: See pertinent positives and negatives per HPI.  Past Medical History:  Diagnosis Date   Allergy    Cancer ([place]), Melanoma    GERD (gastroesophageal reflux disease)    sees Dr. Lyndel Safe in [place]    Hiatal hernia    Kidney stones    Multiple gastric ulcers 2015   seen on endoscopy per Dr. Lyndel Safe in [place]    Sleep apnea    sees Dr. Keturah Barre, uses CPAP     Past Surgical History:  Procedure Laterality Date   COLONOSCOPY  2015   per Dr. Lyndel Safe in [place], benign polyps, repeat in 7 yrs    HERNIA REPAIR Bilateral    both inguinal hernias repaired with mesh    LEFT HEART CATH AND CORONARY ANGIOGRAPHY N/A 05/22/2021   Procedure: LEFT HEART CATH AND CORONARY ANGIOGRAPHY;  Surgeon: Burnell Blanks, MD;  Location: [place] [facility];  Service: Cardiovascular;  Laterality: N/A;   MELANOMA EXCISION     [place] Jimmye [place], 3 removed from both shoulders and left temple     Family History  Problem Relation Age of Onset   Arthritis Mother    Cancer Mother    Hyperlipidemia Mother    Hypertension Mother     Leukemia Mother    Atrial fibrillation Father    Heart attack Father    Heart disease Father    Hypertension Father    Hypertension Sister    Seizures Sister      Current Outpatient Medications:    famotidine (PEPCID) 40 MG tablet, Take 1 tablet (40 mg total) by mouth at bedtime., Disp: 90 tablet, Rfl: 3   HYDROcodone bit-homatropine (HYCODAN) 5-1.5 MG/5ML syrup, Take 5 mLs by mouth every 4 (four) hours as needed., Disp: 240 mL, Rfl: 0   methylPREDNISolone (MEDROL DOSEPAK) 4 MG TBPK tablet, As directed, Disp: 21 tablet, Rfl: 0   omeprazole (PRILOSEC) 40 MG capsule, TAKE ONE CAPSULE (40 MG TOTAL) BY MOUTH DAILY, Disp: 90 capsule, Rfl: 2  EXAM:  VITALS per patient if applicable:  GENERAL: alert, oriented, appears well and in no acute distress  HEENT: atraumatic, conjunttiva clear, no obvious abnormalities on inspection of external nose and ears  NECK: normal movements of the head and neck  LUNGS: on inspection no signs of respiratory distress, breathing rate appears normal, no obvious gross SOB, gasping or wheezing  CV: no obvious cyanosis  MS: moves all visible extremities without noticeable abnormality  PSYCH/NEURO: pleasant and cooperative, no obvious depression or anxiety, speech and thought processing grossly intact  ASSESSMENT AND PLAN: Covid infection. It has been  too long to use an antiviral. He will try a Medrol dose pack and cough syrup. Quarantine fro 5 days. Alysia Penna, MD  Discussed the following assessment and plan:  No diagnosis found.     I discussed the assessment and treatment plan with the patient. The patient was provided an opportunity to ask questions and all were answered. The patient agreed with the plan and demonstrated an understanding of the instructions.   The patient was advised to call back or seek an in-person evaluation if the symptoms worsen or if the condition fails to improve as anticipated.      Review of Systems     Objective:    Physical Exam        Assessment & Plan:

## 2021-08-28 ENCOUNTER — Encounter: Payer: Self-pay | Admitting: Gastroenterology

## 2021-09-30 ENCOUNTER — Other Ambulatory Visit: Payer: Self-pay | Admitting: Family Medicine

## 2021-10-05 NOTE — Telephone Encounter (Signed)
Last refill-08/24/21 Last video visit- 08/24/21  Next appointment physical-10/20/21

## 2021-10-20 ENCOUNTER — Ambulatory Visit (INDEPENDENT_AMBULATORY_CARE_PROVIDER_SITE_OTHER): Payer: BC Managed Care – PPO | Admitting: Family Medicine

## 2021-10-20 ENCOUNTER — Encounter: Payer: Self-pay | Admitting: Family Medicine

## 2021-10-20 VITALS — BP 120/78 | HR 71 | Temp 98.4°F | Ht 67.25 in | Wt 234.0 lb

## 2021-10-20 DIAGNOSIS — Z125 Encounter for screening for malignant neoplasm of prostate: Secondary | ICD-10-CM | POA: Diagnosis not present

## 2021-10-20 DIAGNOSIS — Z0000 Encounter for general adult medical examination without abnormal findings: Secondary | ICD-10-CM

## 2021-10-20 LAB — BASIC METABOLIC PANEL
BUN: 13 mg/dL (ref 6–23)
CO2: 28 mEq/L (ref 19–32)
Calcium: 9.5 mg/dL (ref 8.4–10.5)
Chloride: 101 mEq/L (ref 96–112)
Creatinine, Ser: 1.01 mg/dL (ref 0.40–1.50)
GFR: 78.8 mL/min (ref >60.00)
Glucose, Bld: 85 mg/dL (ref 70–99)
Potassium: 4.1 mEq/L (ref 3.5–5.1)
Sodium: 136 mEq/L (ref 135–145)

## 2021-10-20 LAB — LIPID PANEL
Cholesterol: 143 mg/dL (ref 0–200)
HDL: 36.7 mg/dL — ABNORMAL LOW (ref >39.00)
LDL Cholesterol: 76 mg/dL (ref 0–99)
NonHDL: 106.72
Total CHOL/HDL Ratio: 4
Triglycerides: 154 mg/dL — ABNORMAL HIGH (ref 0.0–149.0)
VLDL: 30.8 mg/dL (ref 0.0–40.0)

## 2021-10-20 LAB — CBC WITH DIFFERENTIAL/PLATELET
Basophils Absolute: 0.1 10*3/uL (ref 0.0–0.1)
Basophils Relative: 0.9 % (ref 0.0–3.0)
Eosinophils Absolute: 0.4 10*3/uL (ref 0.0–0.7)
Eosinophils Relative: 4.3 % (ref 0.0–5.0)
HCT: 51.1 % (ref 39.0–52.0)
Hemoglobin: 17 g/dL (ref 13.0–17.0)
Lymphocytes Relative: 23.1 % (ref 12.0–46.0)
Lymphs Abs: 1.9 10*3/uL (ref 0.7–4.0)
MCHC: 33.3 g/dL (ref 30.0–36.0)
MCV: 93.9 fl (ref 78.0–100.0)
Monocytes Absolute: 0.7 10*3/uL (ref 0.1–1.0)
Monocytes Relative: 8.6 % (ref 3.0–12.0)
Neutro Abs: 5.3 10*3/uL (ref 1.4–7.7)
Neutrophils Relative %: 63.1 % (ref 43.0–77.0)
Platelets: 325 10*3/uL (ref 150.0–400.0)
RBC: 5.44 Mil/uL (ref 4.22–5.81)
RDW: 14.2 % (ref 11.5–15.5)
WBC: 8.4 10*3/uL (ref 4.0–10.5)

## 2021-10-20 LAB — PSA: PSA: 1.35 ng/mL (ref 0.10–4.00)

## 2021-10-20 LAB — VITAMIN B12: Vitamin B-12: 264 pg/mL (ref 211–911)

## 2021-10-20 LAB — HEPATIC FUNCTION PANEL
ALT: 42 U/L (ref 0–53)
AST: 36 U/L (ref 0–37)
Albumin: 4.1 g/dL (ref 3.5–5.2)
Alkaline Phosphatase: 71 U/L (ref 39–117)
Bilirubin, Direct: 0.2 mg/dL (ref 0.0–0.3)
Total Bilirubin: 1.2 mg/dL (ref 0.2–1.2)
Total Protein: 7.9 g/dL (ref 6.0–8.3)

## 2021-10-20 LAB — HEMOGLOBIN A1C: Hgb A1c MFr Bld: 5.6 % (ref 4.6–6.5)

## 2021-10-20 LAB — TSH: TSH: 3.55 u[IU]/mL (ref 0.35–5.50)

## 2021-10-20 LAB — VITAMIN D 25 HYDROXY (VIT D DEFICIENCY, FRACTURES): VITD: 23.54 ng/mL — ABNORMAL LOW (ref 30.00–100.00)

## 2021-10-20 MED ORDER — ZOLPIDEM TARTRATE 10 MG PO TABS: 10.0000 mg | ORAL_TABLET | Freq: Every evening | ORAL | 2 refills | Status: DC | PRN

## 2021-10-20 NOTE — Progress Notes (Signed)
Subjective:    Patient ID: Michael Bernard, male    DOB: 1956-06-01, 65 y.o.   MRN: [id number]  HPI Here for a well exam. His only complaint is constant fatigue, which has been an issue for the past year. He thinks a lot of this is due to poor sleep. He has tried Trazodone and Temazepam in the past with poor results. He had a cardiac catheterization in July when he was having chest pains. This showed only a 20% lesion in the proximal RCA. We felt the chest pains were from GERD and these have resolved since he is taking both Prilosec and Pepcid.    Review of Systems  Constitutional:  Positive for fatigue. Negative for appetite change, fever and unexpected weight change.  HENT: Negative.    Eyes: Negative.   Respiratory: Negative.    Cardiovascular: Negative.   Gastrointestinal: Negative.   Genitourinary: Negative.   Musculoskeletal: Negative.   Skin: Negative.   Neurological: Negative.   Psychiatric/Behavioral: Negative.        Objective:   Physical Exam Constitutional:      General: He is not in acute distress.    Appearance: Normal appearance. He is well-developed. He is not diaphoretic.  HENT:     Head: Normocephalic and atraumatic.     Right Ear: External ear normal.     Left Ear: External ear normal.     Nose: Nose normal.     Mouth/Throat:     Pharynx: No oropharyngeal exudate.  Eyes:     General: No scleral icterus.       Right eye: No discharge.        Left eye: No discharge.     Conjunctiva/sclera: Conjunctivae normal.     Pupils: Pupils are equal, round, and reactive to light.  Neck:     Thyroid: No thyromegaly.     Vascular: No JVD.     Trachea: No tracheal deviation.  Cardiovascular:     Rate and Rhythm: Normal rate and regular rhythm.     Heart sounds: Normal heart sounds. No murmur heard.   No friction rub. No gallop.  Pulmonary:     Effort: Pulmonary effort is normal. No respiratory distress.     Breath sounds: Normal breath sounds. No wheezing or  rales.  Chest:     Chest wall: No tenderness.  Abdominal:     General: Bowel sounds are normal. There is no distension.     Palpations: Abdomen is soft. There is no mass.     Tenderness: There is no abdominal tenderness. There is no guarding or rebound.  Genitourinary:    Penis: Normal. No tenderness.      Testes: Normal.     Prostate: Normal.     Rectum: Normal. Guaiac result negative.  Musculoskeletal:        General: No tenderness. Normal range of motion.     Cervical back: Neck supple.  Lymphadenopathy:     Cervical: No cervical adenopathy.  Skin:    General: Skin is warm and dry.     Coloration: Skin is not pale.     Findings: No erythema or rash.  Neurological:     Mental Status: He is alert and oriented to person, place, and time.     Cranial Nerves: No cranial nerve deficit.     Motor: No abnormal muscle tone.     Coordination: Coordination normal.     Deep Tendon Reflexes: Reflexes are normal and symmetric. Reflexes normal.  Psychiatric:        Behavior: Behavior normal.        Thought Content: Thought content normal.        Judgment: Judgment normal.          Assessment & Plan:  Well exam. We discussed diet and exercise. Get fasting labs. For the insomnia he will try Zolpidem 10 mg at bedtime.  Alysia Penna, MD

## 2021-11-30 ENCOUNTER — Other Ambulatory Visit: Payer: Self-pay | Admitting: Family Medicine

## 2021-11-30 ENCOUNTER — Encounter: Payer: Self-pay | Admitting: Family Medicine

## 2021-11-30 ENCOUNTER — Ambulatory Visit: Payer: BC Managed Care – PPO | Admitting: Family Medicine

## 2021-11-30 VITALS — BP 142/84 | HR 72 | Temp 98.6°F | Wt 235.0 lb

## 2021-11-30 DIAGNOSIS — R42 Dizziness and giddiness: Secondary | ICD-10-CM | POA: Diagnosis not present

## 2021-11-30 DIAGNOSIS — R5382 Chronic fatigue, unspecified: Secondary | ICD-10-CM | POA: Diagnosis not present

## 2021-11-30 DIAGNOSIS — E538 Deficiency of other specified B group vitamins: Secondary | ICD-10-CM | POA: Diagnosis not present

## 2021-11-30 MED ORDER — CYANOCOBALAMIN 1000 MCG/ML IJ SOLN
1000.0000 ug | Freq: Once | INTRAMUSCULAR | Status: AC
  Administered 2021-11-30: 1000 ug via INTRAMUSCULAR

## 2021-11-30 MED ORDER — MECLIZINE HCL 25 MG PO TABS: 25.0000 mg | ORAL_TABLET | ORAL | 5 refills | Status: DC | PRN

## 2021-11-30 MED ORDER — CYANOCOBALAMIN 1000 MCG/ML IJ SOLN: INTRAMUSCULAR | 1 refills | Status: DC

## 2021-11-30 MED ORDER — "SYRINGE 25G X 1-1/2"" 3 ML MISC": 1.0000 "application " | 0 refills | Status: DC

## 2021-11-30 NOTE — Progress Notes (Signed)
° °  Subjective:    Patient ID: Michael Bernard, male    DOB: August 29, 1956, 66 y.o.   MRN: [id number]  HPI Here for 2 issues. First he has developed another bout of dizziness in the past 3 weeks. This makes it seem like the room is spinning. He had a bed spell of this a few years ago, and he had a CT angiogram of the head on 04-25-20 to evaluate it. This was normal. Lately he predictably gets dizzy when he lies on his left side. No headaches. He also continues to have constant fatigue. He had a well exam with labs on 10-20-21 and these were unremarkable. His B12 was at the low end of normal at 264.   Review of Systems  Constitutional:  Positive for fatigue.  Respiratory: Negative.    Cardiovascular: Negative.   Neurological:  Positive for dizziness. Negative for tremors, seizures, syncope, facial asymmetry, speech difficulty, weakness, light-headedness, numbness and headaches.      Objective:   Physical Exam Constitutional:      General: He is not in acute distress.    Appearance: Normal appearance.  Cardiovascular:     Rate and Rhythm: Normal rate and regular rhythm.     Pulses: Normal pulses.     Heart sounds: Normal heart sounds.  Pulmonary:     Effort: Pulmonary effort is normal.     Breath sounds: Normal breath sounds.  Lymphadenopathy:     Cervical: No cervical adenopathy.  Neurological:     General: No focal deficit present.     Mental Status: He is alert and oriented to person, place, and time. Mental status is at baseline.          Assessment & Plan:  For the fatigue, we will see if supplementing the B12 helps. He will begin taking B12 shots weekly for 12 weeks, then we will recheck a level. For the vertigo, he can use Meclizine as needed and we will refer him for vestibular rehab in [place].  We spent a total of ( 30  ) minutes reviewing records and discussing these issues.  Alysia Penna, MD

## 2021-11-30 NOTE — Addendum Note (Signed)
Addended by: Wyvonne Lenz on: 11/30/2021 01:34 PM   Modules accepted: Orders

## 2021-12-01 ENCOUNTER — Other Ambulatory Visit: Payer: Self-pay

## 2021-12-01 MED ORDER — CYANOCOBALAMIN 1000 MCG/ML IJ SOLN: INTRAMUSCULAR | 1 refills | Status: DC

## 2021-12-01 NOTE — Telephone Encounter (Signed)
Pt pharmacy wants clarification on the dose for B12 injections, ok to request 1,000 mg/mL weekly

## 2021-12-01 NOTE — Telephone Encounter (Signed)
Give 1000 mcg (or 1 ml) weekly

## 2021-12-01 NOTE — Telephone Encounter (Signed)
Dosage on pt B12 injection changed per Dr Sarajane Jews, resend again for refill

## 2021-12-29 IMAGING — CT CT ANGIO HEAD
2 of 11 series · 4 of 33 positions shown · IV contrast (iopamidol)
Comparison: None.

CLINICAL DATA: Cerebral hemorrhage suspected. Episodes of severe
vertigo over the last 2 weeks.

EXAM:
CT ANGIOGRAPHY HEAD AND NECK
TECHNIQUE: Multidetector CT imaging of the head and neck was performed using
the standard protocol during bolus administration of intravenous
contrast. Multiplanar CT image reconstructions and MIPs were
obtained to evaluate the vascular anatomy. Carotid stenosis
measurements (when applicable) are obtained utilizing NASCET
criteria, using the distal internal carotid diameter as the
denominator.
CONTRAST:  75mL ZSNARD-B8C IOPAMIDOL (ZSNARD-B8C) INJECTION 76%

[Series 12: brain 3.00 hr40 s3 sag without ibhc · sagittal · non-contrast · 0.37mm/px · 1 of 65 slices shown]
[im 33/65  soft-tissue]
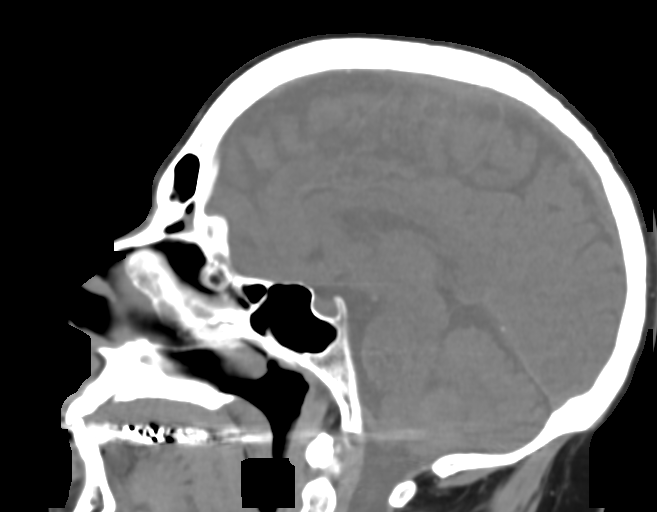

[Series 15: cta head & neck 1.00 hv48 s3 ax thin mips · axial · 0.42mm/px · z∈[-811,-459]mm · 3 of 353 slices shown]
[im 1/353  soft-tissue]
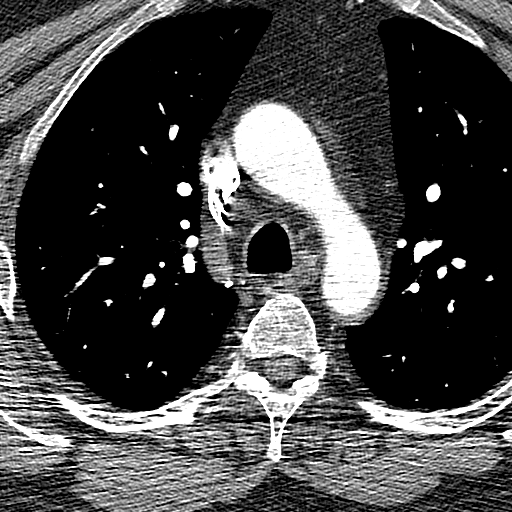
[im 177/353  bone]
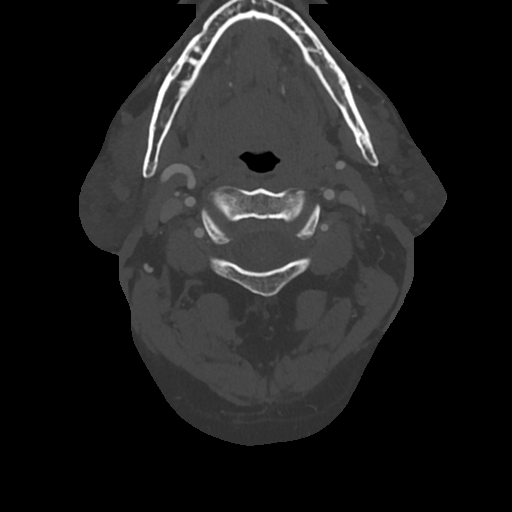
[im 353/353  soft-tissue]
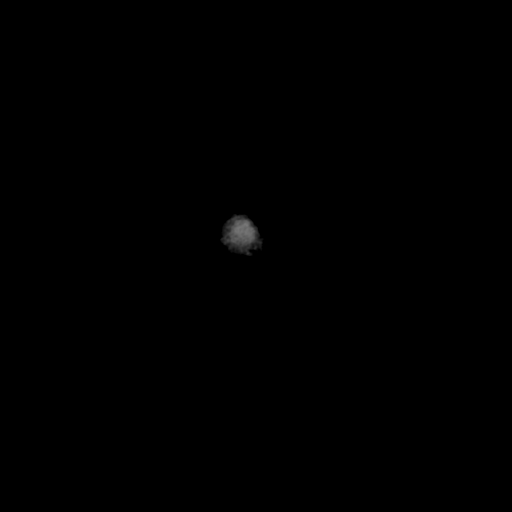

[4 of 33 positions shown; findings below may reference images not displayed]

FINDINGS: CT HEAD FINDINGS

Brain: The brain shows a normal appearance without evidence of
malformation, atrophy, old or acute small or large vessel
infarction, mass lesion, hemorrhage, hydrocephalus or extra-axial
collection.

Vascular: No abnormal vascular finding.

Skull: Normal.  No traumatic finding.  No focal bone lesion.

Sinuses/Orbits: Sinuses are clear. Orbits appear normal. Mastoids
are clear.

Other: None significant

CTA NECK FINDINGS

Aortic arch: Aortic arch is normal. Branching pattern is normal. No
origin stenosis.

Right carotid system: Common carotid artery widely patent to the
bifurcation. Carotid bifurcation is normal without soft or calcified
plaque. Cervical ICA is normal.

Left carotid system: Common carotid artery widely patent to the
bifurcation. Carotid bifurcation is normal without soft or calcified
plaque. Cervical ICA is tortuous but otherwise normal.

Vertebral arteries: Both vertebral artery origins are widely patent.
Both vertebral arteries appear normal through the cervical region to
the foramen magnum.

Skeleton: Normal

Other neck: No mass or lymphadenopathy. Few small thyroid nodules,
the largest measuring 7 mm on the left. No concerning feature.

Upper chest: Minor emphysematous changes.

Review of the MIP images confirms the above findings

CTA HEAD FINDINGS

Anterior circulation: Both internal carotid arteries are widely
patent through the skull base and siphon regions. No stenosis. The
anterior and middle cerebral vessels are normal without proximal
stenosis, aneurysm or vascular malformation. No large or medium
vessel occlusion.

Posterior circulation: Both vertebral arteries widely patent to the
basilar. No basilar stenosis. Posterior circulation branch vessels
are normal.

Venous sinuses: Patent and normal.

Anatomic variants: None significant.

Review of the MIP images confirms the above findings
IMPRESSION: Normal head CT.

Normal CT angiography of the neck vessels.

Normal CT angiography of the intracranial vessels.

## 2021-12-29 IMAGING — CT CT ANGIO NECK
2 of 11 series · 4 of 33 positions shown · IV contrast (iopamidol)
Comparison: None.

CLINICAL DATA: Cerebral hemorrhage suspected. Episodes of severe
vertigo over the last 2 weeks.

EXAM:
CT ANGIOGRAPHY HEAD AND NECK
TECHNIQUE: Multidetector CT imaging of the head and neck was performed using
the standard protocol during bolus administration of intravenous
contrast. Multiplanar CT image reconstructions and MIPs were
obtained to evaluate the vascular anatomy. Carotid stenosis
measurements (when applicable) are obtained utilizing NASCET
criteria, using the distal internal carotid diameter as the
denominator.
CONTRAST:  75mL ZSNARD-B8C IOPAMIDOL (ZSNARD-B8C) INJECTION 76%

[Series 12: brain 3.00 hr40 s3 sag without ibhc · sagittal · non-contrast · 0.37mm/px · 1 of 65 slices shown]
[im 33/65  soft-tissue]
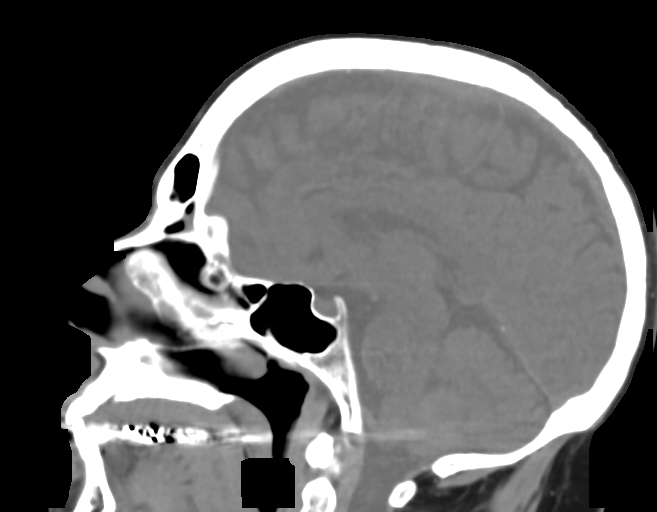

[Series 15: cta head & neck 1.00 hv48 s3 ax thin mips · axial · 0.42mm/px · z∈[-811,-459]mm · 3 of 353 slices shown]
[im 1/353  soft-tissue]
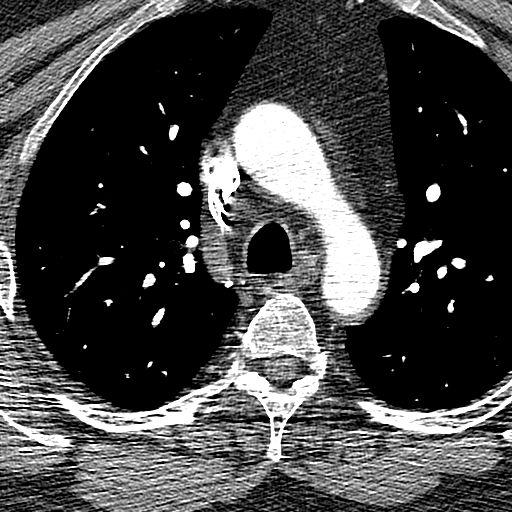
[im 177/353  bone]
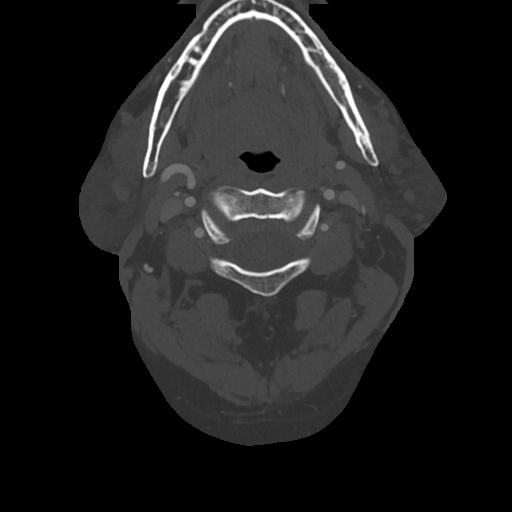
[im 353/353  soft-tissue]
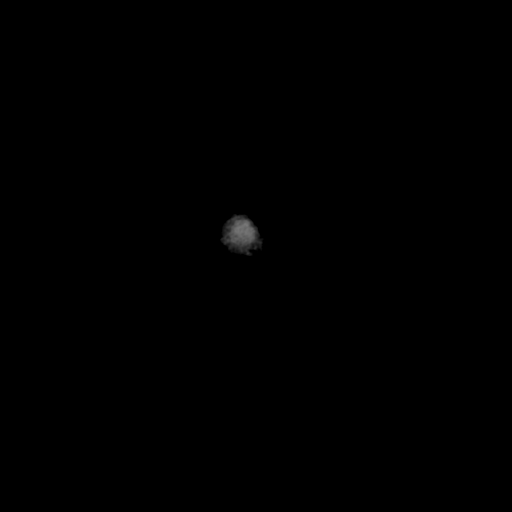

[4 of 33 positions shown; findings below may reference images not displayed]

FINDINGS: CT HEAD FINDINGS

Brain: The brain shows a normal appearance without evidence of
malformation, atrophy, old or acute small or large vessel
infarction, mass lesion, hemorrhage, hydrocephalus or extra-axial
collection.

Vascular: No abnormal vascular finding.

Skull: Normal.  No traumatic finding.  No focal bone lesion.

Sinuses/Orbits: Sinuses are clear. Orbits appear normal. Mastoids
are clear.

Other: None significant

CTA NECK FINDINGS

Aortic arch: Aortic arch is normal. Branching pattern is normal. No
origin stenosis.

Right carotid system: Common carotid artery widely patent to the
bifurcation. Carotid bifurcation is normal without soft or calcified
plaque. Cervical ICA is normal.

Left carotid system: Common carotid artery widely patent to the
bifurcation. Carotid bifurcation is normal without soft or calcified
plaque. Cervical ICA is tortuous but otherwise normal.

Vertebral arteries: Both vertebral artery origins are widely patent.
Both vertebral arteries appear normal through the cervical region to
the foramen magnum.

Skeleton: Normal

Other neck: No mass or lymphadenopathy. Few small thyroid nodules,
the largest measuring 7 mm on the left. No concerning feature.

Upper chest: Minor emphysematous changes.

Review of the MIP images confirms the above findings

CTA HEAD FINDINGS

Anterior circulation: Both internal carotid arteries are widely
patent through the skull base and siphon regions. No stenosis. The
anterior and middle cerebral vessels are normal without proximal
stenosis, aneurysm or vascular malformation. No large or medium
vessel occlusion.

Posterior circulation: Both vertebral arteries widely patent to the
basilar. No basilar stenosis. Posterior circulation branch vessels
are normal.

Venous sinuses: Patent and normal.

Anatomic variants: None significant.

Review of the MIP images confirms the above findings
IMPRESSION: Normal head CT.

Normal CT angiography of the neck vessels.

Normal CT angiography of the intracranial vessels.

## 2022-01-17 ENCOUNTER — Encounter: Payer: Self-pay | Admitting: Family Medicine

## 2022-01-22 ENCOUNTER — Other Ambulatory Visit: Payer: Self-pay | Admitting: Family Medicine

## 2022-01-25 NOTE — Telephone Encounter (Signed)
Last refill-10/20/21--30 tabs, 2 refills ?Last OV- 11/30/2021 ? ?No future OV scheduled ?

## 2022-02-16 ENCOUNTER — Other Ambulatory Visit: Payer: Self-pay | Admitting: Family Medicine

## 2022-02-21 ENCOUNTER — Other Ambulatory Visit: Payer: Self-pay | Admitting: Family Medicine

## 2022-04-27 DIAGNOSIS — J069 Acute upper respiratory infection, unspecified: Secondary | ICD-10-CM | POA: Diagnosis not present

## 2022-04-27 DIAGNOSIS — R0981 Nasal congestion: Secondary | ICD-10-CM | POA: Diagnosis not present

## 2022-06-14 ENCOUNTER — Encounter: Payer: Self-pay | Admitting: Gastroenterology

## 2022-06-24 ENCOUNTER — Telehealth: Payer: Self-pay | Admitting: Gastroenterology

## 2022-06-24 NOTE — Telephone Encounter (Signed)
Pt made aware of Dr. Lyndel Safe recommendations: EGD added on to schedule:  Courtney from the [facility] made aware: Previsit appointment note edited

## 2022-06-24 NOTE — Telephone Encounter (Signed)
Pl go ahead with EGD/colon RG

## 2022-06-24 NOTE — Telephone Encounter (Signed)
Pt requesting to have an EGD added on to colonoscopy that is already scheduled  Pt has a previsit appointment on 07/06/2022 Pt has a colonoscopy scheduled on 07/20/2022 with Dr. Lyndel Safe  Pt states that he had an EGD several years back with Dr. Lyndel Safe in [place] which pt stated that he had a hiatal hernia, 3 ulcers, and acid reflux: Pt states that he has been on medication for the reflux but feels that it is getting worse and to the point that he started having numbness on his tongue and went to his dentist and his dentist notified him that this could be coming from the reflux that he has: Please advise if you would like to add on EGD or if you would like to see pt in the office for an office visit:

## 2022-06-24 NOTE — Telephone Encounter (Signed)
This patient is from [place] and he requested to ask Dr. Lyndel Safe if he can be seen prior to his colonoscopy for possibly add an EGD.

## 2022-07-06 ENCOUNTER — Ambulatory Visit (AMBULATORY_SURGERY_CENTER): Payer: Self-pay

## 2022-07-06 VITALS — Ht 67.5 in | Wt 229.0 lb

## 2022-07-06 DIAGNOSIS — Z86010 Personal history of colonic polyps: Secondary | ICD-10-CM

## 2022-07-06 MED ORDER — NA SULFATE-K SULFATE-MG SULF 17.5-3.13-1.6 GM/177ML PO SOLN
1.0000 | Freq: Once | ORAL | 0 refills | Status: AC
Start: 2022-07-06 — End: 2022-07-06

## 2022-07-06 NOTE — Progress Notes (Signed)
No egg or soy allergy known to patient  No issues known to pt with past sedation with any surgeries or procedures Patient denies ever being told they had issues or difficulty with intubation  No FH of Malignant Hyperthermia Pt is not on diet pills Pt is not on home 02  Pt is not on blood thinners  Pt denies issues with constipation  No A fib or A flutter Have any cardiac testing pending--NO Pt instructed to use Single[email or web address] or GoodRx for a price reduction on prep  Wife came along with patient to PV appt;

## 2022-07-09 ENCOUNTER — Ambulatory Visit: Payer: BC Managed Care – PPO | Admitting: Family Medicine

## 2022-07-09 ENCOUNTER — Encounter: Payer: Self-pay | Admitting: Family Medicine

## 2022-07-09 VITALS — BP 150/88 | HR 70 | Temp 98.4°F | Wt 227.8 lb

## 2022-07-09 DIAGNOSIS — K140 Glossitis: Secondary | ICD-10-CM

## 2022-07-09 LAB — CBC WITH DIFFERENTIAL/PLATELET
Basophils Absolute: 0.1 10*3/uL (ref 0.0–0.1)
Basophils Relative: 0.9 % (ref 0.0–3.0)
Eosinophils Absolute: 0.2 10*3/uL (ref 0.0–0.7)
Eosinophils Relative: 2.3 % (ref 0.0–5.0)
HCT: 49.1 % (ref 39.0–52.0)
Hemoglobin: 16.4 g/dL (ref 13.0–17.0)
Lymphocytes Relative: 22.6 % (ref 12.0–46.0)
Lymphs Abs: 1.9 10*3/uL (ref 0.7–4.0)
MCHC: 33.4 g/dL (ref 30.0–36.0)
MCV: 94.5 fl (ref 78.0–100.0)
Monocytes Absolute: 0.6 10*3/uL (ref 0.1–1.0)
Monocytes Relative: 7.2 % (ref 3.0–12.0)
Neutro Abs: 5.7 10*3/uL (ref 1.4–7.7)
Neutrophils Relative %: 67 % (ref 43.0–77.0)
Platelets: 317 10*3/uL (ref 150.0–400.0)
RBC: 5.2 Mil/uL (ref 4.22–5.81)
RDW: 14.1 % (ref 11.5–15.5)
WBC: 8.5 10*3/uL (ref 4.0–10.5)

## 2022-07-09 LAB — VITAMIN B12: Vitamin B-12: 1047 pg/mL — ABNORMAL HIGH (ref 211–911)

## 2022-07-09 LAB — FOLATE: Folate: >24.2 ng/mL (ref >5.9)

## 2022-07-09 LAB — BASIC METABOLIC PANEL
BUN: 11 mg/dL (ref 6–23)
CO2: 28 mEq/L (ref 19–32)
Calcium: 9.3 mg/dL (ref 8.4–10.5)
Chloride: 102 mEq/L (ref 96–112)
Creatinine, Ser: 1.01 mg/dL (ref 0.40–1.50)
GFR: 78.4 mL/min (ref >60.00)
Glucose, Bld: 88 mg/dL (ref 70–99)
Potassium: 3.7 mEq/L (ref 3.5–5.1)
Sodium: 138 mEq/L (ref 135–145)

## 2022-07-09 LAB — TSH: TSH: 2.31 u[IU]/mL (ref 0.35–5.50)

## 2022-07-09 NOTE — Progress Notes (Signed)
   Subjective:    Patient ID: Michael Bernard, male    DOB: August 08, 1956, 66 y.o.   MRN: [id number]  HPI Here for 3 months of constant irritation and burning in his mouth, particularly the tongue. He has to avoid eating spicy foods because it burns too badly. He saw his dentist in July, and he gave him a mouthwash to swish with. This did not help at all. All the foods he eats taste different. He has been getting vitamin B shots since last winter. Otherwise no new medications or supplements.    Review of Systems  Constitutional: Negative.   HENT:  Positive for sore throat. Negative for congestion, mouth sores, postnasal drip, trouble swallowing and voice change.   Respiratory: Negative.    Cardiovascular: Negative.        Objective:   Physical Exam Constitutional:      General: He is not in acute distress.    Appearance: Normal appearance.  HENT:     Nose: Nose normal.     Mouth/Throat:     Comments: His tongue is more red than usual and it has deep fissures along it. The cheeks and palates appear normal  Eyes:     Conjunctiva/sclera: Conjunctivae normal.  Cardiovascular:     Rate and Rhythm: Normal rate and regular rhythm.     Pulses: Normal pulses.     Heart sounds: Normal heart sounds.  Pulmonary:     Effort: Pulmonary effort is normal.     Breath sounds: Normal breath sounds.  Neurological:     Mental Status: He is alert.           Assessment & Plan:  Glossitis. We will check labs to day including vitamins B12, B3, and B2.  Alysia Penna, MD

## 2022-07-12 ENCOUNTER — Encounter: Payer: Self-pay | Admitting: Gastroenterology

## 2022-07-12 ENCOUNTER — Telehealth: Payer: Self-pay | Admitting: Gastroenterology

## 2022-07-12 NOTE — Telephone Encounter (Signed)
Pt stated that his PCP requested that he cancel his EGD at this time and only proceed with Colonoscopy: Pt offered to reschedule them both but denied. Pt stated that he wanted tyo proceed with Colonoscopy as scheduled and Cancel EGD.  EGD canceled: Pt made aware Pt verbalized understanding with all questions answered.

## 2022-07-12 NOTE — Telephone Encounter (Signed)
Inbound call from patient stating he would like to cancel upcoming double procedure for edg and just have the colonoscopy procedure on 07/20/22. Patient states his PCP would like for him to wait on edg instead. Please give a call to further advise.  Thank you

## 2022-07-13 ENCOUNTER — Encounter: Payer: Self-pay | Admitting: Family Medicine

## 2022-07-13 NOTE — Telephone Encounter (Signed)
Yes he can stop the B12 shots. We are still waiting for the results for B2 and B3 vitamins. If these are normal, I suggest we refer him to an ENT

## 2022-07-15 LAB — VITAMIN B3
Nicotinamide: 49 ng/mL
Nicotinic Acid: <20 ng/mL

## 2022-07-15 LAB — VITAMIN B2(RIBOFLAVIN),PLASMA: Vitamin B2(Riboflavin),Plasma: 62.6 nmol/L — ABNORMAL HIGH (ref 6.2–39.0)

## 2022-07-17 ENCOUNTER — Encounter: Payer: Self-pay | Admitting: Family Medicine

## 2022-07-17 DIAGNOSIS — J3089 Other allergic rhinitis: Secondary | ICD-10-CM

## 2022-07-19 NOTE — Telephone Encounter (Signed)
Please advise 

## 2022-07-20 ENCOUNTER — Encounter: Payer: Self-pay | Admitting: Gastroenterology

## 2022-07-20 ENCOUNTER — Ambulatory Visit (AMBULATORY_SURGERY_CENTER): Payer: BC Managed Care – PPO | Admitting: Gastroenterology

## 2022-07-20 ENCOUNTER — Encounter: Payer: BC Managed Care – PPO | Admitting: Gastroenterology

## 2022-07-20 VITALS — BP 104/73 | HR 57 | Temp 96.8°F | Resp 15 | Ht 67.0 in | Wt 229.0 lb

## 2022-07-20 DIAGNOSIS — D120 Benign neoplasm of cecum: Secondary | ICD-10-CM | POA: Diagnosis not present

## 2022-07-20 DIAGNOSIS — K640 First degree hemorrhoids: Secondary | ICD-10-CM

## 2022-07-20 DIAGNOSIS — D125 Benign neoplasm of sigmoid colon: Secondary | ICD-10-CM

## 2022-07-20 DIAGNOSIS — Z09 Encounter for follow-up examination after completed treatment for conditions other than malignant neoplasm: Secondary | ICD-10-CM | POA: Diagnosis not present

## 2022-07-20 DIAGNOSIS — Z86010 Personal history of colonic polyps: Secondary | ICD-10-CM | POA: Diagnosis not present

## 2022-07-20 DIAGNOSIS — K5730 Diverticulosis of large intestine without perforation or abscess without bleeding: Secondary | ICD-10-CM | POA: Diagnosis not present

## 2022-07-20 DIAGNOSIS — Z1211 Encounter for screening for malignant neoplasm of colon: Secondary | ICD-10-CM | POA: Diagnosis not present

## 2022-07-20 HISTORY — PX: COLONOSCOPY: SHX174

## 2022-07-20 MED ORDER — SODIUM CHLORIDE 0.9 % IV SOLN: 500.0000 mL | Freq: Once | INTRAVENOUS | Status: DC

## 2022-07-20 NOTE — Op Note (Signed)
[place] Patient Name: Michael Bernard Procedure Date: 07/20/2022 8:34 AM MRN: [id number] Endoscopist: Jackquline Denmark , MD Age: 66 Referring MD:  Date of Birth: 1956-07-10 Gender: Male Account #: [id number] Procedure:                Colonoscopy Indications:              High risk colon cancer surveillance: Personal                            history of colonic polyps Medicines:                Monitored Anesthesia Care Procedure:                Pre-Anesthesia Assessment:                           - Prior to the procedure, a History and Physical                            was performed, and patient medications and                            allergies were reviewed. The patient's tolerance of                            previous anesthesia was also reviewed. The risks                            and benefits of the procedure and the sedation                            options and risks were discussed with the patient.                            All questions were answered, and informed consent                            was obtained. Prior Anticoagulants: The patient has                            taken no previous anticoagulant or antiplatelet                            agents. ASA Grade Assessment: III - A patient with                            severe systemic disease. After reviewing the risks                            and benefits, the patient was deemed in                            satisfactory condition to undergo the procedure.  After obtaining informed consent, the colonoscope                            was passed under direct vision. Throughout the                            procedure, the patient's blood pressure, pulse, and                            oxygen saturations were monitored continuously. The                            Olympus CF-HQ190L [id number] [id number]) Colonoscope was                            introduced through the anus and advanced to  the the                            cecum, identified by appendiceal orifice and                            ileocecal valve. The colonoscopy was performed                            without difficulty. The patient tolerated the                            procedure well. The quality of the bowel                            preparation was good. The ileocecal valve,                            appendiceal orifice, and rectum were photographed. Scope In: 8:44:06 AM Scope Out: 8:57:55 AM Scope Withdrawal Time: 0 hours 11 minutes 47 seconds  Total Procedure Duration: 0 hours 13 minutes 49 seconds  Findings:                 A 10 mm polyp was found in the cecum just behind                            the ileocecal valve. The polyp was sessile. The                            polyp was removed with a cold snare. Resection and                            retrieval were complete.                           A 2 mm polyp was found in the mid sigmoid colon.                            The polyp was sessile.  The polyp was removed with a                            cold snare. Resection and retrieval were complete.                           A few medium-mouthed diverticula were found in the                            sigmoid colon.                           Non-bleeding internal hemorrhoids were found during                            retroflexion. The hemorrhoids were small and Grade                            I (internal hemorrhoids that do not prolapse).                           The exam was otherwise without abnormality on                            direct and retroflexion views. Complications:            No immediate complications. Estimated Blood Loss:     Estimated blood loss: none. Impression:               - One 10 mm polyp in the cecum, removed with a cold                            snare. Resected and retrieved.                           - One 2 mm polyp in the mid sigmoid colon, removed                             with a cold snare. Resected and retrieved.                           - Mild sigmoid diverticulosis.                           - Non-bleeding internal hemorrhoids.                           - The examination was otherwise normal on direct                            and retroflexion views. Recommendation:           - Patient has a contact number available for                            emergencies. The signs and symptoms of potential  delayed complications were discussed with the                            patient. Return to normal activities tomorrow.                            Written discharge instructions were provided to the                            patient.                           - Resume previous diet.                           - Continue present medications.                           - Await pathology results.                           - Repeat colonoscopy for surveillance based on                            pathology results.                           - The findings and recommendations were discussed                            with the patient's family. Jackquline Denmark, MD 07/20/2022 9:03:39 AM This report has been signed electronically.

## 2022-07-20 NOTE — Progress Notes (Signed)
[facility] History and Physical   Primary Care Physician:  [name], MD   Reason for Procedure:    history of polyps  Plan:     colonoscopy     HPI: Michael Bernard is a 66 y.o. male    Past Medical History:  Diagnosis Date   Anxiety    situational anxiety   Arthritis    LEFT knee   Asthma    Cancer ([place]), Melanoma    skin x 3-4 times   GERD (gastroesophageal reflux disease)    on meds   Hiatal hernia    Kidney stones    Multiple gastric ulcers 2015   seen on endoscopy per Dr. Lyndel Safe in [place]    Seasonal allergies    Sleep apnea    sees Dr. Keturah Barre, uses CPAP     Past Surgical History:  Procedure Laterality Date   COLONOSCOPY  2015   per Dr. Lyndel Safe in [place], benign polyps, repeat in 7 yrs    COLONOSCOPY  2017   RG-F/V-prepopik(good)-hems/TA   HERNIA REPAIR Bilateral    inguinal hernias repaired with mesh   KNEE SURGERY Left    arthritis and meniscal tear   LEFT HEART CATH AND CORONARY ANGIOGRAPHY N/A 05/22/2021   Procedure: LEFT HEART CATH AND CORONARY ANGIOGRAPHY;  Surgeon: Burnell Blanks, MD;  Location: [facility];  Service: Cardiovascular;  Laterality: N/A;   MELANOMA EXCISION     per Dr. Jimmye Norman in [place], 3 removed from both shoulders and left temple    POLYPECTOMY  2017   TA    Prior to Admission medications   Medication Sig Start Date End Date Taking? Authorizing Provider  APPLE CIDER VINEGAR PO Take 1 tablet by mouth daily at 6 (six) AM. GUMMY   Yes [provider]  zolpidem (AMBIEN) 10 MG tablet TAKE 1 TABLET BY MOUTH AT BEDTIME AS NEEDED FOR SLEEP. 01/25/22  Yes [name], MD  cyanocobalamin (,VITAMIN B-12,) 1000 MCG/ML injection GIVE YOURSELF 1000 MG/ML (1 ML ) ONCE A WEEK FOR 12 WEEKS. 02/22/22   [name], MD  omeprazole (PRILOSEC) 40 MG capsule TAKE ONE CAPSULE (40 MG TOTAL) BY MOUTH DAILY 02/16/22   [name], MD  Syringe/Needle, Disp, (SYRINGE 3CC/25GX1-1/2") 25G X 1-1/2"  3 ML MISC 1 application by Does not apply route once a week. 11/30/21   [name], MD    Current Outpatient Medications  Medication Sig Dispense Refill   APPLE CIDER VINEGAR PO Take 1 tablet by mouth daily at 6 (six) AM. GUMMY     zolpidem (AMBIEN) 10 MG tablet TAKE 1 TABLET BY MOUTH AT BEDTIME AS NEEDED FOR SLEEP. 30 tablet 5   cyanocobalamin (,VITAMIN B-12,) 1000 MCG/ML injection GIVE YOURSELF 1000 MG/ML (1 ML ) ONCE A WEEK FOR 12 WEEKS. 12 mL 1   omeprazole (PRILOSEC) 40 MG capsule TAKE ONE CAPSULE (40 MG TOTAL) BY MOUTH DAILY 90 capsule 2   Syringe/Needle, Disp, (SYRINGE 3CC/25GX1-1/2") 25G X 1-1/2" 3 ML MISC 1 application by Does not apply route once a week. 100 each 0   Current Facility-Administered Medications  Medication Dose Route Frequency Provider Last Rate Last Admin   0.9 %  sodium chloride infusion  500 mL Intravenous Once Jackquline Denmark, MD        Allergies as of 07/20/2022 - Review Complete 07/20/2022  Allergen Reaction Noted   Trazodone and nefazodone Other (See Comments) 10/20/2020   Penicillins Hives, Itching, and Rash  09/18/2007    Family History  Problem Relation Age of Onset   Colon polyps Mother    Arthritis Mother    Cancer Mother    Hyperlipidemia Mother    Hypertension Mother    Leukemia Mother    Atrial fibrillation Father    Heart attack Father    Heart disease Father    Hypertension Father    Hypertension Sister    Seizures Sister    Colon cancer Neg Hx    Esophageal cancer Neg Hx    Stomach cancer Neg Hx    Rectal cancer Neg Hx     Social History   Socioeconomic History   Marital status: Married    Spouse name: Not on file   Number of children: Not on file   Years of education: Not on file   Highest education level: Not on file  Occupational History   Occupation: Self-employed, used car lot, Museum/gallery exhibitions officer, Paramedic franchise  Tobacco Use   Smoking status: Former    Years: 3.00    Types: Cigarettes    Quit date: 06/11/1979    Years  since quitting: 43.1   Smokeless tobacco: Never   Tobacco comments:    pt reports smoker 0.5 pack/month as a teenager 06/11/2019  Vaping Use   Vaping Use: Never used  Substance and Sexual Activity   Alcohol use: Yes    Alcohol/week: 1.0 - 2.0 standard drink of alcohol    Types: 1 - 2 Cans of beer per week    Comment: 1-2 beers per month   Drug use: Never   Sexual activity: Yes  Other Topics Concern   Not on file  Social History Narrative   Not on file   Social Determinants of Health   Financial Resource Strain: Not on file  Food Insecurity: Not on file  Transportation Needs: Not on file  Physical Activity: Not on file  Stress: Not on file  Social Connections: Not on file  Intimate Partner Violence: Not on file    Review of Systems: Positive for none All other review of systems negative except as mentioned in the HPI.  Physical Exam: Vital signs in last 24 hours: '@VSRANGES'$ @   General:   Alert,  Well-developed, well-nourished, pleasant and cooperative in NAD Lungs:  Clear throughout to auscultation.   Heart:  Regular rate and rhythm; no murmurs, clicks, rubs,  or gallops. Abdomen:  Soft, nontender and nondistended. Normal bowel sounds.   Neuro/Psych:  Alert and cooperative. Normal mood and affect. A and O x 3    No significant changes were identified.  The patient continues to be an appropriate candidate for the planned procedure and anesthesia.   Carmell Austria, MD. [facility] 07/20/2022 8:39 AM@

## 2022-07-20 NOTE — Progress Notes (Signed)
PT taken to PACU. Monitors in place. VSS. Report given to RN. 

## 2022-07-20 NOTE — Progress Notes (Signed)
Called to room to assist during endoscopic procedure.  Patient ID and intended procedure confirmed with present staff. Received instructions for my participation in the procedure from the performing physician.  

## 2022-07-20 NOTE — Patient Instructions (Signed)
HANDOUTS ON HEMORRHOIDS, DIVERTICULOSIS AND POLYPS GIVEN.   YOU HAD AN ENDOSCOPIC PROCEDURE TODAY AT [facility]:   Refer to the procedure report that was given to you for any specific questions about what was found during the examination.  If the procedure report does not answer your questions, please call your gastroenterologist to clarify.  If you requested that your care partner not be given the details of your procedure findings, then the procedure report has been included in a sealed envelope for you to review at your convenience later.  YOU SHOULD EXPECT: Some feelings of bloating in the abdomen. Passage of more gas than usual.  Walking can help get rid of the air that was put into your GI tract during the procedure and reduce the bloating. If you had a lower endoscopy (such as a colonoscopy or flexible sigmoidoscopy) you may notice spotting of blood in your stool or on the toilet paper. If you underwent a bowel prep for your procedure, you may not have a normal bowel movement for a few days.  Please Note:  You might notice some irritation and congestion in your nose or some drainage.  This is from the oxygen used during your procedure.  There is no need for concern and it should clear up in a day or so.  SYMPTOMS TO REPORT IMMEDIATELY:  Following lower endoscopy (colonoscopy or flexible sigmoidoscopy):  Excessive amounts of blood in the stool  Significant tenderness or worsening of abdominal pains  Swelling of the abdomen that is new, acute  Fever of 100F or higher  For urgent or emergent issues, a gastroenterologist can be reached at any hour by calling [phone]. Do not use MyChart messaging for urgent concerns.    DIET:  We do recommend a small meal at first, but then you may proceed to your regular diet.  Drink plenty of fluids but you should avoid alcoholic beverages for 24 hours.  ACTIVITY:  You should plan to take it easy for the rest of today and you  should NOT DRIVE or use heavy machinery until tomorrow (because of the sedation medicines used during the test).    FOLLOW UP: Our staff will call the number listed on your records the next business day following your procedure.  We will call around 7:15- 8:00 am to check on you and address any questions or concerns that you may have regarding the information given to you following your procedure. If we do not reach you, we will leave a message.  If you develop any symptoms (ie: fever, flu-like symptoms, shortness of breath, cough etc.) before then, please call [phone].  If you test positive for Covid 19 in the 2 weeks post procedure, please call and report this information to [place].    If any biopsies were taken you will be contacted by phone or by letter within the next 1-3 weeks.  Please call us at [phone] if you have not heard about the biopsies in 3 weeks.    SIGNATURES/CONFIDENTIALITY: You and/or your care partner have signed paperwork which will be entered into your electronic medical record.  These signatures attest to the fact that that the information above on your After Visit Summary has been reviewed and is understood.  Full responsibility of the confidentiality of this discharge information lies with you and/or your care-partner.

## 2022-07-20 NOTE — Progress Notes (Signed)
Pt's states no medical or surgical changes since previsit or office visit. 

## 2022-07-21 ENCOUNTER — Telehealth: Payer: Self-pay

## 2022-07-21 NOTE — Telephone Encounter (Signed)
  Follow up Call-     07/20/2022    7:50 AM  Call back number  Post procedure Call Back phone  # [phone]  Permission to leave phone message Yes     Patient questions:  Do you have a fever, pain , or abdominal swelling? No. Pain Score  0 *  Have you tolerated food without any problems? Yes.    Have you been able to return to your normal activities? Yes.    Do you have any questions about your discharge instructions: Diet   No. Medications  No. Follow up visit  No.  Do you have questions or concerns about your Care? No.  Actions: * If pain score is 4 or above: No action needed, pain <4.

## 2022-07-24 ENCOUNTER — Encounter: Payer: Self-pay | Admitting: Gastroenterology

## 2022-07-26 DIAGNOSIS — T24222A Burn of second degree of left knee, initial encounter: Secondary | ICD-10-CM | POA: Diagnosis not present

## 2022-07-26 DIAGNOSIS — M25562 Pain in left knee: Secondary | ICD-10-CM | POA: Diagnosis not present

## 2022-07-27 ENCOUNTER — Other Ambulatory Visit: Payer: Self-pay | Admitting: Family Medicine
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
## 2022-07-28 ENCOUNTER — Ambulatory Visit (INDEPENDENT_AMBULATORY_CARE_PROVIDER_SITE_OTHER): Payer: BC Managed Care – PPO | Admitting: Family Medicine

## 2022-07-28 ENCOUNTER — Encounter: Payer: Self-pay | Admitting: Family Medicine

## 2022-07-28 VITALS — BP 130/70 | HR 9 | Temp 98.1°F | Ht 67.0 in | Wt 230.8 lb

## 2022-07-28 DIAGNOSIS — T24222A Burn of second degree of left knee, initial encounter: Secondary | ICD-10-CM

## 2022-07-28 DIAGNOSIS — L03116 Cellulitis of left lower limb: Secondary | ICD-10-CM | POA: Diagnosis not present

## 2022-07-28 MED ORDER — CEPHALEXIN 500 MG PO CAPS: 500.0000 mg | ORAL_CAPSULE | Freq: Four times a day (QID) | ORAL | 0 refills | Status: DC
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
## 2022-07-28 NOTE — Telephone Encounter (Signed)
Pt LOV was on 08/07/2022 Last refill was done on 01/25/2022 Please advise

## 2022-08-25 ENCOUNTER — Ambulatory Visit: Payer: Medicare HMO | Admitting: Internal Medicine

## 2022-08-25 ENCOUNTER — Encounter: Payer: Self-pay | Admitting: Internal Medicine

## 2022-08-25 VITALS — BP 130/90 | HR 70 | Temp 97.7°F | Resp 20 | Ht 67.32 in | Wt 224.7 lb

## 2022-08-25 DIAGNOSIS — K146 Glossodynia: Secondary | ICD-10-CM | POA: Diagnosis not present

## 2022-08-25 DIAGNOSIS — B999 Unspecified infectious disease: Secondary | ICD-10-CM | POA: Diagnosis not present

## 2022-08-25 DIAGNOSIS — J3089 Other allergic rhinitis: Secondary | ICD-10-CM | POA: Diagnosis not present

## 2022-08-25 DIAGNOSIS — J4520 Mild intermittent asthma, uncomplicated: Secondary | ICD-10-CM

## 2022-08-25 MED ORDER — FLUTICASONE PROPIONATE HFA 44 MCG/ACT IN AERO: 2.0000 | INHALATION_SPRAY | Freq: Two times a day (BID) | RESPIRATORY_TRACT | 6 refills | Status: DC

## 2022-08-25 MED ORDER — GABAPENTIN 600 MG PO TABS
600.0000 mg | ORAL_TABLET | Freq: Every day | ORAL | 1 refills | Status: DC
Start: 2022-08-25 — End: 2022-12-02

## 2022-08-25 NOTE — Progress Notes (Signed)
New Patient Note  RE: Michael Bernard MRN: [id number] DOB: 03/18/1956 Date of Office Visit: 08/25/2022  Consult requested by: [name], MD Primary care provider: [name], MD  Chief Complaint: Asthma (He also is having burning mouth syndrome)  History of Present Illness: I had the pleasure of seeing Michael Bernard for initial evaluation at the [facility] on 08/25/2022. He is a 66 y.o. male, who is referred here by [name], MD for the evaluation of asthma, burning mouth syndrome and allergic rhinitis.  History obtained from patient, chart review.   Burning Mouth Syndrome:  Diagnosed a few months ago after he developed feelings of "tongue on fire" or that he "bit his tongue".   He was initially treated with compounded mouth wash without good response.  He was seen by dentist who ruled out geographical tongue.   GI ruled out LPR.  Denies any foods that make it worth, but he does avoid spicy foods.   He was diagnosed with B12 deficiency which has been treated, without response to symptoms.    Asthma History:  -Diagnosed at age as a young adult after having multiple PNAs.  -Current symptoms include chest tightness, cough, and shortness of breath 3 times a week with exercise daytime symptoms in past month, 0 nighttime awakenings in past month Using rescue inhaler does not have any  -Limitations to daily activity:  severe due to post covid  - 0 ED visits, 0 UC visits and 1 oral steroids in the past year - 2 number of lifetime hospitalizations, 0 number of lifetime intubations.  - Identified Triggers: respiratory illness, smoke exposure, and cold air - Up-to-date with Covid-19, and Flu, vaccines. - History of prior pneumonias: 3 times as a child  - History of prior COVID-19 infection: 3 times (residual fatigue, dyspnea)  - Smoking exposure: prior smoker, also has a history of exposures as a welder  Previous Diagnostics:  - Prior PFTs or  spirometry: none to review  - Today's Asthma Control Test:  .   Management:  - Previously used therapies: Albuterol as needed.  - Current regimen:  - Maintenance: None - Rescue: Albuterol 2 puffs q4-6 hrs PRN, not using prior to exercise  Chronic rhinitis: started as a young adult  Symptoms include:  sinus infection , nasal congestion, rhinorrhea, and post nasal drainage  Occurs seasonally-spring time  Potential triggers: changes in weather, in spring  Treatments tried: antibiotices,  Previous allergy testing: no History of reflux/heartburn:  Yes on omeprazole daily as needed   He does have a history of gastric ulcers and hiatal hernia History of chronic sinusitis or sinus surgery: no Nonallergic triggers:  denies         Assessment and Plan: Michael Bernard is a 66 y.o. male with: Burning mouth syndrome  Mild intermittent asthma in adult without complication - Plan: Spirometry with Graph, Allergy Test, Interdermal Allergy Test, Immunoglobulins, QN, A/E/G/M, Strep pneumoniae 23 Serotypes IgG, Diphtheria / Tetanus Antibody Panel, Complement, total  Recurrent infections - Plan: Allergy Test, Interdermal Allergy Test, Immunoglobulins, QN, A/E/G/M, Strep pneumoniae 23 Serotypes IgG, Diphtheria / Tetanus Antibody Panel, Complement, total  Other allergic rhinitis Plan: Patient Instructions  Burning Tongue Syndrome  Burning tongue syndrome is usually associated with a neurological process Recommend starting gabapentin '600mg'$  at night, we can titrate up as needed  If no response may consider referral to ENT for tongue biopsy   Mild Intermittent Asthma:  not well controlled  -  Breathing test today showed: looked great!!  PLAN:  - Spacer sample and demonstration provided. - Daily controller medication(s): Singulair '10mg'$  daily and Flovent 15mg 2 puffs twice daily with spacer - Prior to physical activity: albuterol 2 puffs 10-15 minutes before physical activity. - Rescue medications: albuterol  4 puffs every 4-6 hours as needed - Changes during respiratory infections or worsening symptoms: Increase Flovent to 4 puffs twice daily for TWO WEEKS. - Asthma control goals:  * Full participation in all desired activities (may need albuterol before activity) * Albuterol use two time or less a week on average (not counting use with activity) * Cough interfering with sleep two time or less a month * Oral steroids no more than once a year * No hospitalizations  Mixed Allergic and nonallergic Rhinitis: not well  controlled  - Testing today showed postive to  [place] grass pollen and cocklebur weed pollen, intradermals were negative  - Copy of test results provided.  - Avoidance measures provided. - Start taking:  Zyrtec '10mg'$  daily as needed, Flonase 1 spray per nostril as needed for stuffy nose  and Singulair (montelukast) '10mg'$  daily - You can use an extra dose of the antihistamine, if needed, for breakthrough symptoms.  - Consider nasal saline rinses 1-2 times daily to remove allergens from the nasal cavities as well as help with mucous clearance (this is especially helpful to do before the nasal sprays are given) - I an going to get a few labs to check your immune symptom given recurrent sinus infections in the spring   Follow up: 4 weeks   Thank you so much for letting me partake in your care today.  Don't hesitate to reach out if you have any additional concerns!  ERoney Marion MD  [facility] [place] [place]  Reducing Pollen Exposure  The American Academy of Allergy, Asthma and Immunology suggests the following steps to reduce your exposure to pollen during allergy seasons.    Do not hang sheets or clothing out to dry; pollen may collect on these items. Do not mow lawns or spend time around freshly cut grass; mowing stirs up pollen. Keep windows closed at night.  Keep car windows closed while driving. Minimize morning activities outdoors, a time when pollen  counts are usually at their highest. Stay indoors as much as possible when pollen counts or humidity is high and on windy days when pollen tends to remain in the air longer. Use air conditioning when possible.  Many air conditioners have filters that trap the pollen spores. Use a HEPA room air filter to remove pollen form the indoor air you breathe.    Meds ordered this encounter  Medications   gabapentin (NEURONTIN) 600 MG tablet    Sig: Take 1 tablet (600 mg total) by mouth daily.    Dispense:  90 tablet    Refill:  1   fluticasone (FLOVENT HFA) 44 MCG/ACT inhaler    Sig: Inhale 2 puffs into the lungs in the morning and at bedtime.    Dispense:  1 each    Refill:  6   Lab Orders         Immunoglobulins, QN, A/E/G/M         Strep pneumoniae 23 Serotypes IgG         Diphtheria / Tetanus Antibody Panel         Complement, total      Other allergy screening: Asthma: yes Rhino conjunctivitis: yes Food allergy:  burning mouth syndrome Medication allergy: no Hymenoptera allergy: no Urticaria: no Eczema:no History of recurrent infections suggestive of immunodeficency:  maybe   Diagnostics: Spirometry:  Tracings reviewed. His effort: Good reproducible efforts. FVC: 2.96L FEV1: 2.52L, 80% predicted FEV1/FVC ratio: 85% Interpretation: Spirometry consistent with normal pattern.  Please see scanned spirometry results for details.  Skin Testing: Environmental allergy panel and select foods. Positive to [place] blue and cocklebur only, intradermal's negative Results interpreted by myself and discussed with patient/family.  Airborne Adult Perc - 08/25/22 0949     Time Antigen Placed 0945    Allergen Manufacturer Lavella Hammock    Location Back    Number of Test 59    1. Control-Buffer 50% Glycerol Negative    2. Control-Histamine 1 mg/ml 3+    3. Albumin saline Negative    4. Campbell Negative    5. [place] Negative    6. Johnson Negative    7. [place] Blue 3+    8. Meadow Fescue  Negative    9. Perennial Rye Negative    10. Sweet Vernal Negative    11. Timothy Negative    12. Cocklebur 3+    13. Burweed Marshelder Negative    14. Ragweed, short Negative    15. Ragweed, Giant Negative    16. Plantain,  English Negative    17. Lamb's Quarters Negative    18. Sheep Sorrell Negative    19. Rough Pigweed Negative    20. Marsh Elder, Rough Negative    21. Mugwort, Common Negative    22. Ash mix Negative    23. Birch mix Negative    24. Beech American Negative    25. Box, Elder Negative    26. Cedar, red Negative    27. Cottonwood, [place] Negative    28. Elm mix Negative    29. Hickory Negative    30. Maple mix Negative    31. Oak, [place] mix Negative    32. Pecan Pollen Negative    33. Pine mix Negative    34. Sycamore Eastern Negative    35. [place], Black Pollen Negative    36. Alternaria alternata Negative    37. Cladosporium Herbarum Negative    38. Aspergillus mix Negative    39. Penicillium mix Negative    40. Bipolaris sorokiniana (Helminthosporium) Negative    41. Drechslera spicifera (Curvularia) Negative    42. Mucor plumbeus Negative    43. Fusarium moniliforme Negative    44. Aureobasidium pullulans (pullulara) Negative    45. Rhizopus oryzae Negative    46. Botrytis cinera Negative    47. Epicoccum nigrum Negative    48. Phoma betae Negative    49. Candida Albicans Negative    50. Trichophyton mentagrophytes Negative    51. Mite, D Farinae  5,000 AU/ml Negative    52. Mite, D Pteronyssinus  5,000 AU/ml Negative    53. Cat Hair 10,000 BAU/ml Negative    54.  Dog Epithelia Negative    55. Mixed Feathers Negative    56. Horse Epithelia Negative    57. Cockroach, German Negative    58. Mouse Negative    59. Tobacco Leaf Negative             Food Perc - 08/25/22 0950       Test Information   Time Antigen Placed 0945    Allergen Manufacturer Lavella Hammock    Location Back    Number of allergen test 10      Food  1. Peanut  Negative    2. Soybean food Negative    3. Wheat, whole Negative    4. Sesame Negative    5. Milk, cow Negative    6. Egg White, chicken Negative    7. Casein Negative    8. Shellfish mix Negative    9. Fish mix 3+    10. Cashew Negative             Intradermal - 08/25/22 1033     Time Antigen Placed 1033    Allergen Manufacturer Lavella Hammock    Location Back    Number of Test 13    Control Negative    Guatem[place] Negative    Johnson Negative    Ragweed mix Negative    Tree mix Negative    Mold 1 Negative    Mold 2 Negative    Mold 3 Negative    Mold 4 Negative    Cat Negative    Dog Negative    Cockroach Negative    Mite mix Negative             Past Medical History: Patient Active Problem List   Diagnosis Date Noted   B12 deficiency 11/30/2021   Chronic fatigue 11/30/2021   Vertigo 11/30/2021   COVID-19 virus infection 08/24/2021   Chest pain of uncertain etiology    Progressive angina ([place]) 05/21/2021   Unstable angina ([place])    Obesity due to excess calories without serious comorbidity    Seasonal and perennial allergic rhinitis 06/10/2018   Obstructive sleep apnea 11/07/2017   Insomnia 11/07/2017   Dyspnea on exertion 11/07/2017   Multiple gastric ulcers 08/24/2017   Hx of malignant melanoma of skin 08/24/2017   GERD 09/18/2007   NEPHROLITHIASIS, HX OF 09/18/2007   Past Medical History:  Diagnosis Date   Anxiety    situational anxiety   Arthritis    LEFT knee   Asthma    Cancer ([place]), Melanoma    skin x 3-4 times   GERD (gastroesophageal reflux disease)    on meds   Hiatal hernia    Kidney stones    Multiple gastric ulcers 2015   seen on endoscopy per Dr. Lyndel Safe in [place]    Seasonal allergies    Sleep apnea    sees Dr. Keturah Barre, uses CPAP    Past Surgical History: Past Surgical History:  Procedure Laterality Date   COLONOSCOPY  2015   per Dr. Lyndel Safe in [place], benign polyps, repeat in 7 yrs    COLONOSCOPY  2017    RG-F/V-prepopik(good)-hems/TA   HERNIA REPAIR Bilateral    inguinal hernias repaired with mesh   KNEE SURGERY Left    arthritis and meniscal tear   LEFT HEART CATH AND CORONARY ANGIOGRAPHY N/A 05/22/2021   Procedure: LEFT HEART CATH AND CORONARY ANGIOGRAPHY;  Surgeon: Burnell Blanks, MD;  Location: [facility];  Service: Cardiovascular;  Laterality: N/A;   MELANOMA EXCISION     per Dr. Jimmye Norman in [place], 3 removed from both shoulders and left temple    POLYPECTOMY  2017   TA   Medication List:  Current Outpatient Medications  Medication Sig Dispense Refill   APPLE CIDER VINEGAR PO Take 1 tablet by mouth daily at 6 (six) AM. GUMMY     fluticasone (FLOVENT HFA) 44 MCG/ACT inhaler Inhale 2 puffs into the lungs in the morning and at bedtime. 1 each 6   gabapentin (NEURONTIN) 600 MG tablet Take 1 tablet (600 mg total)  by mouth daily. 90 tablet 1   omeprazole (PRILOSEC) 40 MG capsule TAKE ONE CAPSULE (40 MG TOTAL) BY MOUTH DAILY 90 capsule 2   zolpidem (AMBIEN) 10 MG tablet TAKE 1 TABLET BY MOUTH EVERY DAY AT BEDTIME AS NEEDED FOR SLEEP 30 tablet 5   cephALEXin (KEFLEX) 500 MG capsule Take 1 capsule (500 mg total) by mouth 4 (four) times daily. (Patient not taking: Reported on 08/25/2022) 28 capsule 0   cyanocobalamin (,VITAMIN B-12,) 1000 MCG/ML injection GIVE YOURSELF 1000 MG/ML (1 ML ) ONCE A WEEK FOR 12 WEEKS. (Patient not taking: Reported on 08/25/2022) 12 mL 1   Syringe/Needle, Disp, (SYRINGE 3CC/25GX1-1/2") 25G X 1-1/2" 3 ML MISC 1 application by Does not apply route once a week. (Patient not taking: Reported on 08/25/2022) 100 each 0   No current facility-administered medications for this visit.   Allergies: Allergies  Allergen Reactions   Trazodone And Nefazodone Other (See Comments)    Feels hung over the next day    Penicillins Hives, Itching and Rash   Social History: Social History   Socioeconomic History   Marital status: Married    Spouse name: Not on  file   Number of children: Not on file   Years of education: Not on file   Highest education level: Not on file  Occupational History   Occupation: Self-employed, used car lot, Museum/gallery exhibitions officer, Paramedic franchise  Tobacco Use   Smoking status: Former    Years: 3.00    Types: Cigarettes    Quit date: 06/11/1979    Years since quitting: 43.2   Smokeless tobacco: Never   Tobacco comments:    pt reports smoker 0.5 pack/month as a teenager 06/11/2019  Vaping Use   Vaping Use: Never used  Substance and Sexual Activity   Alcohol use: Yes    Alcohol/week: 1.0 - 2.0 standard drink of alcohol    Types: 1 - 2 Cans of beer per week    Comment: 1-2 beers per month   Drug use: Never   Sexual activity: Yes  Other Topics Concern   Not on file  Social History Narrative   Not on file   Social Determinants of Health   Financial Resource Strain: Not on file  Food Insecurity: Not on file  Transportation Needs: Not on file  Physical Activity: Not on file  Stress: Not on file  Social Connections: Not on file   Lives in a single-family home that was built in 1994 recently remodeled.  He has no roaches in the house and bed is 2 feet off the floor.  He has dust mite precautions on bed but not pillows.  He is not exposed to fumes, chemicals or dust.  There is no HEPA filter in the home and home is not near an interstate industrial area. Smoking: No smoking currently, he was a prior smoker quit 40 years ago Occupation: He is retired  Programme researcher, broadcasting/film/video History: Environmental education officer in the house: no Charity fundraiser in the family room: yes Carpet in the bedroom: yes Heating: heat pump Cooling: heat pump Pet: no  Family History: Family History  Problem Relation Age of Onset   Colon polyps Mother    Arthritis Mother    Cancer Mother    Hyperlipidemia Mother    Hypertension Mother    Leukemia Mother    Atrial fibrillation Father    Heart attack Father    Heart disease Father    Hypertension Father     Hypertension Sister  Seizures Sister    Colon cancer Neg Hx    Esophageal cancer Neg Hx    Stomach cancer Neg Hx    Rectal cancer Neg Hx      ROS: All others negative except as noted per HPI.   Objective: BP (!) 130/90 (BP Location: Right Arm, Patient Position: Sitting, Cuff Size: Normal)   Pulse 70   Temp 97.7 F (36.5 C) (Oral)   Resp 20   Ht 5' 7.32" (1.71 m)   Wt 224 lb 11.2 oz (101.9 kg)   SpO2 99%   BMI 34.86 kg/m  Body mass index is 34.86 kg/m.  General Appearance:  Alert, cooperative, no distress, appears stated age  Head:  Normocephalic, without obvious abnormality, atraumatic  Eyes:  Conjunctiva clear, EOM's intact  Nose: Nares normal,  erythematous nasal mucosa, no visible anterior polyps, and septum midline  Throat: Lips, tongue normal; teeth and gums normal, normal posterior oropharynx and no tonsillar exudate  Neck: Supple, symmetrical  Lungs:   clear to auscultation bilaterally, Respirations unlabored, no coughing  Heart:  regular rate and rhythm and no murmur, Appears well perfused  Extremities: No edema  Skin: Skin color, texture, turgor normal,    and no rashes or lesions on visualized portions of skin  Neurologic: No gross deficits   The plan was reviewed with the patient/family, and all questions/concerned were addressed.  It was my pleasure to see Michael Bernard today and participate in his care. Please feel free to contact me with any questions or concerns.  Sincerely,  [name], MD Allergy & Immunology  [facility] office: [phone] [facility] office: [phone]

## 2022-08-25 NOTE — Patient Instructions (Addendum)
Burning Tongue Syndrome  Burning tongue syndrome is usually associated with a neurological process Recommend starting gabapentin '600mg'$  at night, we can titrate up as needed  If no response may consider referral to ENT for tongue biopsy   Mild Intermittent Asthma:  not well controlled  - Breathing test today showed: looked great!!  PLAN:  - Spacer sample and demonstration provided. - Daily controller medication(s): Singulair '10mg'$  daily and Flovent 25mg 2 puffs twice daily with spacer - Prior to physical activity: albuterol 2 puffs 10-15 minutes before physical activity. - Rescue medications: albuterol 4 puffs every 4-6 hours as needed - Changes during respiratory infections or worsening symptoms: Increase Flovent to 4 puffs twice daily for TWO WEEKS. - Asthma control goals:  * Full participation in all desired activities (may need albuterol before activity) * Albuterol use two time or less a week on average (not counting use with activity) * Cough interfering with sleep two time or less a month * Oral steroids no more than once a year * No hospitalizations  Mixed Allergic and nonallergic Rhinitis: not well  controlled  - Testing today showed postive to  [place] grass pollen and cocklebur weed pollen, intradermals were negative  - Copy of test results provided.  - Avoidance measures provided. - Start taking:  Zyrtec '10mg'$  daily as needed, Flonase 1 spray per nostril as needed for stuffy nose  and Singulair (montelukast) '10mg'$  daily - You can use an extra dose of the antihistamine, if needed, for breakthrough symptoms.  - Consider nasal saline rinses 1-2 times daily to remove allergens from the nasal cavities as well as help with mucous clearance (this is especially helpful to do before the nasal sprays are given) - I an going to get a few labs to check your immune symptom given recurrent sinus infections in the spring   Follow up: 4 weeks   Thank you so much for letting me partake  in your care today.  Don't hesitate to reach out if you have any additional concerns!  ERoney Marion MD  [facility] [place] [place]  Reducing Pollen Exposure  The American Academy of Allergy, Asthma and Immunology suggests the following steps to reduce your exposure to pollen during allergy seasons.    Do not hang sheets or clothing out to dry; pollen may collect on these items. Do not mow lawns or spend time around freshly cut grass; mowing stirs up pollen. Keep windows closed at night.  Keep car windows closed while driving. Minimize morning activities outdoors, a time when pollen counts are usually at their highest. Stay indoors as much as possible when pollen counts or humidity is high and on windy days when pollen tends to remain in the air longer. Use air conditioning when possible.  Many air conditioners have filters that trap the pollen spores. Use a HEPA room air filter to remove pollen form the indoor air you breathe.

## 2022-09-01 LAB — STREP PNEUMONIAE 23 SEROTYPES IGG
Pneumo Ab Type 1*: 0.6 ug/mL — ABNORMAL LOW (ref >1.3)
Pneumo Ab Type 12 (12F)*: 0.5 ug/mL — ABNORMAL LOW (ref >1.3)
Pneumo Ab Type 14*: <0.1 ug/mL — ABNORMAL LOW (ref >1.3)
Pneumo Ab Type 17 (17F)*: 1.3 ug/mL — ABNORMAL LOW (ref >1.3)
Pneumo Ab Type 19 (19F)*: 4.7 ug/mL (ref >1.3)
Pneumo Ab Type 2*: 3.2 ug/mL (ref >1.3)
Pneumo Ab Type 20*: 13.9 ug/mL (ref >1.3)
Pneumo Ab Type 22 (22F)*: 2.7 ug/mL (ref >1.3)
Pneumo Ab Type 23 (23F)*: 0.3 ug/mL — ABNORMAL LOW (ref >1.3)
Pneumo Ab Type 26 (6B)*: 0.8 ug/mL — ABNORMAL LOW (ref >1.3)
Pneumo Ab Type 3*: 0.5 ug/mL — ABNORMAL LOW (ref >1.3)
Pneumo Ab Type 34 (10A)*: 2.4 ug/mL (ref >1.3)
Pneumo Ab Type 4*: <0.1 ug/mL — ABNORMAL LOW (ref >1.3)
Pneumo Ab Type 43 (11A)*: 0.4 ug/mL — ABNORMAL LOW (ref >1.3)
Pneumo Ab Type 5*: 0.3 ug/mL — ABNORMAL LOW (ref >1.3)
Pneumo Ab Type 51 (7F)*: 0.2 ug/mL — ABNORMAL LOW (ref >1.3)
Pneumo Ab Type 54 (15B)*: 0.1 ug/mL — ABNORMAL LOW (ref >1.3)
Pneumo Ab Type 56 (18C)*: 0.5 ug/mL — ABNORMAL LOW (ref >1.3)
Pneumo Ab Type 57 (19A)*: 3.2 ug/mL (ref >1.3)
Pneumo Ab Type 68 (9V)*: 1.4 ug/mL (ref >1.3)
Pneumo Ab Type 70 (33F)*: 3.1 ug/mL (ref >1.3)
Pneumo Ab Type 8*: 1.6 ug/mL (ref >1.3)
Pneumo Ab Type 9 (9N)*: 0.2 ug/mL — ABNORMAL LOW (ref >1.3)

## 2022-09-01 LAB — DIPHTHERIA / TETANUS ANTIBODY PANEL
Diphtheria Ab: 1.38 IU/mL (ref <0.10)
Tetanus Ab, IgG: 3.61 IU/mL (ref <0.10)

## 2022-09-01 LAB — IMMUNOGLOBULINS A/E/G/M, SERUM
IgA/Immunoglobulin A, Serum: 370 mg/dL (ref 61–437)
IgE (Immunoglobulin E), Serum: 15 IU/mL (ref 6–495)
IgG (Immunoglobin G), Serum: 1500 mg/dL (ref 603–1613)
IgM (Immunoglobulin M), Srm: 128 mg/dL (ref 20–172)

## 2022-09-01 LAB — COMPLEMENT, TOTAL: Compl, Total (CH50): >60 U/mL (ref >41)

## 2022-09-06 NOTE — Progress Notes (Signed)
Blood work returned with low levels of protective to strep pneumoniae.  This can contribute to recurrent upper respiratory infections. We need Michael Bernard to get the pneumovax vaccine and repeat the levels in 6 weeks.  He can get this through his primary care or at our [facility] office.  Let us know when he does and we can put the labs in.  Can someone let patient know?  Thanks!

## 2022-09-13 ENCOUNTER — Ambulatory Visit (INDEPENDENT_AMBULATORY_CARE_PROVIDER_SITE_OTHER): Payer: Medicare HMO

## 2022-09-13 DIAGNOSIS — B999 Unspecified infectious disease: Secondary | ICD-10-CM | POA: Diagnosis not present

## 2022-09-13 DIAGNOSIS — Z23 Encounter for immunization: Secondary | ICD-10-CM

## 2022-09-13 NOTE — Progress Notes (Signed)
Immunotherapy   Patient Details  Name: Michael Bernard MRN: [id number] Date of Birth: March 05, 1956  09/13/2022  Michael Bernard here to get Pneumococcal vaccine. [address] # [id number] Lot # [id number] Exp 08/29/2023 Consent signed  Michael Bernard 09/13/2022, 11:04 AM

## 2022-09-22 ENCOUNTER — Ambulatory Visit: Payer: Medicare HMO | Admitting: Internal Medicine

## 2022-09-22 ENCOUNTER — Encounter: Payer: Self-pay | Admitting: Internal Medicine

## 2022-09-22 VITALS — BP 124/70 | HR 96 | Temp 98.3°F | Resp 18 | Wt 229.5 lb

## 2022-09-22 DIAGNOSIS — B999 Unspecified infectious disease: Secondary | ICD-10-CM

## 2022-09-22 DIAGNOSIS — J3089 Other allergic rhinitis: Secondary | ICD-10-CM

## 2022-09-22 DIAGNOSIS — J4520 Mild intermittent asthma, uncomplicated: Secondary | ICD-10-CM

## 2022-09-22 DIAGNOSIS — K146 Glossodynia: Secondary | ICD-10-CM

## 2022-09-22 NOTE — Progress Notes (Signed)
Follow Up Note  RE: Michael Bernard MRN: [id number] DOB: 13-May-1956 Date of Office Visit: 09/22/2022  Referring provider: [name], MD Primary care provider: [name], MD  Chief Complaint: Follow-up (Tongue feels on fire)  History of Present Illness: I had the pleasure of seeing [name] for a follow up visit at the [facility] [place] on 09/22/2022. He is a 66 y.o. male, who is being followed for intermittent asthma, recurrent infections, allergic rhinitis, burning tongue syndrome. His previous allergy office visit was on 08/25/2022 with [name]. Today is a regular follow up visit.  History obtained from patient, chart review.  Burning tongue syndrome He was started on gabapentin 600 mg at night at last visit.  He reports although this helped him sleep better it has not improved his symptoms.  He is open to ENT referral.  He also associates symptoms with decreasing sense of taste Past Hx: GI ruled out LPR.  Denies any foods that make it worth, but he does avoid spicy foods.   He was diagnosed with B12 deficiency which has been treated, without response to symptoms.   ASTHMA - Medical therapy: At last visit he was prescribed Flovent 44 mcg 2 puffs twice daily.  Due to cost of inhaler he has not started it.  He is open to using it as needed as he did not pick it up.  Is also on Singulair 10 mg daily - Rescue inhaler use: Denies any use since last visit - Symptoms: Denies any cough, wheeze, dyspnea - Exacerbation history: 0 ABX for respiratory illness since last visit, 1 OCS, 0ED, 0 UC visits in the past year  - ACT: 25 /25 - Adverse effects of medication: denies - Previous FEV1: 2.52 L, 80% - Biologic Labs not done  Rhinitis Currently on Zyrtec 10 mg daily, Flonase 1 spray per nostril daily, Singulair 10 mg daily.  Reports good control of his rhinitis symptoms.  Denies any adverse effects of medications. Previous Hx: 08/25/2022 Skin test positive to  [facility], cockle[place] weed pollen, with negative intradermal's   Recurrent Sinus infections  Denies any infections since last visit.  He did receive the Pneumovax vaccine on 08/30/22 Previous history: Labs within normal complement, diphtheria tetanus titers, quantitative immunoglobulins.  Strep pneumo titers protective for 9 out of 23 serotypes.   Assessment and Plan: [name] is a 66 y.o. male with: Burning tongue - Plan: Ambulatory referral to ENT  Mild intermittent asthma in adult without complication  Other allergic rhinitis  Recurrent infections - Plan: Strep pneumoniae 23 Serotypes IgG Plan: Patient Instructions  Burning Tongue Syndrome  Burning tongue syndrome is usually associated with a neurological process We will refer you to ENT for further evaluation.    Mild Intermittent Asthma:  well controlled   PLAN:  - Spacer sample and demonstration provided. - Daily controller medication(s): Singulair '10mg'$  daily - Prior to physical activity: albuterol 2 puffs 10-15 minutes before physical activity. - Rescue medications: albuterol 4 puffs every 4-6 hours as needed - Changes during respiratory infections or worsening symptoms: Add on Flovent to 2 puffs twice daily for TWO WEEKS. - Asthma control goals:  * Full participation in all desired activities (may need albuterol before activity) * Albuterol use two time or less a week on average (not counting use with activity) * Cough interfering with sleep two time or less a month * Oral steroids no more than once a year * No hospitalizations  Mixed Allergic  and nonallergic Rhinitis:  - Tsting 08/25/22: postive to  [place] blue grass pollen and cockle[place] weed pollen, intradermals were negative  -Continue avoidance measures - Continue taking:  Zyrtec '10mg'$  daily as needed, Flonase 1 spray per nostril as needed for stuffy nose  and Singulair (montelukast) '10mg'$  daily - You can use an extra dose of the antihistamine, if needed, for  breakthrough symptoms.  - Consider nasal saline rinses 1-2 times daily to remove allergens from the nasal cavities as well as help with mucous clearance (this is especially helpful to do before the nasal sprays are given)    Recurrent Sinus Infections  - Immune system evaluation: Strep pneumococcal titers were low, you received a Pneumovax booster on 08/30/2022 -Get repeat strep pneumococcal titers sometime after 09/30/2022  Follow up: We will call you with lab results otherwise follow-up in clinic in 3 months  Thank you so much for letting me partake in your care today.  Don't hesitate to reach out if you have any additional concerns!  [name], MD  [facility] [place] [place]   No follow-ups on file.  No orders of the defined types were placed in this encounter.   Lab Orders         Strep pneumoniae 23 Serotypes IgG     Diagnostics: None done    Medication List:  Current Outpatient Medications  Medication Sig Dispense Refill   APPLE CIDER VINEGAR PO Take 1 tablet by mouth daily at 6 (six) AM. GUMMY     fluticasone (FLOVENT HFA) 44 MCG/ACT inhaler Inhale 2 puffs into the lungs in the morning and at bedtime. 1 each 6   gabapentin (NEURONTIN) 600 MG tablet Take 1 tablet (600 mg total) by mouth daily. 90 tablet 1   omeprazole (PRILOSEC) 40 MG capsule TAKE ONE CAPSULE (40 MG TOTAL) BY MOUTH DAILY 90 capsule 2   zolpidem (AMBIEN) 10 MG tablet TAKE 1 TABLET BY MOUTH EVERY DAY AT BEDTIME AS NEEDED FOR SLEEP 30 tablet 5   cephALEXin (KEFLEX) 500 MG capsule Take 1 capsule (500 mg total) by mouth 4 (four) times daily. (Patient not taking: Reported on 08/25/2022) 28 capsule 0   cyanocobalamin (,VITAMIN B-12,) 1000 MCG/ML injection GIVE YOURSELF 1000 MG/ML (1 ML ) ONCE A WEEK FOR 12 WEEKS. (Patient not taking: Reported on 08/25/2022) 12 mL 1   Syringe/Needle, Disp, (SYRINGE 3CC/25GX1-1/2") 25G X 1-1/2" 3 ML MISC 1 application by Does not apply route once a week. (Patient  not taking: Reported on 08/25/2022) 100 each 0   No current facility-administered medications for this visit.   Allergies: Allergies  Allergen Reactions   Trazodone And Nefazodone Other (See Comments)    Feels hung over the next day    Penicillins Hives, Itching and Rash   I reviewed his past medical history, social history, family history, and environmental history and no significant changes have been reported from his previous visit.  ROS: All others negative except as noted per HPI.   Objective: BP 124/70 (BP Location: Left Arm, Patient Position: Sitting, Cuff Size: Normal)   Pulse 96   Temp 98.3 F (36.8 C) (Temporal)   Resp 18   Wt 229 lb 8 oz (104.1 kg)   SpO2 96%   BMI 35.60 kg/m  Body mass index is 35.6 kg/m. General Appearance:  Alert, cooperative, no distress, appears stated age  Head:  Normocephalic, without obvious abnormality, atraumatic  Eyes:  Conjunctiva clear, EOM's intact  Nose: Nares normal, hypertrophic turbinates, no  visible anterior polyps, and septum midline  Throat: Lips, tongue normal; teeth and gums normal, normal posterior oropharynx  Neck: Supple, symmetrical  Lungs:   clear to auscultation bilaterally, Respirations unlabored, no coughing  Heart:  regular rate and rhythm and no murmur, Appears well perfused  Extremities: No edema  Skin: Skin color, texture, turgor normal, no rashes or lesions on visualized portions of skin   Neurologic: No gross deficits   Previous notes and tests were reviewed. The plan was reviewed with the patient/family, and all questions/concerned were addressed.  It was my pleasure to see Anthoni today and participate in his care. Please feel free to contact me with any questions or concerns.  Sincerely,  [name], MD  Allergy & Immunology  [facility] [facility] Office: [phone]

## 2022-09-22 NOTE — Patient Instructions (Addendum)
Burning Tongue Syndrome  Burning tongue syndrome is usually associated with a neurological process We will refer you to ENT for further evaluation.    Mild Intermittent Asthma:  well controlled   PLAN:  - Spacer sample and demonstration provided. - Daily controller medication(s): Singulair '10mg'$  daily - Prior to physical activity: albuterol 2 puffs 10-15 minutes before physical activity. - Rescue medications: albuterol 4 puffs every 4-6 hours as needed - Changes during respiratory infections or worsening symptoms: Add on Flovent to 2 puffs twice daily for TWO WEEKS. - Asthma control goals:  * Full participation in all desired activities (may need albuterol before activity) * Albuterol use two time or less a week on average (not counting use with activity) * Cough interfering with sleep two time or less a month * Oral steroids no more than once a year * No hospitalizations  Mixed Allergic and nonallergic Rhinitis:  - Tsting 08/25/22: postive to  [place] blue grass pollen and cockle[place] weed pollen, intradermals were negative  -Continue avoidance measures - Continue taking:  Zyrtec '10mg'$  daily as needed, Flonase 1 spray per nostril as needed for stuffy nose  and Singulair (montelukast) '10mg'$  daily - You can use an extra dose of the antihistamine, if needed, for breakthrough symptoms.  - Consider nasal saline rinses 1-2 times daily to remove allergens from the nasal cavities as well as help with mucous clearance (this is especially helpful to do before the nasal sprays are given)    Recurrent Sinus Infections  - Immune system evaluation: Strep pneumococcal titers were low, you received a Pneumovax booster on 08/30/2022 -Get repeat strep pneumococcal titers sometime after 09/30/2022  Follow up: We will call you with lab results otherwise follow-up in clinic in 3 months  Thank you so much for letting me partake in your care today.  Don't hesitate to reach out if you have any additional  concerns!  [name], MD  [facility] [place], [place]

## 2022-10-04 DIAGNOSIS — B999 Unspecified infectious disease: Secondary | ICD-10-CM | POA: Diagnosis not present

## 2022-10-06 ENCOUNTER — Telehealth: Payer: Self-pay

## 2022-10-06 DIAGNOSIS — L821 Other seborrheic keratosis: Secondary | ICD-10-CM | POA: Diagnosis not present

## 2022-10-06 DIAGNOSIS — Z85820 Personal history of malignant melanoma of skin: Secondary | ICD-10-CM | POA: Diagnosis not present

## 2022-10-06 DIAGNOSIS — L570 Actinic keratosis: Secondary | ICD-10-CM | POA: Diagnosis not present

## 2022-10-06 DIAGNOSIS — D225 Melanocytic nevi of trunk: Secondary | ICD-10-CM | POA: Diagnosis not present

## 2022-10-06 DIAGNOSIS — D2239 Melanocytic nevi of other parts of face: Secondary | ICD-10-CM | POA: Diagnosis not present

## 2022-10-06 NOTE — Telephone Encounter (Signed)
-----   Message from [name], MD sent at 09/22/2022 11:04 AM EDT ----- Please help facilitate a referral to ENT in [place] for burning tongue syndrome.  Thanks!

## 2022-10-09 LAB — STREP PNEUMONIAE 23 SEROTYPES IGG
Pneumo Ab Type 1*: <0.1 ug/mL — ABNORMAL LOW (ref >1.3)
Pneumo Ab Type 12 (12F)*: <0.1 ug/mL — ABNORMAL LOW (ref >1.3)
Pneumo Ab Type 14*: 0.4 ug/mL — ABNORMAL LOW (ref >1.3)
Pneumo Ab Type 17 (17F)*: <0.1 ug/mL — ABNORMAL LOW (ref >1.3)
Pneumo Ab Type 19 (19F)*: 0.5 ug/mL — ABNORMAL LOW (ref >1.3)
Pneumo Ab Type 2*: 2.1 ug/mL (ref >1.3)
Pneumo Ab Type 20*: <0.1 ug/mL — ABNORMAL LOW (ref >1.3)
Pneumo Ab Type 22 (22F)*: 0.5 ug/mL — ABNORMAL LOW (ref >1.3)
Pneumo Ab Type 23 (23F)*: <0.1 ug/mL — ABNORMAL LOW (ref >1.3)
Pneumo Ab Type 26 (6B)*: <0.1 ug/mL — ABNORMAL LOW (ref >1.3)
Pneumo Ab Type 3*: <0.1 ug/mL — ABNORMAL LOW (ref >1.3)
Pneumo Ab Type 34 (10A)*: <0.1 ug/mL — ABNORMAL LOW (ref >1.3)
Pneumo Ab Type 4*: <0.1 ug/mL — ABNORMAL LOW (ref >1.3)
Pneumo Ab Type 43 (11A)*: 0.1 ug/mL — ABNORMAL LOW (ref >1.3)
Pneumo Ab Type 5*: <0.1 ug/mL — ABNORMAL LOW (ref >1.3)
Pneumo Ab Type 51 (7F)*: <0.1 ug/mL — ABNORMAL LOW (ref >1.3)
Pneumo Ab Type 54 (15B)*: <0.1 ug/mL — ABNORMAL LOW (ref >1.3)
Pneumo Ab Type 56 (18C)*: 0.2 ug/mL — ABNORMAL LOW (ref >1.3)
Pneumo Ab Type 57 (19A)*: 4.9 ug/mL (ref >1.3)
Pneumo Ab Type 68 (9V)*: 0.9 ug/mL — ABNORMAL LOW (ref >1.3)
Pneumo Ab Type 70 (33F)*: 0.2 ug/mL — ABNORMAL LOW (ref >1.3)
Pneumo Ab Type 8*: <0.1 ug/mL — ABNORMAL LOW (ref >1.3)
Pneumo Ab Type 9 (9N)*: <0.1 ug/mL — ABNORMAL LOW (ref >1.3)

## 2022-10-11 NOTE — Progress Notes (Signed)
Repeat vaccine titers were low.  This is diagnostic of specific antibody deficiency.  We need to get a CT scan of your chest to evaluate what the next steps are in treatment.  From now on, with any symptoms suggestive of respiratory or sinus infection you need early use of antibiotics.  Please contact us first for these symptoms for management.  Can someone contact patient and let him know?  Thanks! EML

## 2022-10-19 DIAGNOSIS — G4700 Insomnia, unspecified: Secondary | ICD-10-CM | POA: Diagnosis not present

## 2022-10-19 DIAGNOSIS — G4733 Obstructive sleep apnea (adult) (pediatric): Secondary | ICD-10-CM | POA: Diagnosis not present

## 2022-10-19 DIAGNOSIS — K219 Gastro-esophageal reflux disease without esophagitis: Secondary | ICD-10-CM | POA: Diagnosis not present

## 2022-10-19 DIAGNOSIS — Z6835 Body mass index (BMI) 35.0-35.9, adult: Secondary | ICD-10-CM | POA: Diagnosis not present

## 2022-10-19 DIAGNOSIS — Z87891 Personal history of nicotine dependence: Secondary | ICD-10-CM | POA: Diagnosis not present

## 2022-10-19 DIAGNOSIS — K146 Glossodynia: Secondary | ICD-10-CM | POA: Diagnosis not present

## 2022-10-19 DIAGNOSIS — Z85828 Personal history of other malignant neoplasm of skin: Secondary | ICD-10-CM | POA: Diagnosis not present

## 2022-10-19 DIAGNOSIS — M1990 Unspecified osteoarthritis, unspecified site: Secondary | ICD-10-CM | POA: Diagnosis not present

## 2022-10-22 DIAGNOSIS — H6121 Impacted cerumen, right ear: Secondary | ICD-10-CM | POA: Diagnosis not present

## 2022-10-22 DIAGNOSIS — J309 Allergic rhinitis, unspecified: Secondary | ICD-10-CM | POA: Diagnosis not present

## 2022-10-22 DIAGNOSIS — R2990 Unspecified symptoms and signs involving the nervous system: Secondary | ICD-10-CM | POA: Diagnosis not present

## 2022-10-22 DIAGNOSIS — K146 Glossodynia: Secondary | ICD-10-CM | POA: Diagnosis not present

## 2022-10-22 DIAGNOSIS — J45909 Unspecified asthma, uncomplicated: Secondary | ICD-10-CM | POA: Diagnosis not present

## 2022-10-27 ENCOUNTER — Encounter: Payer: Self-pay | Admitting: Internal Medicine

## 2022-10-27 ENCOUNTER — Ambulatory Visit: Payer: Medicare HMO | Admitting: Internal Medicine

## 2022-10-27 DIAGNOSIS — D806 Antibody deficiency with near-normal immunoglobulins or with hyperimmunoglobulinemia: Secondary | ICD-10-CM | POA: Diagnosis not present

## 2022-10-27 DIAGNOSIS — K146 Glossodynia: Secondary | ICD-10-CM | POA: Diagnosis not present

## 2022-10-27 DIAGNOSIS — J3089 Other allergic rhinitis: Secondary | ICD-10-CM

## 2022-10-27 DIAGNOSIS — J4520 Mild intermittent asthma, uncomplicated: Secondary | ICD-10-CM | POA: Diagnosis not present

## 2022-10-27 NOTE — Progress Notes (Deleted)
Follow Up Note  RE: Michael Bernard MRN: [id number] DOB: 11-Apr-1956 Date of Office Visit: 10/27/2022  Referring provider: [name], MD Primary care provider: [name], MD  Chief Complaint: Allergies  History of Present Illness: I had the pleasure of seeing Michael Bernard for a follow up visit at the [facility] [place] on 10/27/2022. He is a 66 y.o. male, who is being followed for ***. His previous allergy office visit was on *** with {Blank single:19197::"Dr. Dennis","Dr. Earlean Polka. Kim","Dr. Kozlow","Dr. Bardelas","Dr. Bobbitt","Dr. Gallagher","Dr. Rowan Blase, FNP","Christine Quita Skye FNP"}. Today is a {Blank single:19197::"regular follow up visit","new complaint visit of ***","skin testing and follow up visit"}.  History obtained from patient, chart review and {Blank single:19197::"mother","father","interpreter"}.  ***  Assessment and Plan: Michael Bernard is a 66 y.o. male with: No diagnosis found. Plan: Patient Instructions  Burning Tongue Syndrome  Burning tongue syndrome is usually associated with a neurological process We will refer you to ENT for further evaluation.    Mild Intermittent Asthma:  well controlled   PLAN:  - Spacer sample and demonstration provided. - Daily controller medication(s): Singulair '10mg'$  daily - Prior to physical activity: albuterol 2 puffs 10-15 minutes before physical activity. - Rescue medications: albuterol 4 puffs every 4-6 hours as needed - Changes during respiratory infections or worsening symptoms: Add on Flovent to 2 puffs twice daily for TWO WEEKS. - Asthma control goals:  * Full participation in all desired activities (may need albuterol before activity) * Albuterol use two time or less a week on average (not counting use with activity) * Cough interfering with sleep two time or less a month * Oral steroids no more than once a year * No hospitalizations  Mixed Allergic and nonallergic Rhinitis:  - Tsting  08/25/22: postive to  [place] blue grass pollen and cockle[place] weed pollen, intradermals were negative  -Continue avoidance measures - Continue taking:  Zyrtec '10mg'$  daily as needed, Flonase 1 spray per nostril as needed for stuffy nose  and Singulair (montelukast) '10mg'$  daily - You can use an extra dose of the antihistamine, if needed, for breakthrough symptoms.  - Consider nasal saline rinses 1-2 times daily to remove allergens from the nasal cavities as well as help with mucous clearance (this is especially helpful to do before the nasal sprays are given)    Recurrent Sinus Infections  - Immune system evaluation: Strep pneumococcal titers were low, you received a Pneumovax booster on 08/30/2022 -Get repeat strep pneumococcal titers sometime after 09/30/2022  Follow up: We will call you with lab results otherwise follow-up in clinic in 3 months  Thank you so much for letting me partake in your care today.  Don't hesitate to reach out if you have any additional concerns!  [name], MD  [facility] [place] [place]  No follow-ups on file.  No orders of the defined types were placed in this encounter.   Lab Orders  No laboratory test(s) ordered today   Diagnostics: Spirometry:  Tracings reviewed. His effort: {Blank single:19197::"Good reproducible efforts.","It was hard to get consistent efforts and there is a question as to whether this reflects a maximal maneuver.","Poor effort, data can not be interpreted."} FVC: ***L FEV1: ***L, ***% predicted FEV1/FVC ratio: ***% Interpretation: {Blank single:19197::"Spirometry consistent with mild obstructive disease","Spirometry consistent with moderate obstructive disease","Spirometry consistent with severe obstructive disease","Spirometry consistent with possible restrictive disease","Spirometry consistent with mixed obstructive and restrictive disease","Spirometry uninterpretable due to technique","Spirometry consistent with  normal pattern","No overt abnormalities noted given  today's efforts"}.  Please see scanned spirometry results for details.  Skin Testing: {Blank single:19197::"Select foods","Environmental allergy panel","Environmental allergy panel and select foods","Food allergy panel","None","Deferred due to recent antihistamines use"}. *** Results interpreted by myself during this encounter and discussed with patient/family.   Medication List:  Current Outpatient Medications  Medication Sig Dispense Refill   APPLE CIDER VINEGAR PO Take 1 tablet by mouth daily at 6 (six) AM. GUMMY     fluticasone (FLOVENT HFA) 44 MCG/ACT inhaler Inhale 2 puffs into the lungs in the morning and at bedtime. 1 each 6   gabapentin (NEURONTIN) 600 MG tablet Take 1 tablet (600 mg total) by mouth daily. 90 tablet 1   MECLIZINE HCL PO Take by mouth.     omeprazole (PRILOSEC) 40 MG capsule TAKE ONE CAPSULE (40 MG TOTAL) BY MOUTH DAILY 90 capsule 2   zolpidem (AMBIEN) 10 MG tablet TAKE 1 TABLET BY MOUTH EVERY DAY AT BEDTIME AS NEEDED FOR SLEEP 30 tablet 5   No current facility-administered medications for this visit.   Allergies: Allergies  Allergen Reactions   Trazodone And Nefazodone Other (See Comments)    Feels hung over the next day    Penicillins Hives, Itching and Rash   I reviewed his past medical history, social history, family history, and environmental history and no significant changes have been reported from his previous visit.  ROS: All others negative except as noted per HPI.   Objective: There were no vitals taken for this visit. There is no height or weight on file to calculate BMI. General Appearance:  Alert, cooperative, no distress, appears stated age  Head:  Normocephalic, without obvious abnormality, atraumatic  Eyes:  Conjunctiva clear, EOM's intact  Nose: Nares normal, {Blank multiple:19196:a:"***","hypertrophic turbinates","normal mucosa","no visible anterior polyps","septum midline"}  Throat:  Lips, tongue normal; teeth and gums normal, {Blank multiple:19196:a:"***","normal posterior oropharynx","tonsils 2+","tonsils 3+","no tonsillar exudate","+ cobblestoning"}  Neck: Supple, symmetrical  Lungs:   {Blank multiple:19196:a:"***","clear to auscultation bilaterally","end-expiratory wheezing","wheezing throughout"}, Respirations unlabored, {Blank multiple:19196:a:"***","no coughing","intermittent dry coughing"}  Heart:  {Blank multiple:19196:a:"***","regular rate and rhythm","no murmur"}, Appears well perfused  Extremities: No edema  Skin: Skin color, texture, turgor normal, no rashes or lesions on visualized portions of skin {Blank multiple:19196:a:""***"}  Neurologic: No gross deficits   Previous notes and tests were reviewed. The plan was reviewed with the patient/family, and all questions/concerned were addressed.  It was my pleasure to see Michael Bernard today and participate in his care. Please feel free to contact me with any questions or concerns.  Sincerely,  [name], MD  Allergy & Immunology  [facility] [place] [facility] Office: [phone]

## 2022-10-27 NOTE — Patient Instructions (Addendum)
Burning Tongue Syndrome  Given lack of response to gabapentin, negative food testing, no concern from ENT recommend following up again with dentist and go from there.  Mild Intermittent Asthma:  well controlled   PLAN:  - Spacer sample and demonstration provided. - Daily controller medication(s): Singulair '10mg'$  daily - Prior to physical activity: albuterol 2 puffs 10-15 minutes before physical activity. - Rescue medications: albuterol 4 puffs every 4-6 hours as needed - Changes during respiratory infections or worsening symptoms: Add on Flovent to 2 puffs twice daily for TWO WEEKS. - Asthma control goals:  * Full participation in all desired activities (may need albuterol before activity) * Albuterol use two time or less a week on average (not counting use with activity) * Cough interfering with sleep two time or less a month * Oral steroids no more than once a year * No hospitalizations  Mixed Allergic and nonallergic Rhinitis: Well-controlled - Testing 08/25/22: postive to  [place] blue grass pollen and cockle[place] weed pollen, intradermals were negative  -Continue avoidance measures - Continue taking:  Zyrtec '10mg'$  daily as needed, Flonase 1 spray per nostril as needed for stuffy nose  and Singulair (montelukast) '10mg'$  daily - You can use an extra dose of the antihistamine, if needed, for breakthrough symptoms.  - Consider nasal saline rinses 1-2 times daily to remove allergens from the nasal cavities as well as help with mucous clearance (this is especially helpful to do before the nasal sprays are given)   Specific Antibody Deficiency  -At today's visit patient was counseled on specific antibody deficiency diagnosis. -For any increased URI symptoms he should contact our clinic for early use of antibiotics -If this fails we could consider prophylactic antibiotics or supplemental immunoglobulin as needed.   Follow up: follow-up in clinic in 3 months  Thank you so much for letting me  partake in your care today.  Don't hesitate to reach out if you have any additional concerns!  [name], MD  [facility] [place], [place]

## 2022-11-01 NOTE — Progress Notes (Signed)
RE: Michael Bernard MRN: [id number] DOB: 1956-03-10 Date of Telemedicine Visit: 10/27/2022  Referring provider: [name], MD Primary care provider: [name], MD  Chief Complaint: Allergies   Telemedicine Follow Up Visit via Telephone: I connected with [name] for a follow up on 11/01/22 by telephone and verified that I am speaking with the correct person using two identifiers.   I discussed the limitations, risks, security and privacy concerns of performing an evaluation and management service by telephone and the availability of in person appointments. I also discussed with the patient that there may be a patient responsible charge related to this service. The patient expressed understanding and agreed to proceed.  Patient is at at home accompanied  Provider is at the office.  Visit start time: 3:05 PM Visit end time: 3:25 PM Insurance consent/check in by: Sherri Medical consent and medical assistant/nurse: Marie  History of Present Illness:  I had the pleasure of seeing Michael Bernard for a follow up visit at the [facility] on 11/01/2022. He is a 66 y.o. male, who is being followed for burning tongue syndrome, intermittent asthma, mixed rhinitis, specific antibody deficiency. His previous allergy office visit was on 09/22/2022 with [name]. Today is a regular follow up visit.  History obtained from patient, chart review.  Since last visit he was referred to ENT, he reports being seen without a clear diagnosis.  They did not offer any treatment or biopsy.  His repeat strep pneumococcal titers came back worse with 2/23 protective despite repeat vaccination.  Today he reports no recurrent infections since last visit.  For his asthma he continues to take Singulair 10 mg daily.  He has not needed any albuterol since last visit.  He does have Flovent at home for asthma and plan.  Rhinitis is moderately well-controlled with Zyrtec 10 mg daily,  Flonase 1 spray per nostril as needed (needing a few days a week), montelukast 10 mg daily.  He continues to have pain and burning sensation on tongue.  At this point he is willing to follow-up again with dentist to see if there is any other treatment options.  He has not seen neurology yet  Otherwise, there have been no changes to his past medical history, surgical history, family history, or social history.  Assessment and Plan:  [name] is a 66 y.o. male with:  Specific antibody deficiency with normal IG concentration and normal number of B cells ([place]) - Plan: CT CHEST WO CONTRAST  Burning tongue syndrome  Mild intermittent asthma in adult without complication  Other allergic rhinitis  Patient Instructions  Burning Tongue Syndrome  Given lack of response to gabapentin, negative food testing, no concern from ENT recommend following up again with dentist and go from there.  Mild Intermittent Asthma:  well controlled   PLAN:  - Spacer sample and demonstration provided. - Daily controller medication(s): Singulair '10mg'$  daily - Prior to physical activity: albuterol 2 puffs 10-15 minutes before physical activity. - Rescue medications: albuterol 4 puffs every 4-6 hours as needed - Changes during respiratory infections or worsening symptoms: Add on Flovent to 2 puffs twice daily for TWO WEEKS. - Asthma control goals:  * Full participation in all desired activities (may need albuterol before activity) * Albuterol use two time or less a week on average (not counting use with activity) * Cough interfering with sleep two time or less a month * Oral steroids no more than once a year *  No hospitalizations  Mixed Allergic and nonallergic Rhinitis: Well-controlled - Testing 08/25/22: postive to  [place] blue grass pollen and cocklebur weed pollen, intradermals were negative  -Continue avoidance measures - Continue taking:  Zyrtec '10mg'$  daily as needed, Flonase 1 spray per nostril as needed for  stuffy nose  and Singulair (montelukast) '10mg'$  daily - You can use an extra dose of the antihistamine, if needed, for breakthrough symptoms.  - Consider nasal saline rinses 1-2 times daily to remove allergens from the nasal cavities as well as help with mucous clearance (this is especially helpful to do before the nasal sprays are given)   Specific Antibody Deficiency  -At today's visit patient was counseled on specific antibody deficiency diagnosis. -For any increased URI symptoms he should contact our clinic for early use of antibiotics -If this fails we could consider prophylactic antibiotics or supplemental immunoglobulin as needed.   Follow up: follow-up in clinic in 3 months  Thank you so much for letting me partake in your care today.  Don't hesitate to reach out if you have any additional concerns!  [name], MD  [facility] [place] [place]   Diagnostics: None.  Medication List:  Current Outpatient Medications  Medication Sig Dispense Refill   APPLE CIDER VINEGAR PO Take 1 tablet by mouth daily at 6 (six) AM. GUMMY     fluticasone (FLOVENT HFA) 44 MCG/ACT inhaler Inhale 2 puffs into the lungs in the morning and at bedtime. 1 each 6   gabapentin (NEURONTIN) 600 MG tablet Take 1 tablet (600 mg total) by mouth daily. 90 tablet 1   MECLIZINE HCL PO Take by mouth.     omeprazole (PRILOSEC) 40 MG capsule TAKE ONE CAPSULE (40 MG TOTAL) BY MOUTH DAILY 90 capsule 2   zolpidem (AMBIEN) 10 MG tablet TAKE 1 TABLET BY MOUTH EVERY DAY AT BEDTIME AS NEEDED FOR SLEEP 30 tablet 5   No current facility-administered medications for this visit.   Allergies: Allergies  Allergen Reactions   Trazodone And Nefazodone Other (See Comments)    Feels hung over the next day    Penicillins Hives, Itching and Rash   I reviewed his past medical history, social history, family history, and environmental history and no significant changes have been reported from previous  visits.  Review of Systems  All other systems reviewed and are negative.   Objective:  Physical exam not obtained as encounter was done via telephone.   Previous notes and tests were reviewed.  I discussed the assessment and treatment plan with the patient. The patient was provided an opportunity to ask questions and all were answered. The patient agreed with the plan and demonstrated an understanding of the instructions.   The patient was advised to call back or seek an in-person evaluation if the symptoms worsen or if the condition fails to improve as anticipated.  I provided 10 minutes of non-face-to-face time during this encounter.  It was my pleasure to participate in [place] Coppola's care today. Please feel free to contact me with any questions or concerns.   Sincerely,  [name], MD

## 2022-11-04 NOTE — Telephone Encounter (Signed)
Looks like Patient seen ENT on 10/22/2022 and seen Dr Simona Huh to follow up regarding the message below on 10/27/2022. Closing this message.

## 2022-11-10 ENCOUNTER — Ambulatory Visit: Payer: Medicare HMO | Admitting: Internal Medicine

## 2022-11-10 VITALS — BP 130/80 | HR 80 | Temp 98.2°F | Resp 19 | Ht 65.0 in | Wt 233.0 lb

## 2022-11-10 DIAGNOSIS — J3089 Other allergic rhinitis: Secondary | ICD-10-CM | POA: Diagnosis not present

## 2022-11-10 DIAGNOSIS — J4530 Mild persistent asthma, uncomplicated: Secondary | ICD-10-CM

## 2022-11-10 DIAGNOSIS — J0181 Other acute recurrent sinusitis: Secondary | ICD-10-CM

## 2022-11-10 DIAGNOSIS — D806 Antibody deficiency with near-normal immunoglobulins or with hyperimmunoglobulinemia: Secondary | ICD-10-CM

## 2022-11-10 DIAGNOSIS — K146 Glossodynia: Secondary | ICD-10-CM | POA: Diagnosis not present

## 2022-11-10 MED ORDER — DOXYCYCLINE MONOHYDRATE 100 MG PO TABS: 100.0000 mg | ORAL_TABLET | Freq: Two times a day (BID) | ORAL | 0 refills | Status: AC

## 2022-11-10 NOTE — Patient Instructions (Addendum)
Specific Antibody Deficiency with acute sinus infection  Start Doxycycline '100mg'$  twice daily for 7 days   Mild Intermittent Asthma:  with acute exacerbation  Breathing tests showed inflammation in your lungs that was partially reversible  Prednisone '10mg'$  tablet pack: 2 tablets given in office today. Take 2 more tablets before bed today.  Then take 2 tablets twice a day for 2 more days. Then take 2 tablets once a day for 1 day. Then take 1 tablet once a day for 1 day.    PLAN:  - Spacer sample and demonstration provided. - Daily controller medication(s): Singulair '10mg'$  daily - Prior to physical activity: albuterol 2 puffs 10-15 minutes before physical activity. - Rescue medications: albuterol 4 puffs every 4-6 hours as needed - Changes during respiratory infections or worsening symptoms: Add on Flovent to 2 puffs twice daily for TWO WEEKS. - Asthma control goals:  * Full participation in all desired activities (may need albuterol before activity) * Albuterol use two time or less a week on average (not counting use with activity) * Cough interfering with sleep two time or less a month * Oral steroids no more than once a year * No hospitalizations  Mixed Allergic and nonallergic Rhinitis:   - Testing 08/25/22: postive to  [place] blue grass pollen and cocklebur weed pollen, intradermals were negative  -Continue avoidance measures - Continue taking:  Zyrtec '10mg'$  daily as needed, Flonase 1 spray per nostril as needed for stuffy nose  and Singulair (montelukast) '10mg'$  daily - You can use an extra dose of the antihistamine, if needed, for breakthrough symptoms.  - Consider nasal saline rinses 1-2 times daily to remove allergens from the nasal cavities as well as help with mucous clearance (this is especially helpful to do before the nasal sprays are given)   Burning Tongue Syndrome  Lack of response to gabapentin, negative food testing, no concern from ENT - Follow up with  dentist   Follow up: follow-up in clinic in 3 months  Thank you so much for letting me partake in your care today.  Don't hesitate to reach out if you have any additional concerns!  [name], MD  [facility] [place] [place]

## 2022-11-10 NOTE — Progress Notes (Signed)
Follow Up Note  RE: Michael Bernard MRN: [id number] DOB: 1956-08-05 Date of Office Visit: 11/10/2022  Referring provider: [name], MD Primary care provider: [name], MD  Chief Complaint: Follow-up (Pt states that he started having itchy throat, sneezing, post nasal drainage, chills and SOB since yesterday, and his symptoms is progressing. His sinuses are blocked, and he's having sinus headache.)  History of Present Illness: I had the pleasure of seeing Michael Bernard for a follow up visit at the [facility] on 11/10/2022. He is a 66 y.o. male, who is being followed for SAD, burning tongue syndrome, . His previous allergy office visit was on 10/27/22 with [name]. Today is a  acute visit for sinus infection .  History obtained from patient, chart review.  2 days ago he developed scratchy throat, which progressed to sneezing, rhinnorhea, increase sinus pressure, post nasal drip.  Wasn't able to sleep last night due to symptoms.  No fevers, mild chills and fatigue.    Today he has coughing, dyspnea and mild wheezing starting.  He has not used his albuterol.    He treated at home with alka seltzer plus.    This is usually how his sinus infections present which can lead to asthma flare.   Assessment and Plan: Michael Bernard is a 65 y.o. 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 ([place])  Other acute recurrent sinusitis  Mild persistent asthma without complication - Plan: Spirometry with Graph  Other allergic rhinitis  Burning tongue syndrome Plan: Patient Instructions  Specific Antibody Deficiency with acute sinus infection  Start Doxycycline '100mg'$  twice daily for 7 days   Mild Intermittent Asthma:  with acute exacerbation  Breathing tests showed inflammation in your lungs that was partially reversible  Prednisone '10mg'$  tablet pack: 2 tablets given in office today. Take 2 more tablets before bed  today.  Then take 2 tablets twice a day for 2 more days. Then take 2 tablets once a day for 1 day. Then take 1 tablet once a day for 1 day.    PLAN:  - Spacer sample and demonstration provided. - Daily controller medication(s): Singulair '10mg'$  daily - Prior to physical activity: albuterol 2 puffs 10-15 minutes before physical activity. - Rescue medications: albuterol 4 puffs every 4-6 hours as needed - Changes during respiratory infections or worsening symptoms: Add on Flovent to 2 puffs twice daily for TWO WEEKS. - Asthma control goals:  * Full participation in all desired activities (may need albuterol before activity) * Albuterol use two time or less a week on average (not counting use with activity) * Cough interfering with sleep two time or less a month * Oral steroids no more than once a year * No hospitalizations  Mixed Allergic and nonallergic Rhinitis:   - Testing 08/25/22: postive to  [place] blue grass pollen and cocklebur weed pollen, intradermals were negative  -Continue avoidance measures - Continue taking:  Zyrtec '10mg'$  daily as needed, Flonase 1 spray per nostril as needed for stuffy nose  and Singulair (montelukast) '10mg'$  daily - You can use an extra dose of the antihistamine, if needed, for breakthrough symptoms.  - Consider nasal saline rinses 1-2 times daily to remove allergens from the nasal cavities as well as help with mucous clearance (this is especially helpful to do before the nasal sprays are given)   Burning Tongue Syndrome  Lack of response to gabapentin, negative food testing, no  concern from ENT - Follow up with dentist   Follow up: follow-up in clinic in 3 months  Thank you so much for letting me partake in your care today.  Don't hesitate to reach out if you have any additional concerns!  [name], MD  [facility] [place] [place] No follow-ups on file.  Meds ordered this encounter  Medications   doxycycline (ADOXA) 100 MG  tablet    Sig: Take 1 tablet (100 mg total) by mouth 2 (two) times daily for 7 days.    Dispense:  14 tablet    Refill:  0    Lab Orders  No laboratory test(s) ordered today   Diagnostics: Spirometry:  Tracings reviewed. His effort: It was hard to get consistent efforts and there is a question as to whether this reflects a maximal maneuver. FVC: 2.30L FEV1: 2.00L, 64% predicted FEV1/FVC ratio: 87% Interpretation: Spirometry consistent with mixed obstructive and restrictive disease. After 4 puffs of albuterol FEV1 increased by 150cc and 9%, FVC increased by 210cc and 9% Please see scanned spirometry results for details.   Results interpreted by myself during this encounter and discussed with patient/family.   Medication List:  Current Outpatient Medications  Medication Sig Dispense Refill   APPLE CIDER VINEGAR PO Take 1 tablet by mouth daily at 6 (six) AM. GUMMY     doxycycline (ADOXA) 100 MG tablet Take 1 tablet (100 mg total) by mouth 2 (two) times daily for 7 days. 14 tablet 0   fluticasone (FLOVENT HFA) 44 MCG/ACT inhaler Inhale 2 puffs into the lungs in the morning and at bedtime. 1 each 6   gabapentin (NEURONTIN) 600 MG tablet Take 1 tablet (600 mg total) by mouth daily. 90 tablet 1   MECLIZINE HCL PO Take by mouth.     omeprazole (PRILOSEC) 40 MG capsule TAKE ONE CAPSULE (40 MG TOTAL) BY MOUTH DAILY 90 capsule 2   zolpidem (AMBIEN) 10 MG tablet TAKE 1 TABLET BY MOUTH EVERY DAY AT BEDTIME AS NEEDED FOR SLEEP 30 tablet 5   No current facility-administered medications for this visit.   Allergies: Allergies  Allergen Reactions   Trazodone And Nefazodone Other (See Comments)    Feels hung over the next day    Penicillins Hives, Itching and Rash   I reviewed his past medical history, social history, family history, and environmental history and no significant changes have been reported from his previous visit.  ROS: All others negative except as noted per HPI.    Objective: BP 130/80   Pulse 80   Temp 98.2 F (36.8 C) (Temporal)   Resp 19   Ht '5\' 5"'$  (1.651 m)   Wt 233 lb (105.7 kg)   SpO2 96%   BMI 38.77 kg/m  Body mass index is 38.77 kg/m. General Appearance:  Alert, cooperative, no distress, appears stated age  Head:  Normocephalic, without obvious abnormality, atraumatic  Eyes:  Conjunctiva clear, EOM's intact  Nose: Nares normal,  erythematous nasal mucosa with clear rhinnorhea, hypertrophic turbinates, no visible anterior polyps, and septum midline  Throat: Lips, tongue normal; teeth and gums normal, no tonsillar exudate and + cobblestoning  Neck: Supple, symmetrical  Lungs:   end-expiratory wheezing, Respirations unlabored, intermittent dry coughing  Heart:  regular rate and rhythm and no murmur, Appears well perfused  Extremities: No edema  Skin: Skin color, texture, turgor normal, no rashes or lesions on visualized portions of skin   Neurologic: No gross deficits   Previous notes and  tests were reviewed. The plan was reviewed with the patient/family, and all questions/concerned were addressed.  It was my pleasure to see Michael Bernard today and participate in his care. Please feel free to contact me with any questions or concerns.  Sincerely,  [name], MD  Allergy & Immunology  [facility] Office: [phone]

## 2022-11-12 ENCOUNTER — Telehealth: Payer: Self-pay | Admitting: Internal Medicine

## 2022-11-12 MED ORDER — TESSALON PERLES 100 MG PO CAPS: 100.0000 mg | ORAL_CAPSULE | Freq: Three times a day (TID) | ORAL | 0 refills | Status: DC | PRN

## 2022-11-12 NOTE — Telephone Encounter (Signed)
Sent in [place] and pt stated understanding

## 2022-11-12 NOTE — Telephone Encounter (Signed)
Patient is still coughing since last appointment 11-10-2022 asking if a cough syrup can be called in to help with this cough please advise

## 2022-11-12 NOTE — Addendum Note (Signed)
Addended by: Felipa Emory on: 11/12/2022 04:16 PM   Modules accepted: Orders

## 2022-11-12 NOTE — Telephone Encounter (Signed)
COLD has settled in chest he has cough syrup with codeine helps generally and mucinex generally helps and he is doing all prescribed meds.

## 2022-11-12 NOTE — Telephone Encounter (Signed)
Can you please order chest xray for this patient? Any fever, is he having a productive cough? Thank you

## 2022-11-12 NOTE — Telephone Encounter (Signed)
Can you please send in tessalon Perles 100 mg TID PRN and guaifenesin [phone] mg twice a day. Thank you

## 2022-11-12 NOTE — Telephone Encounter (Signed)
Pt stated a ct was talked about and I see we placed the order just need to see what is going on with that with scheduling.  Pt did state cough is dry for the most part but when the drainage gets going he coughs a lot

## 2022-11-19 ENCOUNTER — Telehealth: Payer: Self-pay | Admitting: Internal Medicine

## 2022-11-19 NOTE — Telephone Encounter (Signed)
[place]  NEEDS AN AUTHORIZATION FOR A CT [facility] AT [phone] EXT [phone]

## 2022-11-19 NOTE — Telephone Encounter (Signed)
Spoke to sydney at [employer] for PA for CT Chest w/o contrast with patient's insurance medicare part B. She does not need a PA for this CT chest w/o contrast. Reference number given [id number]. Spoke to [place] at [employer] scheduling and he documented that no PA needed and reference number given. Refer above.

## 2022-11-19 NOTE — Telephone Encounter (Signed)
Received a message from [place] this morning asking me to do a Precert authorizing for this patient. Will be working on doing a Tourist information centre manager for this patient. Left message with patient and Tillie Rung with [place] pre certification. Read Dr. Atilano Ina last note and wasn't quite sure what we are doing a ct scan, so I'll be waiting for either the patient or Tillie Rung to call me back regarding scheduling a ct scan.

## 2022-11-24 ENCOUNTER — Ambulatory Visit (HOSPITAL_BASED_OUTPATIENT_CLINIC_OR_DEPARTMENT_OTHER): Payer: Medicare HMO

## 2022-11-25 ENCOUNTER — Telehealth: Payer: Self-pay | Admitting: Internal Medicine

## 2022-11-25 NOTE — Telephone Encounter (Signed)
Tried calling the number provided. However, received a message stating that the office is closed and to try again later.

## 2022-11-25 NOTE — Telephone Encounter (Signed)
Spoke to Michael Bernard to let him know I spoke to [place] at imaging at [facility] off of [address] in [place]. Imaging will be calling him to r/s his chest ct scan w/o contrast. Told patient that if he hasn't heard from imaging center to call our office in the next 1-2 days.

## 2022-11-25 NOTE — Telephone Encounter (Signed)
Kendra from [facility] called for [place] to get a CT auth. From ins co. . [phone] Tillie Rung

## 2022-11-29 ENCOUNTER — Ambulatory Visit (HOSPITAL_BASED_OUTPATIENT_CLINIC_OR_DEPARTMENT_OTHER)
Admission: RE | Admit: 2022-11-29 | Discharge: 2022-11-29 | Disposition: A | Discharge status: Home / Self Care | Payer: Medicare HMO | Source: Ambulatory Visit | Attending: Internal Medicine | Admitting: Internal Medicine

## 2022-11-29 DIAGNOSIS — I2510 Atherosclerotic heart disease of native coronary artery without angina pectoris: Secondary | ICD-10-CM | POA: Diagnosis not present

## 2022-11-29 DIAGNOSIS — J439 Emphysema, unspecified: Secondary | ICD-10-CM | POA: Insufficient documentation

## 2022-11-29 DIAGNOSIS — R918 Other nonspecific abnormal finding of lung field: Secondary | ICD-10-CM | POA: Diagnosis not present

## 2022-11-29 DIAGNOSIS — D806 Antibody deficiency with near-normal immunoglobulins or with hyperimmunoglobulinemia: Secondary | ICD-10-CM | POA: Insufficient documentation

## 2022-11-30 ENCOUNTER — Telehealth: Payer: Self-pay

## 2022-11-30 NOTE — Telephone Encounter (Signed)
Pt has already had ct and no pre cert was needed

## 2022-11-30 NOTE — Progress Notes (Signed)
Patients CT chest returned with "Nonspecific scattered small round air cysts throughout both lungs with minimally perceptible thin walls."  This can be a sign of an underlying pulmonary disease  and we need to refer him to Pulmonary.  Can someone let patient know the plan and help with the referral to [facility].

## 2022-11-30 NOTE — Telephone Encounter (Signed)
Per [name]: Please send referral to [facility] due to his xray results "Nonspecific scattered small round air cysts throughout both lungs  with minimally perceptible thin walls."

## 2022-11-30 NOTE — Telephone Encounter (Signed)
Thanks! I

## 2022-11-30 NOTE — Telephone Encounter (Signed)
Patient called back as he received a voicemail regarding his results being back. I informed him that I didn't have a free nurse at the time, but Tracye did send his result note via [place]. He is going to review the note and give [place] a call if he has any questions.    Will work on his referral.

## 2022-12-01 NOTE — Telephone Encounter (Signed)
Patient called back because he was getting a lot of back and forth with getting his CT results and stated that he was forwarded to a nurse but was hung up on. I reviewed the results with the patient and advised that his referral is currently being worked on and that we will reach out to get him scheduled. Patient verbalized understanding.

## 2022-12-02 ENCOUNTER — Ambulatory Visit (INDEPENDENT_AMBULATORY_CARE_PROVIDER_SITE_OTHER): Payer: Medicare HMO | Admitting: Family Medicine

## 2022-12-02 ENCOUNTER — Encounter: Payer: Self-pay | Admitting: Family Medicine

## 2022-12-02 VITALS — BP 118/80 | HR 75 | Temp 97.7°F | Ht 68.0 in | Wt 219.4 lb

## 2022-12-02 DIAGNOSIS — R5382 Chronic fatigue, unspecified: Secondary | ICD-10-CM | POA: Diagnosis not present

## 2022-12-02 DIAGNOSIS — E538 Deficiency of other specified B group vitamins: Secondary | ICD-10-CM | POA: Diagnosis not present

## 2022-12-02 DIAGNOSIS — K146 Glossodynia: Secondary | ICD-10-CM | POA: Insufficient documentation

## 2022-12-02 DIAGNOSIS — K259 Gastric ulcer, unspecified as acute or chronic, without hemorrhage or perforation: Secondary | ICD-10-CM | POA: Diagnosis not present

## 2022-12-02 DIAGNOSIS — F5101 Primary insomnia: Secondary | ICD-10-CM

## 2022-12-02 DIAGNOSIS — K2100 Gastro-esophageal reflux disease with esophagitis, without bleeding: Secondary | ICD-10-CM

## 2022-12-02 DIAGNOSIS — I200 Unstable angina: Secondary | ICD-10-CM | POA: Diagnosis not present

## 2022-12-02 DIAGNOSIS — R42 Dizziness and giddiness: Secondary | ICD-10-CM

## 2022-12-02 DIAGNOSIS — K429 Umbilical hernia without obstruction or gangrene: Secondary | ICD-10-CM | POA: Insufficient documentation

## 2022-12-02 DIAGNOSIS — E785 Hyperlipidemia, unspecified: Secondary | ICD-10-CM

## 2022-12-02 DIAGNOSIS — R69 Illness, unspecified: Secondary | ICD-10-CM | POA: Diagnosis not present

## 2022-12-02 DIAGNOSIS — R739 Hyperglycemia, unspecified: Secondary | ICD-10-CM | POA: Diagnosis not present

## 2022-12-02 LAB — LIPID PANEL
Cholesterol: 121 mg/dL (ref 0–200)
HDL: 35.4 mg/dL — ABNORMAL LOW (ref >39.00)
LDL Cholesterol: 59 mg/dL (ref 0–99)
NonHDL: 85.92
Total CHOL/HDL Ratio: 3
Triglycerides: 133 mg/dL (ref 0.0–149.0)
VLDL: 26.6 mg/dL (ref 0.0–40.0)

## 2022-12-02 LAB — HEPATIC FUNCTION PANEL
ALT: 35 U/L (ref 0–53)
AST: 30 U/L (ref 0–37)
Albumin: 4.4 g/dL (ref 3.5–5.2)
Alkaline Phosphatase: 67 U/L (ref 39–117)
Bilirubin, Direct: 0.3 mg/dL (ref 0.0–0.3)
Total Bilirubin: 1.9 mg/dL — ABNORMAL HIGH (ref 0.2–1.2)
Total Protein: 7.8 g/dL (ref 6.0–8.3)

## 2022-12-02 LAB — CBC WITH DIFFERENTIAL/PLATELET
Basophils Absolute: 0.1 10*3/uL (ref 0.0–0.1)
Basophils Relative: 0.9 % (ref 0.0–3.0)
Eosinophils Absolute: 0.2 10*3/uL (ref 0.0–0.7)
Eosinophils Relative: 2.6 % (ref 0.0–5.0)
HCT: 50.6 % (ref 39.0–52.0)
Hemoglobin: 17.5 g/dL — ABNORMAL HIGH (ref 13.0–17.0)
Lymphocytes Relative: 20.9 % (ref 12.0–46.0)
Lymphs Abs: 1.6 10*3/uL (ref 0.7–4.0)
MCHC: 34.5 g/dL (ref 30.0–36.0)
MCV: 93.3 fl (ref 78.0–100.0)
Monocytes Absolute: 0.7 10*3/uL (ref 0.1–1.0)
Monocytes Relative: 8.8 % (ref 3.0–12.0)
Neutro Abs: 5.1 10*3/uL (ref 1.4–7.7)
Neutrophils Relative %: 66.8 % (ref 43.0–77.0)
Platelets: 353 10*3/uL (ref 150.0–400.0)
RBC: 5.43 Mil/uL (ref 4.22–5.81)
RDW: 13.2 % (ref 11.5–15.5)
WBC: 7.6 10*3/uL (ref 4.0–10.5)

## 2022-12-02 LAB — BASIC METABOLIC PANEL
BUN: 10 mg/dL (ref 6–23)
CO2: 29 mEq/L (ref 19–32)
Calcium: 9.9 mg/dL (ref 8.4–10.5)
Chloride: 102 mEq/L (ref 96–112)
Creatinine, Ser: 1.06 mg/dL (ref 0.40–1.50)
GFR: 73.78 mL/min (ref >60.00)
Glucose, Bld: 88 mg/dL (ref 70–99)
Potassium: 4.4 mEq/L (ref 3.5–5.1)
Sodium: 140 mEq/L (ref 135–145)

## 2022-12-02 LAB — PSA: PSA: 1.55 ng/mL (ref 0.10–4.00)

## 2022-12-02 LAB — HEMOGLOBIN A1C: Hgb A1c MFr Bld: 5.5 % (ref 4.6–6.5)

## 2022-12-02 LAB — VITAMIN B12: Vitamin B-12: 434 pg/mL (ref 211–911)

## 2022-12-02 LAB — TSH: TSH: 2.24 u[IU]/mL (ref 0.35–5.50)

## 2022-12-02 MED ORDER — OMEPRAZOLE 40 MG PO CPDR: 40.0000 mg | DELAYED_RELEASE_CAPSULE | Freq: Every day | ORAL | 3 refills | Status: DC

## 2022-12-02 MED ORDER — PREGABALIN 300 MG PO CAPS: 300.0000 mg | ORAL_CAPSULE | Freq: Every day | ORAL | 5 refills | Status: DC

## 2022-12-02 NOTE — Progress Notes (Signed)
Subjective:    Patient ID: Michael Bernard, male    DOB: 1955-11-28, 67 y.o.   MRN: [id number]  HPI Here to follow up on issues. 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 He has been seeing Dr. [name] of [facility] to investigate his various immune deficiencies, and she feels that the burning mouth is related ot an immune issue. She has referred him to Dr. Keane Police of the [facility] to be examined and likely to have a biopsy performed. He has had this syndrome for about 3 years, and it bothers him a lot./ he has constant burning in the cheeks, tongue, and lips, and eating any type of food is difficult. He has to avoid foods that require much chewing, so he eats very little meat. He avoids anything spicy or acidic. He cannot chew nuts. [name] let him try taking Gabapentin 600 mg at bedtime, and this helped him sleep. However it did not help the burning mouth at all. He takes Zolpidem at bedtime, and this helps, but he still has trouble sleeping through the night.  He had a chest CT on 11-29-22, and this revealed many scattered small round air cysts throughout both lungs. He has been referred to Pulmonology to assess this.   Review of Systems  Constitutional: Negative.   HENT:  Positive for mouth sores. Negative for trouble swallowing.   Eyes: Negative.   Respiratory: Negative.    Cardiovascular: Negative.   Gastrointestinal: Negative.   Genitourinary: Negative.   Musculoskeletal: Negative.   Skin: Negative.   Neurological: Negative.   Psychiatric/Behavioral: Negative.         Objective:   Physical Exam Constitutional:      General: He is not in acute distress.    Appearance: Normal appearance. He is well-developed. He is not diaphoretic.  HENT:     Head: Normocephalic and atraumatic.     Right Ear: External ear normal.     Left Ear: External ear normal.     Nose: Nose normal.     Mouth/Throat:     Mouth: Mucous membranes are  moist.     Pharynx: No oropharyngeal exudate or posterior oropharyngeal erythema.     Comments: The tongue has numerous shallow fissures  Eyes:     General: No scleral icterus.       Right eye: No discharge.        Left eye: No discharge.     Conjunctiva/sclera: Conjunctivae normal.     Pupils: Pupils are equal, round, and reactive to light.  Neck:     Thyroid: No thyromegaly.     Vascular: No JVD.     Trachea: No tracheal deviation.  Cardiovascular:     Rate and Rhythm: Normal rate and regular rhythm.     Heart sounds: Normal heart sounds. No murmur heard.    No friction rub. No gallop.  Pulmonary:     Effort: Pulmonary effort is normal. No respiratory distress.     Breath sounds: Normal breath sounds. No wheezing or rales.  Chest:     Chest wall: No tenderness.  Abdominal:     General: Bowel sounds are normal. There is no distension.     Palpations: Abdomen is soft. There is no mass.     Tenderness: There is no abdominal tenderness. There is no guarding or rebound.     Comments: There is a moderate sized non-tender easily reducible hernia just superior to the umbilicus  Genitourinary:    Penis: Normal. No tenderness.      Testes: Normal.     Prostate: Normal.     Rectum: Normal. Guaiac result negative.  Musculoskeletal:        General: No tenderness. Normal range of motion.     Cervical back: Neck supple.  Lymphadenopathy:     Cervical: No cervical adenopathy.  Skin:    General: Skin is warm and dry.     Coloration: Skin is not pale.     Findings: No erythema or rash.  Neurological:     General: No focal deficit present.     Mental Status: He is alert and oriented to person, place, and time.     Cranial Nerves: No cranial nerve deficit.     Motor: No abnormal muscle tone.     Coordination: Coordination normal.     Deep Tendon Reflexes: Reflexes are normal and symmetric. Reflexes normal.  Psychiatric:        Mood and Affect: Mood normal.        Behavior: Behavior  normal.        Thought Content: Thought content normal.        Judgment: Judgment normal.           Assessment & Plan:  He still struggles with burning mouth syndrome. He will see Oral Surgery soon to evaluate. We will let him try taking Pregabalin 300 mg at bedtime to see if this helps his mouth. It will also probably help him sleep. He will see Pulmonology for the abnormal chest CT findings. We will get fasting labs to check lipids, B12, etc. His GERD is stable. We will continue to monitor the umbilical hernia. We spent a total of ( 35  ) minutes reviewing records and discussing these issues.  Alysia Penna, MD

## 2022-12-03 ENCOUNTER — Telehealth: Payer: Self-pay

## 2022-12-03 NOTE — Telephone Encounter (Signed)
PA SUBMITTED THRU COVER MY MEDS KEY [phone] WAITING ON RESPONSE FORM INSURANCE

## 2022-12-07 ENCOUNTER — Other Ambulatory Visit: Payer: Self-pay | Admitting: Internal Medicine

## 2022-12-07 MED ORDER — ARNUITY ELLIPTA 50 MCG/ACT IN AEPB: 2.0000 | INHALATION_SPRAY | Freq: Two times a day (BID) | RESPIRATORY_TRACT | 3 refills | Status: DC

## 2022-12-07 NOTE — Telephone Encounter (Signed)
Pa can not be processed but arnuity is preferred. Please advise to change in therapy

## 2022-12-07 NOTE — Telephone Encounter (Signed)
Pt informed of change

## 2022-12-07 NOTE — Telephone Encounter (Signed)
We can change to arnuity 62mg 2 puffs twice daily, prescription has already been placed

## 2022-12-10 ENCOUNTER — Other Ambulatory Visit: Payer: Self-pay

## 2022-12-10 NOTE — Telephone Encounter (Signed)
Pts insurance will not over 2 puffs daily but will cover 1 puff daily please advise to change in therapy

## 2022-12-13 MED ORDER — ARNUITY ELLIPTA 50 MCG/ACT IN AEPB: 1.0000 | INHALATION_SPRAY | Freq: Every day | RESPIRATORY_TRACT | 5 refills | Status: DC

## 2022-12-13 NOTE — Telephone Encounter (Signed)
Ok lets do the 100 mcg dose 1 puff daily.  Thanks!

## 2022-12-13 NOTE — Telephone Encounter (Signed)
Sent in arnuity 50 1 puff daily

## 2022-12-13 NOTE — Addendum Note (Signed)
Addended by: Felipa Emory on: 12/13/2022 01:35 PM   Modules accepted: Orders

## 2022-12-21 ENCOUNTER — Telehealth: Payer: Self-pay | Admitting: Internal Medicine

## 2022-12-21 NOTE — Telephone Encounter (Signed)
Patient called asking for a  call from the nurse patient has head congestion deny's fever please advise

## 2022-12-21 NOTE — Telephone Encounter (Signed)
If symptoms lasting more than 72 hours I recommend antibiotics given his specific antbody deficiency.  Can we verify duration of symptoms?  Thanks!

## 2022-12-21 NOTE — Telephone Encounter (Signed)
Head feels stuffy, post nasal drip, no fever, doing otc coughing and congestion syrupm and doing all other prescribed meds. Pts phglem has no color

## 2022-12-22 MED ORDER — DOXYCYCLINE HYCLATE 100 MG PO TABS: 100.0000 mg | ORAL_TABLET | Freq: Two times a day (BID) | ORAL | 0 refills | Status: DC

## 2022-12-22 NOTE — Telephone Encounter (Signed)
Ok,  lets start doxycyline '100mg'$  twice a day for 7 days.  Thanks!

## 2022-12-22 NOTE — Telephone Encounter (Signed)
Symptoms started Monday morning like a sinus infection and getting worse last night he was running a fever around 11fbut this am he has no signs

## 2022-12-22 NOTE — Telephone Encounter (Signed)
Sent in rx and informed pt of rx and directions and verified pharmacy

## 2022-12-27 ENCOUNTER — Encounter: Payer: Self-pay | Admitting: Internal Medicine

## 2022-12-27 ENCOUNTER — Ambulatory Visit: Payer: Medicare HMO | Admitting: Internal Medicine

## 2022-12-27 VITALS — BP 130/70 | HR 70 | Temp 97.9°F | Resp 17 | Wt 223.5 lb

## 2022-12-27 DIAGNOSIS — J0181 Other acute recurrent sinusitis: Secondary | ICD-10-CM | POA: Diagnosis not present

## 2022-12-27 DIAGNOSIS — J4530 Mild persistent asthma, uncomplicated: Secondary | ICD-10-CM | POA: Diagnosis not present

## 2022-12-27 DIAGNOSIS — J3089 Other allergic rhinitis: Secondary | ICD-10-CM | POA: Diagnosis not present

## 2022-12-27 DIAGNOSIS — D806 Antibody deficiency with near-normal immunoglobulins or with hyperimmunoglobulinemia: Secondary | ICD-10-CM | POA: Diagnosis not present

## 2022-12-27 DIAGNOSIS — R9389 Abnormal findings on diagnostic imaging of other specified body structures: Secondary | ICD-10-CM

## 2022-12-27 DIAGNOSIS — K146 Glossodynia: Secondary | ICD-10-CM

## 2022-12-27 MED ORDER — DOXYCYCLINE MONOHYDRATE 100 MG PO TABS: 100.0000 mg | ORAL_TABLET | Freq: Two times a day (BID) | ORAL | 0 refills | Status: AC

## 2022-12-27 NOTE — Patient Instructions (Signed)
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 - Daily controller medication(s): Singulair '10mg'$  daily - Prior to physical activity: albuterol 2 puffs 10-15 minutes before physical activity. - Rescue medications: albuterol 4 puffs every 4-6 hours as needed - Changes during respiratory infections or worsening symptoms: Add on Arnuity  1 puff twice daily for TWO WEEKS. - Asthma control goals:  * Full participation in all desired activities (may need albuterol before activity) * Albuterol use two time or less a week on average (not counting use with activity) * Cough interfering with sleep two time or less a month * Oral steroids no more than once a year * No hospitalizations  Mixed Allergic and nonallergic Rhinitis:   - Testing 08/25/22: postive to  [place] blue grass pollen and cockle[place] weed pollen, intradermals were negative  -Continue avoidance measures - Continue taking:  Zyrtec '10mg'$  daily as needed, Flonase 1 spray per nostril as needed for stuffy nose  and Singulair (montelukast) '10mg'$  daily - You can use an extra dose of the antihistamine, if needed, for breakthrough symptoms.  - Consider nasal saline rinses 1-2 times daily to remove allergens from the nasal cavities as well as help with mucous clearance (this is especially helpful to do before the nasal sprays are given)   Burning Tongue Syndrome  Continue pregabalin negative food testing, no concern from ENT - Follow up with oral surgeon    Follow up: follow-up in clinic in 3 months  Thank you so much for letting me partake in your care today.  Don't hesitate to reach out if you have any additional  concerns!  [name], MD  [facility] [place]

## 2022-12-27 NOTE — Progress Notes (Signed)
Follow Up Note  RE: Michael Bernard MRN: [id number] DOB: 02-12-1956 Date of Office Visit: 12/27/2022  Referring provider: [name], MD Primary care provider: [name], MD  Chief Complaint: Follow-up (Pt states he's been sick with a possible sinus infection he's starting feel better.), Sinus Problem, and Nasal Congestion  History of Present Illness: I had the pleasure of seeing Michael Bernard for a follow up visit at the [facility] on 01/03/2023. He is a 67 y.o. male, who is being followed for SAD, persistent asthma, recurrent sinusitis, allergic rhinitis . His previous allergy office visit was on 11/10/22 with [name]. Today is a regular follow up visit.  History obtained from patient, chart review. At last visit CT returned with multple thin walled cysts and he was referred to pulmonary.  His appointment is scheduled for 12/28/22.    He contacted the clinic while out of town due to increased sinus pressure, tenderness, headaches, rhinnorhea, nasal congestion and malaise.  He was prescribed 7 days of doxycycline and is here for follow up.   Today he reports his sinus symptoms have improved partially but not fully.  This is is second infection since his diagnosis of SAD.  He has continued with Zyrtec daily, Flonase (per nostril daily for comorbid allergic rhinitis.  His PCP started him on pregabalin which has helped his burning mouth syndrome somewhat.  He was scheduled for tongue biopsy in December but it has been postponed due to abnormal chest CT and recurrent infections.  He is open to treatment options for SAD to include SCIG.    For his asthma he denies any dyspnea, wheezing, chest tightness, cough.  Has not needed to use his Arnuity.  He is maintained singular 10 mg daily.    Assessment and Plan: Laterrance is a 67 y.o. 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 - Daily controller medication(s): Singulair 11m daily - Prior to physical activity: albuterol 2 puffs 10-15 minutes before physical activity. - Rescue medications: albuterol 4 puffs every 4-6 hours as needed - Changes during respiratory infections or worsening symptoms: Add on Arnuity  1 puff twice daily for TWO WEEKS. - Asthma control goals:  * Full participation in all desired activities (may need albuterol before activity) * Albuterol use two time or less a week on average (not counting use with activity) * Cough interfering with sleep two time or less a month * Oral steroids no more than once a year * No hospitalizations  Mixed Allergic and nonallergic Rhinitis:   - Testing 08/25/22: postive to  [place] blue grass pollen and [place] weed pollen, intradermals were negative  -Continue avoidance measures - Continue taking:  Zyrtec 150mdaily as needed, Flonase 1 spray per nostril as needed for stuffy nose  and Singulair (montelukast) 1035maily - You can use an extra dose of the antihistamine, if needed, for breakthrough symptoms.  - Consider  nasal saline rinses 1-2 times daily to remove allergens from the nasal cavities as well as help with mucous clearance (this is especially helpful to do before the nasal sprays are given)   Burning Tongue Syndrome  Continue pregabalin negative food testing, no concern from ENT - Follow up with oral surgeon    Follow up:  follow-up in clinic in 3 months  Thank you so much for letting me partake in your care today.  Don't hesitate to reach out if you have any additional concerns!  [name], MD  [facility] [place]   Meds ordered this encounter  Medications   doxycycline (ADOXA) 100 MG tablet    Sig: Take 1 tablet (100 mg total) by mouth 2 (two) times daily for 7 days.    Dispense:  14 tablet    Refill:  0    Lab Orders  No laboratory test(s) ordered today   Diagnostics: None done    Medication List:  Current Outpatient Medications  Medication Sig Dispense Refill   doxycycline (ADOXA) 100 MG tablet Take 1 tablet (100 mg total) by mouth 2 (two) times daily for 7 days. 14 tablet 0   Fluticasone Furoate (ARNUITY ELLIPTA) 50 MCG/ACT AEPB Inhale 1 puff into the lungs daily. 60 each 5   MECLIZINE HCL PO Take by mouth.     omeprazole (PRILOSEC) 40 MG capsule Take 1 capsule (40 mg total) by mouth daily. 90 capsule 3   pregabalin (LYRICA) 300 MG capsule Take 1 capsule (300 mg total) by mouth at bedtime. 30 capsule 5   TESSALON PERLES 100 MG capsule Take 1 capsule (100 mg total) by mouth 3 (three) times daily as needed for cough. 20 capsule 0   zolpidem (AMBIEN) 10 MG tablet TAKE 1 TABLET BY MOUTH EVERY DAY AT BEDTIME AS NEEDED FOR SLEEP 30 tablet 5   No current facility-administered medications for this visit.   Allergies: Allergies  Allergen Reactions   Trazodone And Nefazodone Other (See Comments)    Feels hung over the next day    Penicillins Hives, Itching and Rash   I reviewed his past medical history, social history, family history, and environmental history and no significant changes have been reported from his previous visit.  ROS: All others negative except as noted per HPI.   Objective: BP 130/70   Pulse 70   Temp 97.9 F (36.6 C) (Temporal)   Resp 17   Wt 223 lb 8 oz (101.4 kg)   SpO2 97%   BMI 33.98 kg/m  Body mass index is 33.98 kg/m. General  Appearance:  Alert, cooperative, no distress, appears stated age  Head:  Normocephalic, without obvious abnormality, atraumatic  Eyes:  Conjunctiva clear, EOM's intact  Nose: Nares normal,  yellow rhinnorhea, sinus tenderness, erythematous nasal mucosa , hypertrophic turbinates, no visible anterior polyps, and septum midline  Throat: Lips, tongue normal; teeth and gums normal, + cobblestoning  Neck: Supple, symmetrical  Lungs:   clear to auscultation bilaterally, Respirations unlabored, intermittent dry coughing  Heart:  regular rate and rhythm and no murmur, Appears well perfused  Extremities: No edema  Skin: Skin color, texture, turgor normal, no rashes or lesions on visualized portions of skin  Neurologic: No gross deficits   Previous notes and tests were reviewed. The plan was reviewed with the patient/family, and all questions/concerned were addressed.  It was my pleasure to see Michael Bernard today and participate in his care. Please feel free to contact me with any  questions or concerns.  Sincerely,  [name], MD  Allergy & Immunology  [facility] Office: [phone]

## 2022-12-28 ENCOUNTER — Ambulatory Visit: Payer: Medicare HMO | Admitting: Pulmonary Disease

## 2022-12-28 ENCOUNTER — Encounter: Payer: Self-pay | Admitting: Pulmonary Disease

## 2022-12-28 VITALS — BP 128/66 | HR 75 | Ht 68.0 in | Wt 223.0 lb

## 2022-12-28 DIAGNOSIS — J849 Interstitial pulmonary disease, unspecified: Secondary | ICD-10-CM | POA: Diagnosis not present

## 2022-12-28 NOTE — Progress Notes (Signed)
$'@Patient'g$  ID: Michael Bernard, male    DOB: 1956-09-13, 67 y.o.   MRN: [id number]  Chief Complaint  Patient presents with   Consult    Pt is here for consult for possible scattered small round air cysts in both lungs. Pt states he used to be really active and after he retired he is struggling to walk and do any exercise. Pt states he runs out of breath a lot of the time. 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 Been to several docs for it.     Referring provider: [name], MD  HPI:   67 y.o. man we are seeing for evaluation of shortness of breath, cough as well as cystic-like lesions on CT scan.  Note from referring provider reviewed.  Most recent PCP note reviewed.  Patient describes onset of dyspnea on exertion starting around the time he retired.  Couple years ago.  Started prior to retirement.  Previously, during his work, he walked 10+ miles a day.  Likely even more.  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  But again, for the most of his career he did not have any shortness of breath.  Walked quite extensively, was very active.  Since retirement over the last couple years progressive dyspnea as described.  On exertion.  Worse on inclines or stairs in particular.  No time of day when things are better or worse.  No position make things better or worse.  No other relieving or exacerbating factors.  Has tried albuterol but does not think it is helped very much.  He does note he had several sinus infections, viral infections over the last few weeks to months which limits his ability to assess the effectiveness.  In addition, he describes a productive cough.  Present for years.  Possibly for a decade or more.  Productive of phlegm.  Worse in the mornings shortly after waking up.  He does endorse nasal congestion.  Denies much rhinorrhea or postnasal drip.  He reports a hiatal hernia.  History of heartburn.   Although does not think his symptoms are very bad..  Recently he was prescribed Arnuity for presumed asthma given productive cough and shortness of breath.  He is yet to start this.  It sounds like his PCP was concerned for possible stroke in 2021.  An outpatient CTA neck as well as CT head was ordered.  On the visible upper portion of the lung, apices and upper portion of bilateral upper lobes reveals scattered cystic findings within the walls.  With ongoing recent workup for shortness of breath by his allergist, a CT chest was ordered which shows scattered thin-walled cystic lesions throughout both in the periphery and central areas otherwise clear lungs on my review interpretation.  It appears he has a immunodeficiency diagnosed by his allergist.  The feeling is that may be the reason why he has so many recurrent sinus infections etc.  He does endorse history of pneumonia as a child.  Several when he was a child.  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  He describes the symptoms in detail as well as improvement with pregabalin.  Seems worse throughout the day better in the mornings.  He wears CPAP for OSA and he thinks with his mouth being close while he sleeps the sensation of burning is improved but as he talks and agrees to his mouth during the day the burning worsens.  He denies history of cigarette smoking.  Smokes cigars 1 or so a day for a couple years from time of 10 early 30s.  Has quit and no smoking since then.  PFTs in 2022 fully interpreted below but demonstrate normal versus mild reduction in TLC and DLCO.  He had serial spirometry in the interim which shows decreased FEV1 and FVC.  He had TTE 05/2021 that demonstrates normal valves  as well as normal right-sided Cavities and normal estimated PASP.  He had a left heart cath 05/2021 that showed 20% RCA lesion, otherwise no significant lesions, nonobstructive CAD, LVEDP reported at 10.  Questionaires / Pulmonary Flowsheets:   ACT:  Asthma Control Test ACT Total Score  12/27/2022 11:00 AM 13    MMRC:     No data to display          Epworth:     11/07/2017    1:00 PM  Results of the Epworth flowsheet  Sitting and reading 3  Watching TV 3  Sitting, inactive in a public place (e.g. a theatre or a meeting) 3  As a passenger in a car for an hour without a break 0  Lying down to rest in the afternoon when circumstances permit 3  Sitting and talking to someone 0  Sitting quietly after a lunch without alcohol 0  In a car, while stopped for a few minutes in traffic 0  Total score 12    Tests:   FENO:  No results found for: "NITRICOXIDE"  PFT:    Latest Ref Rng & Units 02/07/2018    9:59 AM  PFT Results  FVC-Pre L 3.53   FVC-Predicted Pre % 79   FVC-Post L 3.49   FVC-Predicted Post % 78   Pre FEV1/FVC % % 83   Post FEV1/FCV % % 83   FEV1-Pre L 2.92   FEV1-Predicted Pre % 87   FEV1-Post L 2.90   DLCO uncorrected ml/min/mmHg 23.00   DLCO [facility]% % 77   DLVA Predicted % 100   TLC L 5.09   TLC % Predicted % 77   RV % Predicted % 75   Prior PFT 2018 personally reviewed and interpreted as spirometry just of moderate striction versus air trapping, no bronchodilator response, lung volumes not reported low low normal but TLC is 77% of predicted, mildly reduced to right lower limit of normal likely, DLCO similarly 77% predicted, suspect normal versus mildly reduced.  Given mild reduction in TLC and DLCO, this likely reflects underlying mild interstitial lung disease.  WALK:      No data to display          Imaging: CT CHEST WO CONTRAST  Result Date: 11/29/2022 CLINICAL DATA:  Respiratory illness, immunodeficiency, xray done Bronchietasis in  immunodeficiency patient. Cough and dyspnea since December. EXAM: CT CHEST WITHOUT CONTRAST TECHNIQUE: Multidetector CT imaging of the chest was performed following the standard protocol without IV contrast. RADIATION DOSE REDUCTION: This exam was performed according to the departmental dose-optimization program which includes automated exposure control, adjustment of the mA and/or kV according to patient size and/or use of iterative reconstruction technique. COMPARISON:  05/21/2021 chest radiograph. FINDINGS: Cardiovascular: Normal heart size. No significant pericardial effusion/thickening. Left anterior  descending coronary atherosclerosis. Great vessels are normal in course and caliber. Mediastinum/Nodes: No significant thyroid nodules. Unremarkable esophagus. No pathologically enlarged axillary, mediastinal or hilar lymph nodes, noting limited sensitivity for the detection of hilar adenopathy on this noncontrast study. Nonenlarged coarsely calcified subcarinal and bilateral hilar nodes compatible with prior granulomatous disease. Lungs/Pleura: No pneumothorax. No pleural effusion. No acute consolidative airspace disease or lung masses. Tiny subpleural solid 0.2 cm anterior left upper lobe pulmonary nodule (series 4/image 43). No additional significant pulmonary nodules. A few scattered thin parenchymal bands in the mid to lower lungs, most prominent in the left lower lobe, compatible with nonspecific mild postinfectious/postinflammatory scarring. No significant regions of bronchiectasis. Mild centrilobular emphysema, for example in the medial right upper lobe on series 4/image 37. Additional scattered small round air cysts throughout the lungs bilaterally with minimally perceptible thin walls, for example measuring 1.5 cm in the medial left lower lobe (series 4/image 85). Upper abdomen: No acute abnormality. Musculoskeletal: No aggressive appearing focal osseous lesions. Mild to moderate thoracic spondylosis.  IMPRESSION: 1. Nonspecific scattered small round air cysts throughout both lungs with minimally perceptible thin walls. Differential considerations include Birt Lynelle Smoke syndrome, Langerhans cell histiocytosis if the patient is a smoker or less likely lymphocytic interstitial pneumonia (LIP). 2. Tiny subpleural solid 0.2 cm anterior left upper lobe pulmonary nodule. Although likely benign, given risk factors a non-contrast chest CT can be considered in 12 months.This recommendation follows the consensus statement: Guidelines for Management of Incidental Pulmonary Nodules Detected on CT Images: From the Fleischner Society 2017; Radiology 2017; 284:228-243. 3. One vessel coronary atherosclerosis. 4.  Emphysema (ICD10-J43.9). Electronically Signed   By: Ilona Sorrel M.D.   On: 11/29/2022 16:50    Lab Results:  CBC    Component Value Date/Time   WBC 7.6 12/02/2022 0841   RBC 5.43 12/02/2022 0841   HGB 17.5 (H) 12/02/2022 0841   HCT 50.6 12/02/2022 0841   PLT 353.0 12/02/2022 0841   MCV 93.3 12/02/2022 0841   MCH 31.1 05/21/2021 2337   MCHC 34.5 12/02/2022 0841   RDW 13.2 12/02/2022 0841   LYMPHSABS 1.6 12/02/2022 0841   MONOABS 0.7 12/02/2022 0841   EOSABS 0.2 12/02/2022 0841   BASOSABS 0.1 12/02/2022 0841    BMET    Component Value Date/Time   NA 140 12/02/2022 0841   K 4.4 12/02/2022 0841   CL 102 12/02/2022 0841   CO2 29 12/02/2022 0841   GLUCOSE 88 12/02/2022 0841   BUN 10 12/02/2022 0841   CREATININE 1.06 12/02/2022 0841   CREATININE 0.97 10/14/2020 1141   CALCIUM 9.9 12/02/2022 0841   GFRNONAA >60 05/22/2021 0233   GFRNONAA 83 10/14/2020 1141   GFRAA 96 10/14/2020 1141    BNP No results found for: "BNP"  ProBNP No results found for: "PROBNP"  Specialty Problems       Pulmonary Problems   Dyspnea on exertion    PFT 02/07/18- Mild restriction, mild Diffusion reduction, FVC 3.49/78%, FEV1 2.90/86%, ratio 0.83, FEF 25-75% 3.01/108%, No R to BD, TLC 77%, DLCO 77%       Obstructive sleep apnea    HST 12/14/17-AHI 37.9/hour, desaturation to 68%, body weight 225 pounds      Seasonal and perennial allergic rhinitis    Remote allergy skin testing positive for dust mite and other common environmental allergens.       Allergies  Allergen Reactions   Trazodone And Nefazodone Other (See Comments)    Feels hung over the next day  Penicillins Hives, Itching and Rash    Immunization History  Administered Date(s) Administered   Influenza,inj,Quad PF,6+ Mos 08/24/2017, 08/31/2018   Influenza-Unspecified 10/07/2020   PFIZER(Purple Top)SARS-COV-2 Vaccination 02/05/2020, 03/11/2020, 10/23/2020    Past Medical History:  Diagnosis Date   Anxiety    situational anxiety   Arthritis    LEFT knee   Asthma    Cancer ([place]), Melanoma    skin x 3-4 times   GERD (gastroesophageal reflux disease)    on meds   Hiatal hernia    Kidney stones    Multiple gastric ulcers 2015   seen on endoscopy per Dr. Lyndel Safe in [place]    Seasonal allergies    Sleep apnea    sees Dr. Keturah Barre, uses CPAP     Tobacco History: Social History   Tobacco Use  Smoking Status Former   Years: 3.00   Types: Cigarettes   Quit date: 06/11/1979   Years since quitting: 43.5  Smokeless Tobacco Never  Tobacco Comments   pt reports smoker 0.5 pack/month as a teenager 06/11/2019   Counseling given: Not Answered Tobacco comments: pt reports smoker 0.5 pack/month as a teenager 06/11/2019   Continue to not smoke  Outpatient Encounter Medications as of 12/28/2022  Medication Sig   doxycycline (ADOXA) 100 MG tablet Take 1 tablet (100 mg total) by mouth 2 (two) times daily for 7 days.   Fluticasone Furoate (ARNUITY ELLIPTA) 50 MCG/ACT AEPB Inhale 1 puff into the lungs daily.   MECLIZINE HCL PO Take by mouth.   omeprazole (PRILOSEC) 40 MG capsule Take 1 capsule (40 mg total) by mouth daily.   pregabalin (LYRICA) 300 MG capsule Take 1 capsule (300 mg total) by mouth at bedtime.    TESSALON PERLES 100 MG capsule Take 1 capsule (100 mg total) by mouth 3 (three) times daily as needed for cough.   zolpidem (AMBIEN) 10 MG tablet TAKE 1 TABLET BY MOUTH EVERY DAY AT BEDTIME AS NEEDED FOR SLEEP   [DISCONTINUED] Fluticasone Furoate (ARNUITY ELLIPTA) 50 MCG/ACT AEPB Inhale 2 puffs into the lungs in the morning and at bedtime.   No facility-administered encounter medications on file as of 12/28/2022.     Review of Systems  Review of Systems  No chest pain exertion.  No orthopnea or PND.  Comprehensive review of systems otherwise negative. Physical Exam  BP 128/66 (BP Location: Left Arm, Patient Position: Sitting, Cuff Size: Normal)   Pulse 75   Ht '5\' 8"'$  (1.727 m)   Wt 223 lb (101.2 kg)   SpO2 97%   BMI 33.91 kg/m   Wt Readings from Last 5 Encounters:  12/28/22 223 lb (101.2 kg)  12/27/22 223 lb 8 oz (101.4 kg)  12/02/22 219 lb 6.4 oz (99.5 kg)  11/10/22 233 lb (105.7 kg)  09/22/22 229 lb 8 oz (104.1 kg)    BMI Readings from Last 5 Encounters:  12/28/22 33.91 kg/m  12/27/22 33.98 kg/m  12/02/22 33.36 kg/m  11/10/22 38.77 kg/m  09/22/22 35.60 kg/m     Physical Exam General: Sitting in chair, no acute distress Eyes: EOMI, no icterus Neck: Supple, no JVP Pulmonary: Clear, normal work of breathing Cardiovascular: Warm, no edema, regular rhythm Abdomen: Nondistended, bowel sounds present MSK: No synovitis , no joint effusion Neuro: Normal gait, no weakness Psych: Normal mood, full affect   Assessment & Plan:   Chronic cough: Present for years, suspect decade or more.  Will be hard to control.  Likely combination of upper airway cough syndrome  given his description of nasal congestion as well as hiatal hernia and history of reflux.  Possible contribution of asthma given shortness of breath and productive cough.  Cough is spasmodic on exam with deep inspiration, suspicious for bronchospasm.  Encouraged him to start his Arnuity, ICS inhaler to see if this  helps.  Would advocate for serial increasing and inhalers to ICS/LABA, potentially ICS/LABA/LAMA to see if there is further improvement.  If not, would target intranasal therapies for nasal congestion, likely postnasal drip.  Cough does not seem to be improved on his pregabalin and gabapentin has been on the past for burning mouth syndrome.  Dyspnea on exertion: Suspect multifactorial.  Certainly element of deconditioning given his level activity when working, walking 10+ miles a day to now very limited activity.  In addition, concern for asthma as discussed above.  Therapies as above.  PFTs in the past showed maybe mild restriction and mildly reduced DLCO.  Would like to repeat but with his significant coughing I do not think complete maneuvers.  Need to repeat PFTs once cough under better control ideally.  Fortunately, cardiac workup in 2022 was all relatively normal.  Cystic lung disease: Likely effect of underlying ILD.  Given his history of pneumonias and recurrent infections pneumatoceles are considered.  There are thin-walled cyst.  He has burning mouth.  Do wonder if this is a manifestation of underlying Sjogren's although review of literature indicates not a clear connection.  With a thin-walled cysts, burning mouth, as well as report of her diagnosis of immunodeficiency per immunologist of specific antibody deficiency with normal immunoglobulin concentration and normal number of B cells, suspicion for LIP relatively high even though epidemiologically it is rare.  Other considerations include Langerhans but given lack of smoking history this felt less likely.  But Bary Richard considered but given other history felt less likely, no characteristic skin manifestations as well.  Referral to Dr. Lyda Kalata at [facility] for further evaluation.   Return in about 3 months (around 03/28/2023).   [place] Clam, MD 12/28/2022   This appointment required 60 minutes of patient care (this includes precharting,  chart review, review of results, face-to-face care, etc.).

## 2022-12-28 NOTE — Patient Instructions (Addendum)
Nice to meet you  I agree with using inhalers for asthma given the productive cough as well as shortness of breath.  It is likely will need to increase inhalers over time but the Arnuity is a good start.  For the changes on your CT scan, these are present at least dating back to 2021.  I suspect they have been there for a while.  Possibilities are they related to old infections, there is called pneumatocele.  However, I think it is reasonable to evaluate other causes even rare causes of this including lymphoid interstitial pneumonia (LIP).  LIP is associated with immunodeficiencies as well as some illnesses that cause dry mouth.  For further evaluation of this, I have referred you to Dr. Lyda Kalata at [facility].  Return to clinic in 3 months or sooner as needed with Dr. Silas Flood

## 2023-01-01 ENCOUNTER — Other Ambulatory Visit: Payer: Self-pay | Admitting: Family Medicine

## 2023-01-19 ENCOUNTER — Telehealth: Payer: Self-pay | Admitting: *Deleted

## 2023-01-19 NOTE — Telephone Encounter (Signed)
-----   Message from [name], MD sent at 01/03/2023  8:18 AM EST ----- Would like to start this patient on subcutaneous immunoglobulin at 166m/kg weekly.  We can go with whichever product his insurance covers.  Current weight 101 kg.  Diagnosis is specific antibody deficiency.  Thanks!

## 2023-01-19 NOTE — Telephone Encounter (Signed)
Called patient and advised approval for [facility] on preferred list and submit to [facility] or delivery and home nursing setup

## 2023-01-24 ENCOUNTER — Telehealth: Payer: Self-pay | Admitting: Internal Medicine

## 2023-01-24 ENCOUNTER — Other Ambulatory Visit: Payer: Self-pay | Admitting: Family Medicine

## 2023-01-24 IMAGING — CR DG CHEST 2V
2 series · 2 of 2 positions shown · non-contrast
Comparison: March 03, 2012.

CLINICAL DATA: Chest pain, intermittent chest pressure worsen by
exertion that started several weeks ago.

EXAM:
CHEST - 2 VIEW

[chest pa]
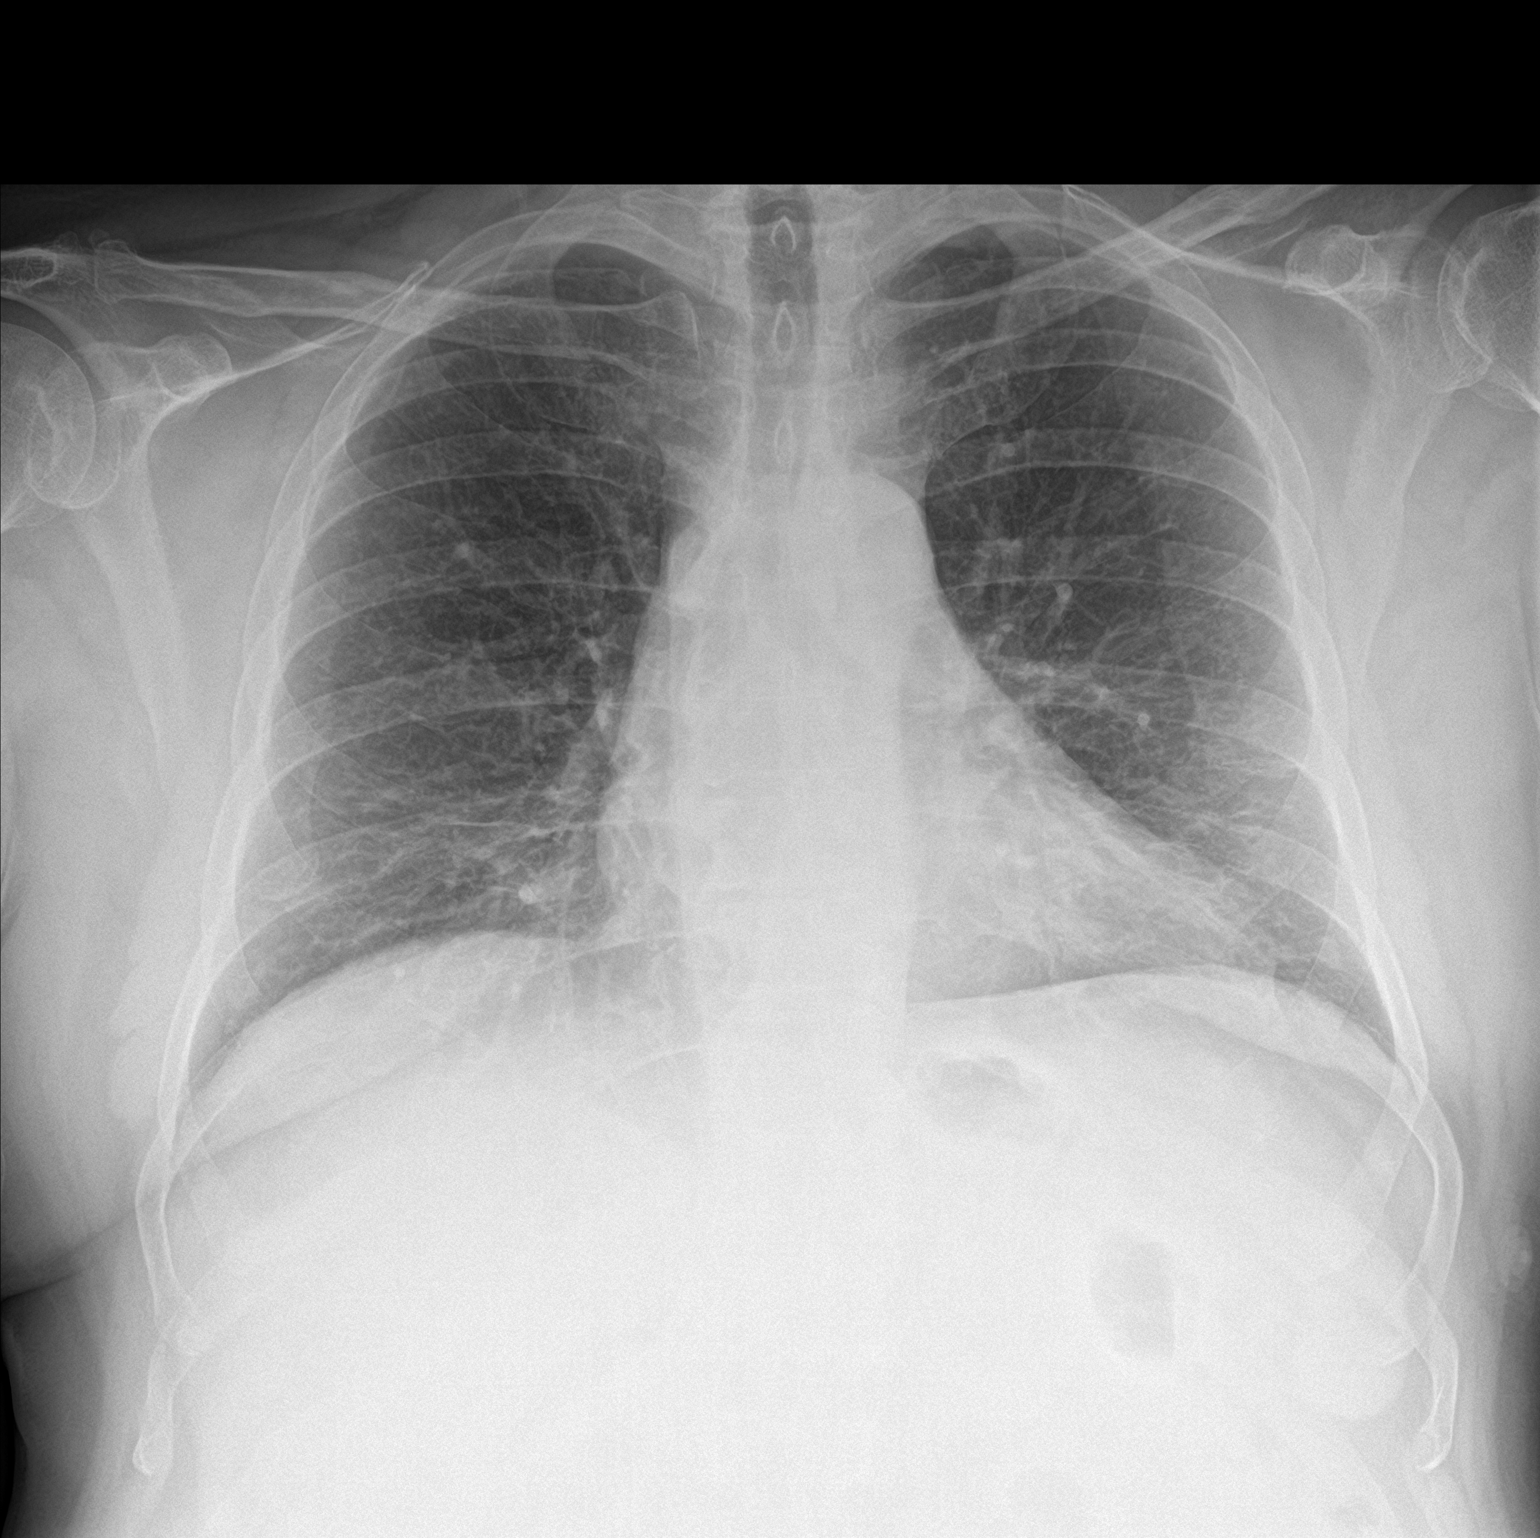

[chest lat]
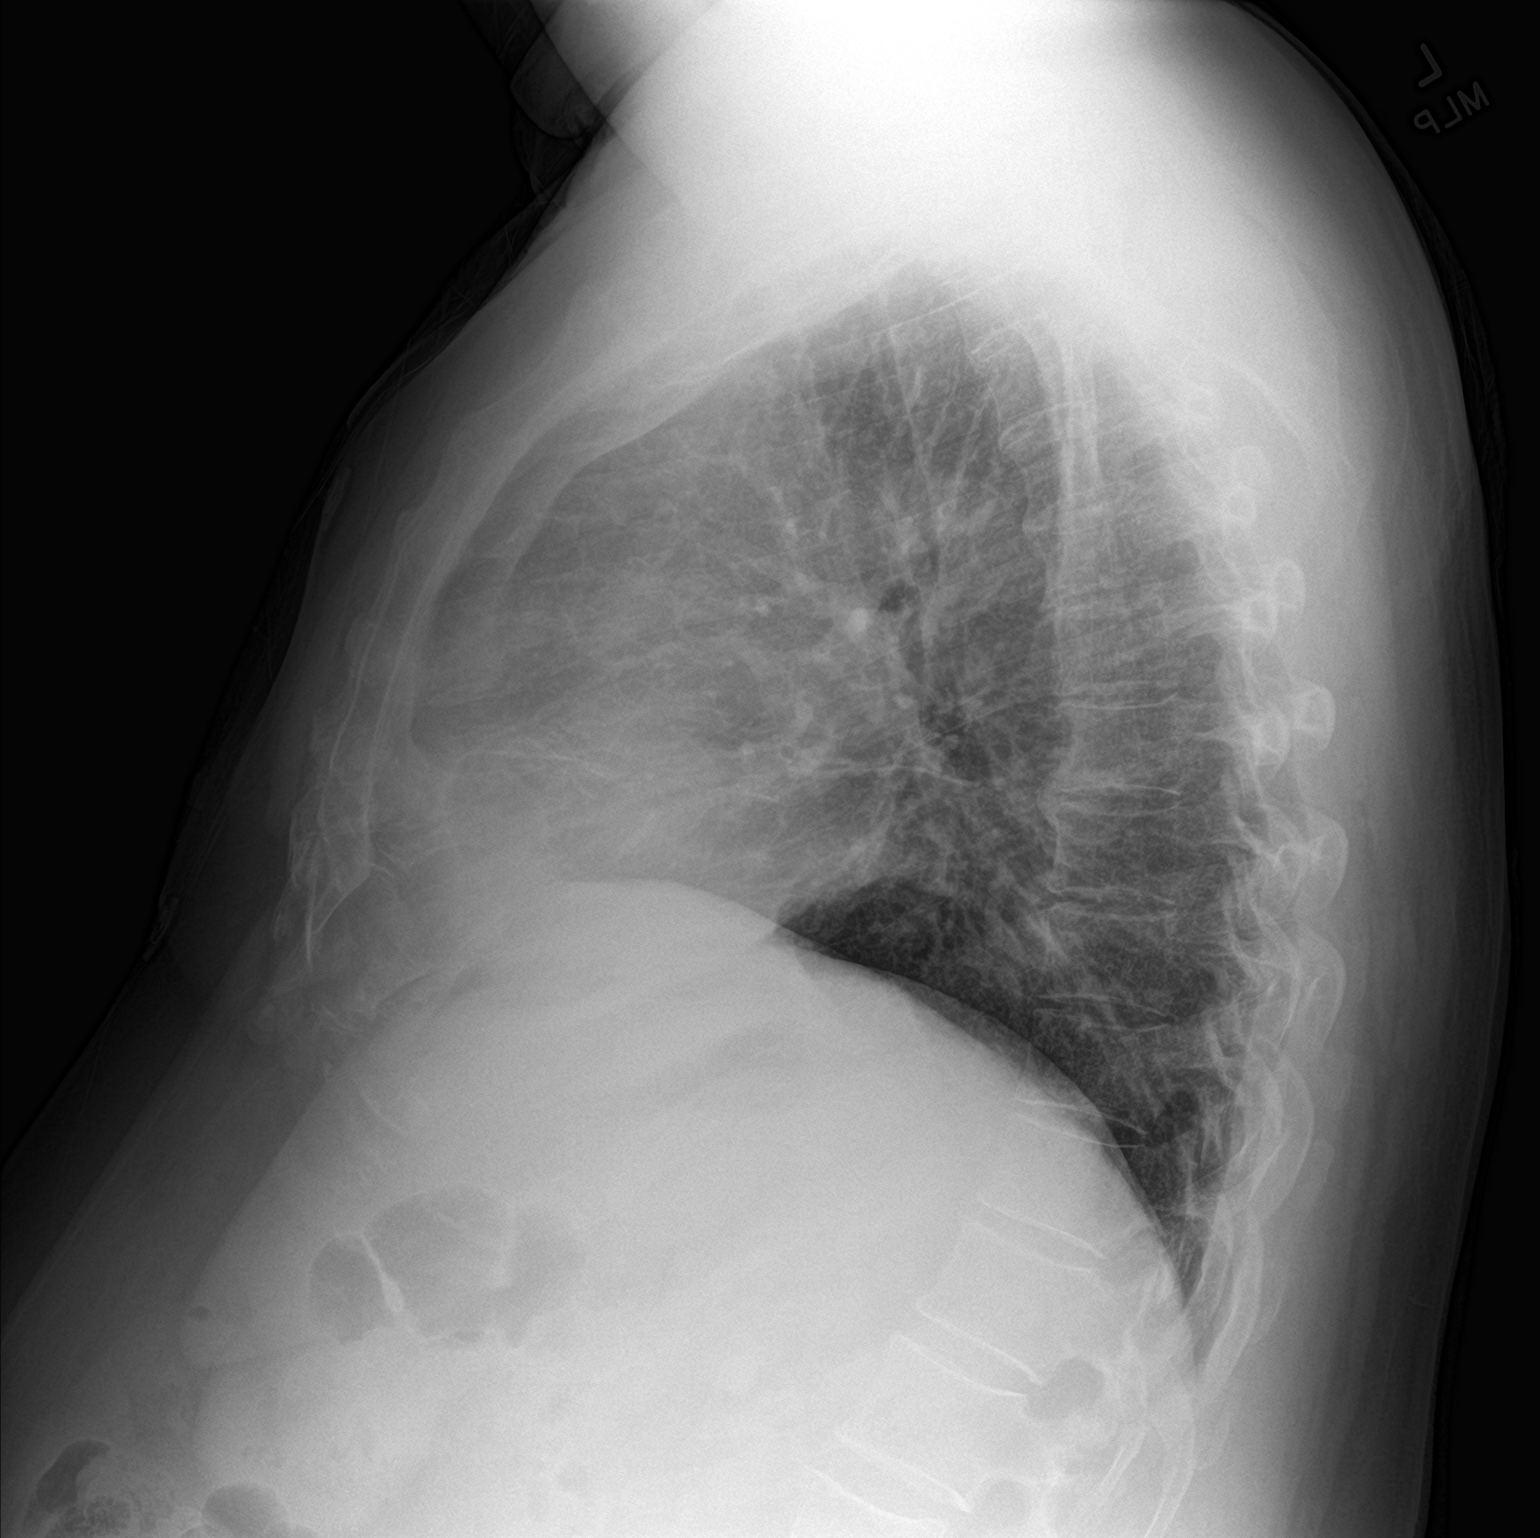

[2 of 2 positions shown; findings below may reference images not displayed]

FINDINGS: Trachea midline. Cardiomediastinal contours and hilar structures are
normal.

Linear opacities present at the lung bases greatest on the LEFT. No
lobar consolidation, pneumothorax or sign of pleural effusion.

On limited assessment no acute skeletal process.
IMPRESSION: Bilateral linear basilar opacities.  Favor atelectasis or scarring.

## 2023-01-24 NOTE — Telephone Encounter (Signed)
Patient states he is doing at home infusions and has questions about this process asking for a call back to go over this process please advise

## 2023-01-24 NOTE — Telephone Encounter (Signed)
Pt LOV was on 12/02/22 Last refill done on 07/29/2022 Please advise

## 2023-01-25 ENCOUNTER — Telehealth: Payer: Self-pay | Admitting: Internal Medicine

## 2023-01-25 NOTE — Telephone Encounter (Signed)
I spoke to [employer] this morning they had tried to reach out to patient to discuss costs and patient assistance for his SCIG but patient wants to wait on therapy at this time. I had spoken to patient at length yesterday regarding same andher was on the fence about starting. He is going to discuss with MD before he makes decision

## 2023-01-25 NOTE — Telephone Encounter (Signed)
Patient called and stated he wants to stop an infusion program. Call back number [phone].

## 2023-01-25 NOTE — Telephone Encounter (Signed)
Tammy-  Thank you for the update.   [facility] staff - can we get him scheduled for a sooner follow up so we can dicuss treatment options. Currently he is booked for may 6th.   Thanks!

## 2023-01-26 NOTE — Telephone Encounter (Signed)
For his LIP (subtype of interstitial lung disase) he should follow with pulmonary.  For now we can hold off on his immunoglobulin replacement therapy (subcu infusions we wanted to start).  He should see me after he sees pulmonary at [facility] as we want to make sure we optimize all of his different disease processes to make sure we maintain his lung function.  In general if he has any sinus infections lung infections I would like to see him.  However for his ILD/L IP he should follow with pulmonary.  Will make sure I work together with pulmonary to optimize his care.

## 2023-01-26 NOTE — Telephone Encounter (Signed)
Dr Silas Flood is sending pt to [place] to see dr Farley Ly due to scan results earliest appt with [place] was march 19th.At this point does he need to wait on everything until he is seen with [place] or come on in and be seen with [place]?  Dr Silas Flood thinks he has LIP

## 2023-01-26 NOTE — Telephone Encounter (Signed)
Lm for pt to call us back 

## 2023-01-27 NOTE — Telephone Encounter (Signed)
Pt informed and stated understanding and will follow up with [place] after [place] visits

## 2023-02-08 DIAGNOSIS — R0602 Shortness of breath: Secondary | ICD-10-CM | POA: Diagnosis not present

## 2023-02-08 DIAGNOSIS — J849 Interstitial pulmonary disease, unspecified: Secondary | ICD-10-CM | POA: Diagnosis not present

## 2023-02-11 DIAGNOSIS — R0602 Shortness of breath: Secondary | ICD-10-CM | POA: Diagnosis not present

## 2023-02-17 ENCOUNTER — Telehealth: Payer: Self-pay | Admitting: Pulmonary Disease

## 2023-02-17 NOTE — Telephone Encounter (Signed)
Dr Silas Flood,  Please review notes in EPIC from [facility] to determine what you would like to do next.  Please advise sir

## 2023-02-21 NOTE — Telephone Encounter (Signed)
Looks like pulmonary function test were normal.  Is the cough any better with the Arnuity?  Lets have him follow-up in the office in the next month or 2.  If the Arnuity is not helping, please let me know we can try a different inhaler in the meantime.  Looks like [facility] will contact him if he needs to follow-up back there.

## 2023-02-21 NOTE — Telephone Encounter (Signed)
Called and left voicemail for patient to call office back   Patient needs to schedule follow up with [place] for 1-2 months as well.

## 2023-02-22 NOTE — Telephone Encounter (Signed)
Pt called the office and appt has been scheduled. Nothing further needed.

## 2023-02-23 ENCOUNTER — Telehealth: Payer: Self-pay | Admitting: Family Medicine

## 2023-02-23 NOTE — Telephone Encounter (Addendum)
Prescription Request  02/23/2023  LOV: 12/02/2022  What is the name of the medication or equipment?  zolpidem (AMBIEN) 10 MG table  Have you contacted your pharmacy to request a refill? Yes   Pt states he has been without this medication for a while now, and has not been able to sleep.  Pt has 5 refills on this Rx, but [facility] is telling him [facility] is refusing to give him a refill.   Pt is asking what can He/We do to get this Rx refilled.  Please advise.  Which pharmacy would you like this sent to?   [id number] [address], [place] Phone: [phone]  Fax: [phone]      Patient notified that their request is being sent to the clinical staff for review and that they should receive a response within 2 business days.   Please advise at Mobile [phone] (mobile)

## 2023-02-23 NOTE — Telephone Encounter (Signed)
Pt PA for Ambien was sent to plan on 02/23/23. Awaiting to hear back of the plans determination in 72 hrs. Pt was notified

## 2023-02-24 ENCOUNTER — Other Ambulatory Visit (HOSPITAL_COMMUNITY): Payer: Self-pay

## 2023-02-25 MED ORDER — TEMAZEPAM 30 MG PO CAPS: 30.0000 mg | ORAL_CAPSULE | Freq: Every evening | ORAL | 5 refills | Status: DC | PRN

## 2023-02-25 NOTE — Addendum Note (Signed)
Addended by: Gershon Crane A on: 02/25/2023 04:42 PM   Modules accepted: Orders

## 2023-02-25 NOTE — Telephone Encounter (Signed)
I sent in Temazepam instead

## 2023-02-25 NOTE — Telephone Encounter (Signed)
Pt is calling to let dr fry know  Michael Bernard has contacted him to let him the medication has been denied. Please advise

## 2023-02-25 NOTE — Telephone Encounter (Signed)
Spoke with pt aware of Dr Clent Ridges advise that he sent in Temazepam instead of Ambien

## 2023-03-01 ENCOUNTER — Encounter: Payer: Self-pay | Admitting: Internal Medicine

## 2023-03-01 ENCOUNTER — Ambulatory Visit: Payer: Medicare HMO | Admitting: Internal Medicine

## 2023-03-01 VITALS — BP 128/76 | HR 61 | Temp 98.2°F | Resp 18 | Wt 220.6 lb

## 2023-03-01 DIAGNOSIS — R9389 Abnormal findings on diagnostic imaging of other specified body structures: Secondary | ICD-10-CM | POA: Diagnosis not present

## 2023-03-01 DIAGNOSIS — D806 Antibody deficiency with near-normal immunoglobulins or with hyperimmunoglobulinemia: Secondary | ICD-10-CM

## 2023-03-01 DIAGNOSIS — R0609 Other forms of dyspnea: Secondary | ICD-10-CM | POA: Diagnosis not present

## 2023-03-01 DIAGNOSIS — K146 Glossodynia: Secondary | ICD-10-CM

## 2023-03-01 DIAGNOSIS — J3089 Other allergic rhinitis: Secondary | ICD-10-CM | POA: Diagnosis not present

## 2023-03-01 NOTE — Progress Notes (Unsigned)
Follow Up Note  RE: Michael Bernard MRN: [id number] DOB: [birth year] Date of Office Visit: 03/01/2023  Referring provider: Nelwyn Salisbury, MD Primary care provider: Nelwyn Salisbury, MD  Chief Complaint: No chief complaint on file.  History of Present Illness: I had the pleasure of seeing Michael Bernard for a follow up visit at the [facility] on 03/01/2023. He is a 67 y.o. male, who is being followed for SAD, persistent asthma, recurrent sinusitis, allergic rhinitis . His previous allergy office visit was on 12/27/2022 with Dr. Marlynn Perking. Today is a regular follow up visit.  History obtained from patient, chart review. At last visit CT returned with multple thin walled cysts and he was referred to pulmonary.  His appointment is scheduled for 12/28/22.    He contacted the clinic while out of town due to increased sinus pressure, tenderness, headaches, rhinnorhea, nasal congestion and malaise.  He was prescribed 7 days of doxycycline and is here for follow up.   Today he reports his sinus symptoms have improved partially but not fully.  This is is second infection since his diagnosis of SAD.  He has continued with Zyrtec daily, Flonase (per nostril daily for comorbid allergic rhinitis.  His PCP started him on pregabalin which has helped his burning mouth syndrome somewhat.  He was scheduled for tongue biopsy in December but it has been postponed due to abnormal chest CT and recurrent infections.  He is open to treatment options for SAD to include SCIG.    For his asthma he denies any dyspnea, wheezing, chest tightness, cough.  Has not needed to use his Arnuity.  He is maintained singular 10 mg daily.    Assessment and Plan: Michael Bernard is a 67 y.o. male with: No diagnosis found.   Plan: There are no Patient Instructions on file for this visit.   No orders of the defined types were placed in this encounter.   Lab Orders  No laboratory test(s) ordered today    Diagnostics: None done    Medication List:  Current Outpatient Medications  Medication Sig Dispense Refill   Fluticasone Furoate (ARNUITY ELLIPTA) 50 MCG/ACT AEPB Inhale 1 puff into the lungs daily. 60 each 5   MECLIZINE HCL PO Take by mouth.     omeprazole (PRILOSEC) 40 MG capsule Take 1 capsule (40 mg total) by mouth daily. 90 capsule 3   pregabalin (LYRICA) 300 MG capsule Take 1 capsule (300 mg total) by mouth at bedtime. 30 capsule 5   temazepam (RESTORIL) 30 MG capsule Take 1 capsule (30 mg total) by mouth at bedtime as needed for sleep. 30 capsule 5   TESSALON PERLES 100 MG capsule Take 1 capsule (100 mg total) by mouth 3 (three) times daily as needed for cough. 20 capsule 0   No current facility-administered medications for this visit.   Allergies: Allergies  Allergen Reactions   Trazodone And Nefazodone Other (See Comments)    Feels hung over the next day    Penicillins Hives, Itching and Rash   I reviewed his past medical history, social history, family history, and environmental history and no significant changes have been reported from his previous visit.  ROS: All others negative except as noted per HPI.   Objective: There were no vitals taken for this visit. There is no height or weight on file to calculate BMI. General Appearance:  Alert, cooperative, no distress, appears stated age  Head:  Normocephalic, without obvious abnormality, atraumatic  Eyes:  Conjunctiva clear, EOM's intact  Nose: Nares normal, normal mucosa, no visible anterior polyps, and septum midline  Throat: Lips, tongue normal; teeth and gums normal, + cobblestoning  Neck: Supple, symmetrical  Lungs:   clear to auscultation bilaterally, Respirations unlabored, no coughing  Heart:  regular rate and rhythm and no murmur, Appears well perfused  Extremities: No edema  Skin: Skin color, texture, turgor normal, no rashes or lesions on visualized portions of skin  Neurologic: No gross deficits    Previous notes and tests were reviewed. The plan was reviewed with the patient/family, and all questions/concerned were addressed.  It was my pleasure to see Michael Bernard today and participate in his care. Please feel free to contact me with any questions or concerns.  Sincerely,  Ferol Luz, MD  Allergy & Immunology  [facility] Office: [phone]

## 2023-03-02 NOTE — Patient Instructions (Signed)
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  Will focus on early antibiotics first.   Dyspnea:  Abnormal Chest CT Follow up with Pulmonary, defer further work up to pulmonary  Given lack of response to ICS, concern for possible cystic COPD agree with stopping arnuity as initial treatment.  Will continue albuterol as needed Singulair continued for allergic rhinitis    PLAN:  - Spacer sample and demonstration provided. - Daily controller medication(s):  None  - Prior to physical activity: albuterol 2 puffs 10-15 minutes before physical activity. - Rescue medications: albuterol 4 puffs every 4-6 hours as needed   Mixed Allergic and nonallergic Rhinitis:   - Testing 08/25/22: postive to  [place] blue grass pollen and [place]le[place] weed pollen, intradermals were negative  -Continue avoidance measures - Continue taking:  Zyrtec 10mg  daily as needed, Flonase 1 spray per nostril as needed for stuffy nose  and Singulair (montelukast) 10mg  daily - You can use an extra dose of the antihistamine, if needed, for breakthrough symptoms.  - Consider nasal saline rinses 1-2 times daily to remove allergens from the nasal cavities as well as help with mucous clearance (this is especially helpful to do before the nasal sprays are given)   Burning Tongue Syndrome  Continue pregabalin negative food testing, no concern from ENT - Discuss titrating pregabalin up given response, but not full control    Follow up: follow-up in clinic in 3 months  Thank you so much for letting me partake in your care today.  Don't hesitate to reach out if you have any additional concerns!  Ferol Luz, MD  [facility] [place] [place]

## 2023-03-07 ENCOUNTER — Other Ambulatory Visit: Payer: Self-pay

## 2023-03-07 NOTE — Telephone Encounter (Signed)
Your information has been submitted to [employer]. [employer] will review the request and will issue a decision, typically within 1-3 days from your submission. You can check the updated outcome later by reopening this request.  If [employer] has not responded in 1-3 days or if you have any questions about your ePA request, please contact [employer] at [phone]. If you think there may be a problem with your PA request, use our live chat feature at the bottom right.

## 2023-03-21 ENCOUNTER — Encounter: Payer: Self-pay | Admitting: Pulmonary Disease

## 2023-03-21 ENCOUNTER — Ambulatory Visit: Payer: Medicare HMO | Admitting: Pulmonary Disease

## 2023-03-21 VITALS — BP 126/80 | HR 64 | Temp 97.8°F | Ht 67.0 in | Wt 220.6 lb

## 2023-03-21 DIAGNOSIS — K146 Glossodynia: Secondary | ICD-10-CM | POA: Diagnosis not present

## 2023-03-21 DIAGNOSIS — J849 Interstitial pulmonary disease, unspecified: Secondary | ICD-10-CM | POA: Diagnosis not present

## 2023-03-21 NOTE — Patient Instructions (Addendum)
Nice to see you again  Your pulmonary function test look good at [facility].  No changes to medicines, I took Arnuity off your list.  Plan to repeat pulmonary function test in about 1 year, CT scan every 2 years or sooner as needed.  Please schedule follow-up appointment with Dr. Maple Hudson here in our office for further evaluation of sleep apnea and insomnia.  Next available.  Return to clinic in 1 year or sooner as needed with Dr. Judeth Horn

## 2023-03-21 NOTE — Progress Notes (Signed)
@Patient  ID: Michael Bernard, male    DOB: [birth year], 67 y.o.   MRN: [id number]  Chief Complaint  Patient presents with   Follow-up    SOB with walking Lethargic    Referring provider: Nelwyn Salisbury, MD  HPI:   67 y.o. man we are seeing for evaluation of shortness of breath, cough as well as cystic-like lesions on CT scan.  Multiple pulmonary notes from [facility] reviewed.  Doing okay.  Met with [facility] pulmonary.  Concern for LI PE which prompted my referral.  He had pulmonary function test performed and on my review interpretation reveals normal PFTs.  At multidisciplinary ILD clinic and they discussed case.  Likely unlikely.  Given stable imaging and symptoms no further follow-up or recommendations.  Okay to follow-up with me.  We discussed at length yearly PFTs to monitor progression of disease.  Likely repeating images every couple of years as well.  Or sooner as needed.  He has ongoing burning mouth.  Pregabalin helps some.  No real answer to this.  We discussed referral to ENT at an academic center which was placed today.  In addition he has issues with insomnia.  Last seen by sleep doctor here in 2020.  Encouraged to reengage with Dr. Maple Hudson for further evaluation of sleep apnea and insomnia.  Current medications seem not to be controlling his sleep well, make him sleepy during the day but not sleeping at night.   HPI at initial visit: Patient describes onset of dyspnea on exertion starting around the time he retired.  Couple years ago.  Started prior to retirement.  Previously, during his work, he walked 10+ miles a day.  Likely even more.  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  But again, for the most of his career he did not have any shortness of breath.  Walked quite extensively, was very active.  Since retirement over the last couple years progressive dyspnea as described.  On exertion.  Worse on  inclines or stairs in particular.  No time of day when things are better or worse.  No position make things better or worse.  No other relieving or exacerbating factors.  Has tried albuterol but does not think it is helped very much.  He does note he had several sinus infections, viral infections over the last few weeks to months which limits his ability to assess the effectiveness.  In addition, he describes a productive cough.  Present for years.  Possibly for a decade or more.  Productive of phlegm.  Worse in the mornings shortly after waking up.  He does endorse nasal congestion.  Denies much rhinorrhea or postnasal drip.  He reports a hiatal hernia.  History of heartburn.  Although does not think his symptoms are very bad..  Recently he was prescribed Arnuity for presumed asthma given productive cough and shortness of breath.  He is yet to start this.  It sounds like his PCP was concerned for possible stroke in 2021.  An outpatient CTA neck as well as CT head was ordered.  On the visible upper portion of the lung, apices and upper portion of bilateral upper lobes reveals scattered cystic findings within the walls.  With ongoing recent workup for shortness of breath by his allergist, a CT chest was ordered which shows scattered thin-walled cystic lesions throughout both in the periphery and central areas otherwise clear lungs on  my review interpretation.  It appears he has a immunodeficiency diagnosed by his allergist.  The feeling is that may be the reason why he has so many recurrent sinus infections etc.  He does endorse history of pneumonia as a child.  Several when he was a child.  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  He describes the  symptoms in detail as well as improvement with pregabalin.  Seems worse throughout the day better in the mornings.  He wears CPAP for OSA and he thinks with his mouth being close while he sleeps the sensation of burning is improved but as he talks and agrees to his mouth during the day the burning worsens.  He denies history of cigarette smoking.  Smokes cigars 1 or so a day for a couple years from time of 26 early 11s.  Has quit and no smoking since then.  PFTs in 2022 fully interpreted below but demonstrate normal versus mild reduction in TLC and DLCO.  He had serial spirometry in the interim which shows decreased FEV1 and FVC.  He had TTE 05/2021 that demonstrates normal valves as well as normal right-sided Cavities and normal estimated PASP.  He had a left heart cath 05/2021 that showed 20% RCA lesion, otherwise no significant lesions, nonobstructive CAD, LVEDP reported at 10.  Questionaires / Pulmonary Flowsheets:   ACT:  Asthma Control Test ACT Total Score  12/27/2022 11:00 AM 13    MMRC:     No data to display           Epworth:     11/07/2017    1:00 PM  Results of the Epworth flowsheet  Sitting and reading 3  Watching TV 3  Sitting, inactive in a public place (e.g. a theatre or a meeting) 3  As a passenger in a car for an hour without a break 0  Lying down to rest in the afternoon when circumstances permit 3  Sitting and talking to someone 0  Sitting quietly after a lunch without alcohol 0  In a car, while stopped for a few minutes in traffic 0  Total score 12    Tests:   FENO:  No results found for: "NITRICOXIDE"  PFT:    Latest Ref Rng & Units 02/07/2018    9:59 AM  PFT Results  FVC-Pre L 3.53   FVC-Predicted Pre % 79   FVC-Post L 3.49   FVC-Predicted Post % 78   Pre FEV1/FVC % % 83   Post FEV1/FCV % % 83   FEV1-Pre L 2.92   FEV1-Predicted Pre % 87   FEV1-Post L 2.90   DLCO uncorrected ml/min/mmHg 23.00   DLCO [facility]% % 77   DLVA Predicted % 100   TLC L  5.09   TLC % Predicted % 77   RV % Predicted % 75   Prior PFT 2018 personally reviewed and interpreted as spirometry just of moderate striction versus air trapping, no bronchodilator response, lung volumes not reported low low normal but TLC is 77% of predicted, mildly reduced to right lower limit of normal likely, DLCO similarly 77% predicted, suspect normal versus mildly reduced.  Given mild reduction in TLC and DLCO, this likely reflects underlying mild interstitial lung  disease.  WALK:      No data to display           Imaging: No results found.  Lab Results:  CBC    Component Value Date/Time   WBC 7.6 12/02/2022 0841   RBC 5.43 12/02/2022 0841   HGB 17.5 (H) 12/02/2022 0841   HCT 50.6 12/02/2022 0841   PLT 353.0 12/02/2022 0841   MCV 93.3 12/02/2022 0841   MCH 31.1 05/21/2021 2337   MCHC 34.5 12/02/2022 0841   RDW 13.2 12/02/2022 0841   LYMPHSABS 1.6 12/02/2022 0841   MONOABS 0.7 12/02/2022 0841   EOSABS 0.2 12/02/2022 0841   BASOSABS 0.1 12/02/2022 0841    BMET    Component Value Date/Time   NA 140 12/02/2022 0841   K 4.4 12/02/2022 0841   CL 102 12/02/2022 0841   CO2 29 12/02/2022 0841   GLUCOSE 88 12/02/2022 0841   BUN 10 12/02/2022 0841   CREATININE 1.06 12/02/2022 0841   CREATININE 0.97 10/14/2020 1141   CALCIUM 9.9 12/02/2022 0841   GFRNONAA >60 05/22/2021 0233   GFRNONAA 83 10/14/2020 1141   GFRAA 96 10/14/2020 1141    BNP No results found for: "BNP"  ProBNP No results found for: "PROBNP"  Specialty Problems       Pulmonary Problems   Dyspnea on exertion    PFT 02/07/18- Mild restriction, mild Diffusion reduction, FVC 3.49/78%, FEV1 2.90/86%, ratio 0.83, FEF 25-75% 3.01/108%, No R to BD, TLC 77%, DLCO 77%      Obstructive sleep apnea    HST 12/14/17-AHI 37.9/hour, desaturation to 68%, body weight 225 pounds      Seasonal and perennial allergic rhinitis    Remote allergy skin testing positive for dust mite and other common  environmental allergens.       Allergies  Allergen Reactions   Trazodone And Nefazodone Other (See Comments)    Feels hung over the next day    Penicillins Hives, Itching and Rash    Immunization History  Administered Date(s) Administered   Influenza,inj,Quad PF,6+ Mos 08/24/2017, 08/31/2018   Influenza-Unspecified 10/07/2020   PFIZER(Purple Top)SARS-COV-2 Vaccination 02/05/2020, 03/11/2020, 10/23/2020    Past Medical History:  Diagnosis Date   Anxiety    situational anxiety   Arthritis    LEFT knee   Asthma    Cancer (HCC), Melanoma    skin x 3-4 times   GERD (gastroesophageal reflux disease)    on meds   Hiatal hernia    Kidney stones    Multiple gastric ulcers 2015   seen on endoscopy per Dr. Chales Abrahams in [place]    Seasonal allergies    Sleep apnea    sees Dr. Fannie Knee, uses CPAP     Tobacco History: Social History   Tobacco Use  Smoking Status Former   Years: 3   Types: Cigarettes   Quit date: 06/11/1979   Years since quitting: 43.8  Smokeless Tobacco Never  Tobacco Comments   pt reports smoker 0.5 pack/month as a teenager 06/11/2019   Counseling given: Not Answered Tobacco comments: pt reports smoker 0.5 pack/month as a teenager 06/11/2019   Continue to not smoke  Outpatient Encounter Medications as of 03/21/2023  Medication Sig   MECLIZINE HCL PO Take by mouth.   omeprazole (PRILOSEC) 40 MG capsule Take 1 capsule (40 mg total) by mouth daily.   pregabalin (LYRICA) 300 MG capsule Take 1 capsule (300 mg total) by mouth at bedtime.   temazepam (RESTORIL) 30 MG capsule Take  1 capsule (30 mg total) by mouth at bedtime as needed for sleep.   TESSALON PERLES 100 MG capsule Take 1 capsule (100 mg total) by mouth 3 (three) times daily as needed for cough.   [DISCONTINUED] Fluticasone Furoate (ARNUITY ELLIPTA) 50 MCG/ACT AEPB Inhale 1 puff into the lungs daily.   No facility-administered encounter medications on file as of 03/21/2023.     Review of  Systems  Review of Systems  N/a Physical Exam  BP 126/80 (BP Location: Left Arm, Patient Position: Sitting, Cuff Size: Normal)   Pulse 64   Temp 97.8 F (36.6 C) (Oral)   Ht 5\' 7"  (1.702 m)   Wt 220 lb 9.6 oz (100.1 kg)   SpO2 97%   BMI 34.55 kg/m   Wt Readings from Last 5 Encounters:  03/21/23 220 lb 9.6 oz (100.1 kg)  03/01/23 220 lb 9.6 oz (100.1 kg)  12/28/22 223 lb (101.2 kg)  12/27/22 223 lb 8 oz (101.4 kg)  12/02/22 219 lb 6.4 oz (99.5 kg)    BMI Readings from Last 5 Encounters:  03/21/23 34.55 kg/m  03/01/23 33.54 kg/m  12/28/22 33.91 kg/m  12/27/22 33.98 kg/m  12/02/22 33.36 kg/m     Physical Exam General: Sitting in chair, no acute distress Eyes: EOMI, no icterus Neck: Supple, no JVP Pulmonary: Clear, normal work of breathing Cardiovascular: Warm, no edema, regular rhythm Abdomen: Nondistended, bowel sounds present MSK: No synovitis , no joint effusion Neuro: Normal gait, no weakness Psych: Normal mood, full affect   Assessment & Plan:   Chronic cough: Present for years, suspect decade or more.  Will be hard to control.  Likely combination of upper airway cough syndrome given his description of nasal congestion as well as hiatal hernia and history of reflux.  Possible contribution of asthma given shortness of breath and productive cough.  Cough is spasmodic on exam with deep inspiration, suspicious for bronchospasm.  Not improved with Arnuity, given underlying immunodeficiency would not move forward with additional ICS.  This was discontinued today.  He is taking pregabalin for burning mouth, to continue.  Cystic lung disease: Likely effect of underlying ILD.  Given his history of pneumonias and recurrent infections pneumatoceles are considered.  There are thin-walled cyst.  He has burning mouth.  Do wonder if this is a manifestation of underlying Sjogren's although review of literature indicates not a clear connection.  With a thin-walled cysts,  burning mouth, as well as report of her diagnosis of immunodeficiency per immunologist of specific antibody deficiency with normal immunoglobulin concentration and normal number of B cells, suspicion for LIP relatively high even though epidemiologically it is rare.  Other considerations include Langerhans but given lack of smoking history this felt less likely.  Bobetta Lime Dubay considered but given other history felt less likely, no characteristic skin manifestations as well.  Seen by [facility] in the interim in 2024, PFTs normal, imaging stable, ultimately agree with clinical diagnosis of LIP. given stability disease, no further recommendations made.  Okay to follow-up here locally.  Discussed yearly PFTs and every couple of years CT scans to assess for stability disease versus progression.  Or sooner as needed based on symptoms.  He expressed understanding.  Can always reengage academic center in the future if things worsen.  Return in about 1 year (around 03/20/2024) for f/u Dr. Judeth Horn.   Karren Burly, MD 03/21/2023   This appointment required 40 minutes of patient care (this includes precharting, chart review, review of results, face-to-face  care, etc.).

## 2023-03-28 ENCOUNTER — Ambulatory Visit: Payer: Medicare HMO | Admitting: Internal Medicine

## 2023-04-05 ENCOUNTER — Ambulatory Visit (INDEPENDENT_AMBULATORY_CARE_PROVIDER_SITE_OTHER): Payer: Medicare HMO | Admitting: Family Medicine

## 2023-04-05 ENCOUNTER — Encounter: Payer: Self-pay | Admitting: Family Medicine

## 2023-04-05 ENCOUNTER — Encounter: Payer: Medicare HMO | Admitting: Family Medicine

## 2023-04-05 VITALS — BP 124/72 | HR 64 | Temp 98.1°F | Ht 67.0 in | Wt 221.0 lb

## 2023-04-05 DIAGNOSIS — K146 Glossodynia: Secondary | ICD-10-CM

## 2023-04-05 DIAGNOSIS — F5101 Primary insomnia: Secondary | ICD-10-CM | POA: Diagnosis not present

## 2023-04-05 DIAGNOSIS — J849 Interstitial pulmonary disease, unspecified: Secondary | ICD-10-CM | POA: Diagnosis not present

## 2023-04-05 NOTE — Progress Notes (Signed)
   Subjective:    Patient ID: Michael Bernard, male    DOB: July 16, 1956, 67 y.o.   MRN: [id number]  HPI Here to follow up on insomnia and burning mouth. For sleep he says he had the best results from Zolpidem of any medication he has tried, but his insurance will only cover this for 90 days out of the year and this is too expensive for him to pay cash for it. He is currently taking Temazepam and this helps a little. We has also been treating the burning mouth with Pregabalin, and this helps him sleep to a degree. He is seeing Dr. Judeth Horn for the ILD, and he has referred [name] to [facility] ENT in [place] for the burning mouth. He has also been referred back to see Dr. Jetty Duhamel for his sleep apnea.   Review of Systems  Constitutional: Negative.   HENT:  Negative for congestion, postnasal drip, sinus pressure and sore throat.   Eyes: Negative.   Respiratory:  Positive for cough and shortness of breath. Negative for wheezing.   Psychiatric/Behavioral:  Positive for sleep disturbance. Negative for decreased concentration. The patient is not nervous/anxious.        Objective:   Physical Exam Constitutional:      Appearance: Normal appearance.  Cardiovascular:     Rate and Rhythm: Normal rate and regular rhythm.     Pulses: Normal pulses.     Heart sounds: Normal heart sounds.  Pulmonary:     Effort: Pulmonary effort is normal.     Breath sounds: Normal breath sounds.  Neurological:     General: No focal deficit present.     Mental Status: He is alert and oriented to person, place, and time.           Assessment & Plan:  For the burning mouth, he will stay on the current regimen and he will see ENT as above. For the ILD, he will see Dr. Judeth Horn once a year. For the insomnia, he will stay on the current regimen.  Gershon Crane, MD

## 2023-04-16 DIAGNOSIS — S91331A Puncture wound without foreign body, right foot, initial encounter: Secondary | ICD-10-CM | POA: Diagnosis not present

## 2023-04-16 DIAGNOSIS — W228XXA Striking against or struck by other objects, initial encounter: Secondary | ICD-10-CM | POA: Diagnosis not present

## 2023-04-16 DIAGNOSIS — M7989 Other specified soft tissue disorders: Secondary | ICD-10-CM | POA: Diagnosis not present

## 2023-04-16 DIAGNOSIS — Z23 Encounter for immunization: Secondary | ICD-10-CM | POA: Diagnosis not present

## 2023-04-20 ENCOUNTER — Telehealth: Payer: Self-pay | Admitting: *Deleted

## 2023-04-20 NOTE — Telephone Encounter (Signed)
Transition Care Management Unsuccessful Follow-up Telephone Call  Date of discharge and from where:  [facility]   Attempts:  1st Attempt  Reason for unsuccessful TCM follow-up call: patient declined follow up call

## 2023-04-27 ENCOUNTER — Encounter: Payer: Self-pay | Admitting: Gastroenterology

## 2023-04-29 ENCOUNTER — Institutional Professional Consult (permissible substitution): Payer: Medicare HMO | Admitting: Internal Medicine

## 2023-05-03 ENCOUNTER — Encounter: Payer: Self-pay | Admitting: Pulmonary Disease

## 2023-05-03 NOTE — Telephone Encounter (Signed)
See other encounter.  Message was sent to patient.

## 2023-05-05 NOTE — Telephone Encounter (Signed)
PCC's can you resend ENT referral? Per referral is was sent 03/21/23 to   ENT/Head and Neck Surgery [facility] Address   [address], [place] [address]   Get Directions Appointments   [phone])

## 2023-05-06 NOTE — Telephone Encounter (Signed)
Pt returning missed call. 

## 2023-05-06 NOTE — Telephone Encounter (Addendum)
Previous referral was just faxed to [facility] ENT office in [place].  Their office requires referring office to call them for appt instead of just faxing referral.  I called them and got pt an appt in their [place] office which had an earlier appt open.  I have called pt & left him a vm to call me for appt info.  Will route to triage so pt can be made aware thru MyChart to call me as well at [phone] for appt info.

## 2023-05-29 NOTE — Progress Notes (Addendum)
HPI  male former smoker for sleep evaluation.referred by Dr Clent Ridges for ? OSA, wakes up during the night. HST 12/14/17-AHI 37.9/hour, desaturation to 68%, body weight 225 pounds PFT 02/07/18- Mild restriction, mild Diffusion reduction, FVC 3.49/78%, FEV1 2.90/86%, ratio 0.83, FEF 25-75% 3.01/108%, No R to BD, TLC 77%, DLCO 77% ------------------------------------------------------------------------------------  06/11/2019- 67 year old male former smoker followed for OSA, insomnia, asthmatic bronchitis CPAP auto 5-20/[employer] begun 01/02/18 Download compliance 57%, AHI 0.1/ hr -----OSA on CPAP auto 5-20, DME: [employer]; pt states CPAP is working well for him, however, he still has trouble sleeping, states he goes to bed at 9 AM and gets up at 2 AM Body weight today- 224 lbs He reports hs about 9, wakes around 2AM. Sleep pften disturbed by grandchild coming in. If he stays in bed after waking gets "busy brain" and can't go back to sleep.  [place] sinuses- has meds if needed.   05/30/23- 66 yom former smoker followed for OSA, Insomnia Followed by Dr Judeth Horn and [facility] for DOE with ILD/ fibrocystic lung disease/ Lymphocytic Interstitial Pneumonia , chronic Stomatitis, allergic Rhinitis -Temazepam 30,  CPAP auto 5-20/ [employer]   new 2019 Download compliance- 70%, AHI 0.7/ hr Body weight today-218 lbs Complains he falls asleep easily but wakes after 4 hours. Weakness, lethargy, easy DOE.  Tried zolpidem and temazepam 30 every night- not sufficient "Busy Brain".No sleep med now. Bedtime 9:30 PM, latency 30 minutes, WASO 2-3 times, then gets up bet 3:00 and 5:00 AM. Now finds pregablin helps sleep and mouth some. Also using an otc "Dreams powder" with melatonin. Mouth burning oer the past year- to see another ENT soon. Download reviewed. Good control with auto 4-6. We are going to start by trying Lunesta and replacing old CPAP. He may need other intervention, possibly CBT, for "busy  brain".   ROS-see HPI   + = positive Constitutional:    weight loss, night sweats, fevers, chills, fatigue, lassitude. HEENT:    headaches, difficulty swallowing, tooth/dental problems, sore throat,       sneezing, itching, ear ache, +nasal congestion, post nasal drip, snoring CV:    chest pain, orthopnea, PND, swelling in lower extremities, anasarca,                                                       dizziness, palpitations Resp:  + shortness of breath with exertion or at rest.               + productive cough,   non-productive cough, coughing up of blood.              change in color of mucus.  wheezing.   Skin:    rash or lesions. GI:     heartburn,+ indigestion, abdominal pain, nausea, vomiting, diarrhea,                 change in bowel habits, loss of appetite GU: dysuria, change in color of urine, no urgency or frequency.   flank pain. MS:   joint pain, stiffness, decreased range of motion, back pain. Neuro-     nothing unusual Psych:  change in mood or affect.  depression or anxiety.   memory loss.   OBJ- Physical Exam General- Alert, Oriented, Affect-appropriate, Distress- none acute,  + overweight Skin- rash-none,  lesions- none, excoriation- none Lymphadenopathy- none Head- atraumatic            Eyes- Gross vision intact, PERRLA, conjunctivae and secretions clear            Ears- Hearing, canals-normal            Nose- Clear, no-Septal dev, mucus, polyps, erosion, perforation             Throat- Mallampati III , mucosa clear , drainage- none, tonsils- atrophic,  Neck- flexible , trachea midline, no stridor , thyroid nl, carotid no bruit Chest - symmetrical excursion , unlabored           Heart/CV- RRR , no murmur , no gallop  , no rub, nl s1 s2                           - JVD- none , edema- none, stasis changes- none, varices- none           Lung-clear, cough-none, dullness-none, rub- none           Chest wall-  Abd-  Br/ Gen/ Rectal- Not done, not indicated Extrem-  cyanosis- none, clubbing, none, atrophy- none, strength- nl Neuro- grossly intact to observation

## 2023-05-30 ENCOUNTER — Encounter: Payer: Self-pay | Admitting: Internal Medicine

## 2023-05-30 ENCOUNTER — Ambulatory Visit (INDEPENDENT_AMBULATORY_CARE_PROVIDER_SITE_OTHER): Payer: Medicare HMO | Admitting: Internal Medicine

## 2023-05-30 VITALS — BP 114/68 | HR 74 | Ht 67.0 in | Wt 218.4 lb

## 2023-05-30 DIAGNOSIS — K146 Glossodynia: Secondary | ICD-10-CM | POA: Diagnosis not present

## 2023-05-30 DIAGNOSIS — G4733 Obstructive sleep apnea (adult) (pediatric): Secondary | ICD-10-CM

## 2023-05-30 DIAGNOSIS — J849 Interstitial pulmonary disease, unspecified: Secondary | ICD-10-CM

## 2023-05-30 MED ORDER — ESZOPICLONE 3 MG PO TABS: 3.0000 mg | ORAL_TABLET | Freq: Every day | ORAL | 2 refills | Status: DC

## 2023-05-30 NOTE — Patient Instructions (Signed)
Order- DME [facility]- please replace old CPAP machine, auto 5-20, mask of choice, humidifier, supplies, AirView/ card  Script sent to try Lunesta 3 mg, 1 at bedtime for sleep as discussed

## 2023-05-31 ENCOUNTER — Encounter: Payer: Self-pay | Admitting: Internal Medicine

## 2023-05-31 ENCOUNTER — Ambulatory Visit: Payer: Medicare HMO | Admitting: Internal Medicine

## 2023-05-31 VITALS — BP 112/80 | HR 62 | Temp 98.2°F | Resp 16

## 2023-05-31 DIAGNOSIS — J3089 Other allergic rhinitis: Secondary | ICD-10-CM | POA: Diagnosis not present

## 2023-05-31 DIAGNOSIS — R053 Chronic cough: Secondary | ICD-10-CM | POA: Diagnosis not present

## 2023-05-31 DIAGNOSIS — J842 Lymphoid interstitial pneumonia: Secondary | ICD-10-CM

## 2023-05-31 DIAGNOSIS — G4700 Insomnia, unspecified: Secondary | ICD-10-CM | POA: Diagnosis not present

## 2023-05-31 DIAGNOSIS — K146 Glossodynia: Secondary | ICD-10-CM

## 2023-05-31 DIAGNOSIS — D806 Antibody deficiency with near-normal immunoglobulins or with hyperimmunoglobulinemia: Secondary | ICD-10-CM | POA: Diagnosis not present

## 2023-05-31 NOTE — Patient Instructions (Addendum)
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  Will focus on early antibiotics first.   Dyspnea:  LIP Follow up with Pulmonary, defer further work up to pulmonary Will continue albuterol as needed Singulair continued for allergic rhinitis     Mixed Allergic and nonallergic Rhinitis:   - Testing 08/25/22: postive to  [place] blue grass pollen and cocklebur weed pollen, intradermals were negative  -Continue avoidance measures - Continue taking:  Zyrtec 10mg  daily as needed, Flonase 1 spray per nostril as needed for stuffy nose  and Singulair (montelukast) 10mg  daily - You can use an extra dose of the antihistamine, if needed, for breakthrough symptoms.  - Consider nasal saline rinses 1-2 times daily to remove allergens from the nasal cavities as well as help with mucous clearance (this is especially helpful to do before the nasal sprays are given)   Burning Tongue Syndrome  Continue pregabalin negative food testing, no concern from ENT - Follow up with academic ENT   Fatigue  - Start sleep medications as prescribed from Pulmonary   Follow up: follow-up in clinic in 6 months or if you need antibiotics   Thank you so much for letting me partake in your care today.  Don't hesitate to reach out if you have any additional concerns!  Ferol Luz, MD  [facility] [place], [place]

## 2023-05-31 NOTE — Progress Notes (Signed)
Follow Up Note  RE: Michael Bernard MRN: [id number] DOB: Apr 05, 1956 Date of Office Visit: 05/31/2023  Referring provider: Nelwyn Salisbury, MD Primary care provider: Nelwyn Salisbury, MD  Chief Complaint: No chief complaint on file.  History of Present Illness: I had the pleasure of seeing Michael Bernard for a follow up visit at the [facility] on 06/03/2023. He is a 67 y.o. male, who is being followed for SAD, persistent asthma, recurrent sinusitis, allergic rhinitis . His previous allergy office visit was on4/9/24  with Dr. Marlynn Perking. Today is a regular follow up visit.  History obtained from patient, chart review.   Since last visit he was seen his case was discussed in multi D conference at [facility] pulmonary and ultimate diagnosis is LI P.  They believe this is stable and he will follow annually with CTs with them annually.  He saw Dr. Judeth Horn at [facility] pulmonary who referred him to ENT at academic center for ongoing burning mouth syndrome.  And he was recommended reengage with his sleep doctor. Arnuity was discontinued at that time as cough was thought to be more upper airway and concern for ICS affects given SAD.   Today he reports  No infections since last visit.  No change in respiratory symptoms or cough since stopping Arnuity.  His main issue right now is lack of sleep due to burning tongue syndrome and insomnia.  He was seen by a sleep physician and sleep medications adjusted however due to insurance issues he has not been able to pick it up.  His wife has been in the hospital over the past week for sepsis and has been very stressed and not sleeping because of this.  His pulmonologist has referred him to [facility] ENT for an academic evaluation of his burning tongue syndrome otherwise he has no changes in health history or medications.    [facility] note 03/01/23: "I contacted Mr. Poitras regarding multidisciplinary discussion and lab results. CT scan is most c/w LIP. His  workup is negative for CTD's that are most likely to cause LIP. We did discuss a diagnosis of amyloid, but given that he does not have an elevated gamma-gap, I think that is less likely. Workup for other causes of his weakness -- including neuromuscular causes -- was also unrevealing. Though he does not have prior CT scan in our system for comparison, per Dr. Laurena Spies note, he did have a prior CT scan from 2021 demonstrating stable cysts. Given this, and his normal PFTs, recommendation is for annual monitoring at this time. He will continue to follow with Dr. Judeth Horn, with whom he has an appointment scheduled later this month. All questions were answered, and they expressed understanding of and agreement with this plan. "     Assessment and Plan: Michael Bernard is a 67 y.o. 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  Will focus on early antibiotics first.   Dyspnea:  LIP Follow up with Pulmonary, defer further work up to pulmonary Will continue albuterol as needed Singulair continued for allergic rhinitis     Mixed Allergic and nonallergic Rhinitis:   - Testing 08/25/22: postive to  [place] blue grass pollen and cocklebur weed  pollen, intradermals were negative  -Continue avoidance measures - Continue taking:  Zyrtec 10mg  daily as needed, Flonase 1 spray per nostril as needed for stuffy nose  and Singulair (montelukast) 10mg  daily - You can use an extra dose of the antihistamine, if needed, for breakthrough symptoms.  - Consider nasal saline rinses 1-2 times daily to remove allergens from the nasal cavities as well as help with mucous clearance (this is especially helpful to do before  the nasal sprays are given)   Burning Tongue Syndrome  Continue pregabalin negative food testing, no concern from ENT - Follow up with academic ENT   Fatigue  - Start sleep medications as prescribed from Pulmonary   Follow up: follow-up in clinic in 6 months or if you need antibiotics   Thank you so much for letting me partake in your care today.  Don't hesitate to reach out if you have any additional concerns!  Ferol Luz, MD  [facility] [place], [place]   No orders of the defined types were placed in this encounter.   Lab Orders  No laboratory test(s) ordered today   Diagnostics: None done    Medication List:  Current Outpatient Medications  Medication Sig Dispense Refill   albuterol (VENTOLIN HFA) 108 (90 Base) MCG/ACT inhaler Inhale into the lungs every 6 (six) hours as needed for wheezing or shortness of breath.     Azelastine-Fluticasone (DYMISTA) 137-50 MCG/ACT SUSP Place into the nose.     Eszopiclone 3 MG TABS Take 1 tablet (3 mg total) by mouth at bedtime. Take immediately before bedtime 30 tablet 2   MECLIZINE HCL PO Take by mouth.     omeprazole (PRILOSEC) 40 MG capsule Take 1 capsule (40 mg total) by mouth daily. 90 capsule 3   pregabalin (LYRICA) 300 MG capsule Take 1 capsule (300 mg total) by mouth at bedtime. 30 capsule 5   TESSALON PERLES 100 MG capsule Take 1 capsule (100 mg total) by mouth 3 (three) times daily as needed for cough. 20 capsule 0   temazepam (RESTORIL) 30 MG capsule Take 1 capsule (30 mg total) by mouth at bedtime as needed for sleep. (Patient not taking: Reported on 05/31/2023) 30 capsule 5   No current facility-administered medications for this visit.   Allergies: Allergies  Allergen Reactions   Trazodone And Nefazodone Other (See Comments)    Feels hung over the next day    I reviewed his past medical history, social history, family history, and environmental history and no significant changes have been reported  from his previous visit.  ROS: All others negative except as noted per HPI.   Objective: BP 112/80   Pulse 62   Temp 98.2 F (36.8 C) (Temporal)   Resp 16   SpO2 97%  There is no height or weight on file to calculate BMI. General Appearance:  Tired appearing, cooperative, no distress, appears stated age  Head:  Normocephalic, without obvious abnormality, atraumatic  Eyes:  Conjunctiva clear, EOM's intact  Nose: Nares normal, normal mucosa, no visible anterior polyps, and septum midline  Throat: Lips, tongue normal; teeth and gums normal, + cobblestoning  Neck: Supple, symmetrical  Lungs:   clear to auscultation bilaterally, Respirations unlabored, no coughing  Heart:  regular rate and rhythm and no murmur, Appears well perfused  Extremities: No edema  Skin: Skin color, texture, turgor normal, no rashes or lesions on visualized portions of skin  Neurologic: No gross deficits   Previous notes and tests were  reviewed. The plan was reviewed with the patient/family, and all questions/concerned were addressed.  It was my pleasure to see Riel today and participate in his care. Please feel free to contact me with any questions or concerns.  Sincerely,  Ferol Luz, MD  Allergy & Immunology  [facility] Office: [phone]

## 2023-06-04 ENCOUNTER — Other Ambulatory Visit: Payer: Self-pay | Admitting: Family Medicine

## 2023-06-06 NOTE — Telephone Encounter (Signed)
Pt is calling back about       pregabalin (LYRICA) 300 MG capsule he stated he will be out tomorrow.

## 2023-06-06 NOTE — Telephone Encounter (Signed)
Pt LOV was on 12/02/22 Last refill was done on 12/02/22 Please advise

## 2023-06-08 ENCOUNTER — Telehealth: Payer: Self-pay | Admitting: Internal Medicine

## 2023-06-13 ENCOUNTER — Encounter: Payer: Self-pay | Admitting: Internal Medicine

## 2023-06-13 DIAGNOSIS — K145 Plicated tongue: Secondary | ICD-10-CM | POA: Diagnosis not present

## 2023-06-13 DIAGNOSIS — K1379 Other lesions of oral mucosa: Secondary | ICD-10-CM | POA: Diagnosis not present

## 2023-06-13 NOTE — Assessment & Plan Note (Signed)
We are going to replace old CPAP machine and work on sleep hygiene

## 2023-06-13 NOTE — Assessment & Plan Note (Signed)
Followed by Dr Judeth Horn and [facility]

## 2023-06-13 NOTE — Assessment & Plan Note (Signed)
Seeking another ENT opinion.

## 2023-06-15 NOTE — Telephone Encounter (Signed)
Michael Bernard, [employer] is requesting Dr. Maple Hudson to make an addendum to 05/30/23 OV note.  OV note states patient cpap compliance is 67% and Medicare requires 70% cpap usage compliance.    Message routed to Dr. Maple Hudson to advise

## 2023-06-15 NOTE — Telephone Encounter (Signed)
Called and [name], [employer].  [name] made aware of Dr. Roxy Cedar OV note from 05/30/23.  [name] will pull OV note epic.  Nothing further at this time.

## 2023-06-15 NOTE — Telephone Encounter (Signed)
Office note corrected- please resend

## 2023-07-04 DIAGNOSIS — K145 Plicated tongue: Secondary | ICD-10-CM | POA: Diagnosis not present

## 2023-07-04 DIAGNOSIS — R208 Other disturbances of skin sensation: Secondary | ICD-10-CM | POA: Diagnosis not present

## 2023-07-04 DIAGNOSIS — R0981 Nasal congestion: Secondary | ICD-10-CM | POA: Diagnosis not present

## 2023-07-05 DIAGNOSIS — G4733 Obstructive sleep apnea (adult) (pediatric): Secondary | ICD-10-CM | POA: Diagnosis not present

## 2023-08-01 DIAGNOSIS — K145 Plicated tongue: Secondary | ICD-10-CM | POA: Diagnosis not present

## 2023-08-01 DIAGNOSIS — K1379 Other lesions of oral mucosa: Secondary | ICD-10-CM | POA: Diagnosis not present

## 2023-08-05 DIAGNOSIS — G4733 Obstructive sleep apnea (adult) (pediatric): Secondary | ICD-10-CM | POA: Diagnosis not present

## 2023-08-08 ENCOUNTER — Other Ambulatory Visit: Payer: Self-pay | Admitting: Internal Medicine

## 2023-08-08 ENCOUNTER — Telehealth: Payer: Self-pay

## 2023-08-08 MED ORDER — PAXLOVID (300/100) 20 X 150 MG & 10 X 100MG PO TBPK: ORAL_TABLET | ORAL | 0 refills | Status: DC

## 2023-08-08 NOTE — Telephone Encounter (Signed)
Positive covid test at home Michael Bernard

## 2023-08-08 NOTE — Telephone Encounter (Signed)
Has he tested himself for covid?  If postive we could consider paxlovid

## 2023-08-08 NOTE — Telephone Encounter (Signed)
Paxlovid called in for patient

## 2023-08-08 NOTE — Telephone Encounter (Signed)
Was exposed to covid symptoms started Saturday evening 89f fever, achy all over, deep raspy cough, sinuses messed up fell like crap, chills and shivers, just fell blah.  He is doing all meds mucinex and tylenol. He doesn't know if he needs antibiotics or not?

## 2023-08-08 NOTE — Telephone Encounter (Signed)
Pt informed of this and stated his understanding on directions and to call if he dosent get better

## 2023-08-08 NOTE — Progress Notes (Signed)
Paxlovid called in for patient. Tested postive for COVID today.  Symptoms started 2 days ago.  High risk due to specific antibody deficiency

## 2023-08-08 NOTE — Telephone Encounter (Signed)
Pt is going to do at home test and call me back with results

## 2023-08-16 DIAGNOSIS — K146 Glossodynia: Secondary | ICD-10-CM | POA: Diagnosis not present

## 2023-08-16 DIAGNOSIS — K145 Plicated tongue: Secondary | ICD-10-CM | POA: Diagnosis not present

## 2023-08-16 DIAGNOSIS — K1379 Other lesions of oral mucosa: Secondary | ICD-10-CM | POA: Diagnosis not present

## 2023-08-24 DIAGNOSIS — H52223 Regular astigmatism, bilateral: Secondary | ICD-10-CM | POA: Diagnosis not present

## 2023-08-24 DIAGNOSIS — H5203 Hypermetropia, bilateral: Secondary | ICD-10-CM | POA: Diagnosis not present

## 2023-08-24 DIAGNOSIS — H524 Presbyopia: Secondary | ICD-10-CM | POA: Diagnosis not present

## 2023-09-04 DIAGNOSIS — G4733 Obstructive sleep apnea (adult) (pediatric): Secondary | ICD-10-CM | POA: Diagnosis not present

## 2023-09-16 ENCOUNTER — Telehealth: Payer: Self-pay | Admitting: Internal Medicine

## 2023-09-16 ENCOUNTER — Other Ambulatory Visit: Payer: Self-pay | Admitting: Internal Medicine

## 2023-09-16 MED ORDER — ESZOPICLONE 3 MG PO TABS: 3.0000 mg | ORAL_TABLET | Freq: Every day | ORAL | 2 refills | Status: DC

## 2023-09-16 NOTE — Telephone Encounter (Signed)
PT calling wanting the Dr. To know he has been paying for his Eszopiclone 3 MG TABS [[id number] on his own because ins will not cover. Is their any alternative to this medication he can try.  His # is [phone]  He has tried three different alternative meds that the insurance has rejected. Administrator).

## 2023-09-16 NOTE — Telephone Encounter (Signed)
Lunesta refilled

## 2023-09-16 NOTE — Telephone Encounter (Signed)
Patient has been paying for Lunesta out of pocket and would like to discuss changing at next visit. He is asking for one more refill until visit in November @ [facility].

## 2023-09-16 NOTE — Telephone Encounter (Signed)
Patient notified.NFN

## 2023-10-01 NOTE — Progress Notes (Signed)
HPI  male former smoker for sleep evaluation.referred by Dr Clent Ridges for ? OSA, wakes up during the night. HST 12/14/17-AHI 37.9/hour, desaturation to 68%, body weight 225 pounds PFT 02/07/18- Mild restriction, mild Diffusion reduction, FVC 3.49/78%, FEV1 2.90/86%, ratio 0.83, FEF 25-75% 3.01/108%, No R to BD, TLC 77%, DLCO 77% ------------------------------------------------------------------------------------  05/30/23- 66 yom former smoker followed for OSA, Insomnia Followed by Dr Judeth Horn and [facility]monary for DOE with ILD/ fibrocystic lung disease/ Lymphocytic Interstitial Pneumonia , chronic Stomatitis, allergic Rhinitis -Temazepam 30,  CPAP auto 5-20/ [employer]   new 2019 Download compliance- 70%, AHI 0.7/ hr Body weight today-218 lbs Complains he falls asleep easily but wakes after 4 hours. Weakness, lethargy, easy DOE.  Tried zolpidem and temazepam 30 every night- not sufficient "Busy Brain".No sleep med now. Bedtime 9:30 PM, latency 30 minutes, WASO 2-3 times, then gets up bet 3:00 and 5:00 AM. Now finds pregablin helps sleep and mouth some. Also using an otc "Dreams powder" with melatonin. Mouth burning oer the past year- to see another ENT soon. Download reviewed. Good control with auto 4-6. We are going to start by trying Lunesta and replacing old CPAP. He may need other intervention, possibly CBT, for "busy brain".  10/03/23- 67 yom former smoker followed for OSA, Insomnia, complicated by Burning Mouth Syndrome,  Followed by Dr Judeth Horn and [facility] for DOE with ILD/ fibrocystic lung disease/ Lymphocytic Interstitial Pneumonia , chronic Stomatitis, allergic Rhinitis -Temazepam 30,  CPAP auto 5-20/ [employer]   new 2019 Download compliance- 100%, AHI 0.2/hr Body weight today-221 lbs Will get flu vax from PCP. Says he is doing very well sith CPAP, used every night Insurance won't cover Ambien or temazepam, but he says Remus Loffler worked best for insomnia. Now wakes 3AM and lies in bed. We  will let him try trazodone and discussed sleep hygiene. Has seen multiple physicians for "burning mouth syndrome". Dr Judeth Horn has referred to Dr Lobo/ [facility]- observation status ILD.   ROS-see HPI   + = positive Constitutional:    weight loss, night sweats, fevers, chills, fatigue, lassitude. HEENT:    headaches, difficulty swallowing, tooth/dental problems, sore throat,       sneezing, itching, ear ache, +nasal congestion, post nasal drip, snoring CV:    chest pain, orthopnea, PND, swelling in lower extremities, anasarca,                                                       dizziness, palpitations Resp:  + shortness of breath with exertion or at rest.               + productive cough,   non-productive cough, coughing up of blood.              change in color of mucus.  wheezing.   Skin:    rash or lesions. GI:     heartburn,+ indigestion, abdominal pain, nausea, vomiting, diarrhea,                 change in bowel habits, loss of appetite GU: dysuria, change in color of urine, no urgency or frequency.   flank pain. MS:   joint pain, stiffness, decreased range of motion, back pain. Neuro-     nothing unusual Psych:  change in mood or affect.  depression or anxiety.  memory loss.   OBJ- Physical Exam General- Alert, Oriented, Affect-appropriate, Distress- none acute,  + overweight Skin- rash-none, lesions- none, excoriation- none Lymphadenopathy- none Head- atraumatic            Eyes- Gross vision intact, PERRLA, conjunctivae and secretions clear            Ears- Hearing, canals-normal            Nose- Clear, no-Septal dev, mucus, polyps, erosion, perforation             Throat- Mallampati III , mucosa clear , drainage- none, tonsils- atrophic,  Neck- flexible , trachea midline, no stridor , thyroid nl, carotid no bruit Chest - symmetrical excursion , unlabored           Heart/CV- RRR , no murmur , no gallop  , no rub, nl s1 s2                           - JVD- none ,  edema- none, stasis changes- none, varices- none           Lung-clear, cough-none, dullness-none, rub- none           Chest wall-  Abd-  Br/ Gen/ Rectal- Not done, not indicated Extrem- cyanosis- none, clubbing, none, atrophy- none, strength- nl Neuro- grossly intact to observation

## 2023-10-03 ENCOUNTER — Other Ambulatory Visit: Payer: Self-pay | Admitting: Family Medicine

## 2023-10-03 ENCOUNTER — Ambulatory Visit: Payer: Medicare HMO | Admitting: Internal Medicine

## 2023-10-03 ENCOUNTER — Encounter: Payer: Self-pay | Admitting: Internal Medicine

## 2023-10-03 VITALS — BP 122/84 | HR 57 | Ht 67.0 in | Wt 221.8 lb

## 2023-10-03 DIAGNOSIS — J849 Interstitial pulmonary disease, unspecified: Secondary | ICD-10-CM | POA: Diagnosis not present

## 2023-10-03 DIAGNOSIS — F5101 Primary insomnia: Secondary | ICD-10-CM

## 2023-10-03 DIAGNOSIS — G4733 Obstructive sleep apnea (adult) (pediatric): Secondary | ICD-10-CM | POA: Diagnosis not present

## 2023-10-03 MED ORDER — TRAZODONE HCL 50 MG PO TABS: ORAL_TABLET | ORAL | 2 refills | Status: DC

## 2023-10-03 NOTE — Patient Instructions (Signed)
Script sent to try Trazodone for sleep - 1 at bedtime as needed

## 2023-10-05 DIAGNOSIS — G4733 Obstructive sleep apnea (adult) (pediatric): Secondary | ICD-10-CM | POA: Diagnosis not present

## 2023-10-11 DIAGNOSIS — L821 Other seborrheic keratosis: Secondary | ICD-10-CM | POA: Diagnosis not present

## 2023-10-11 DIAGNOSIS — L814 Other melanin hyperpigmentation: Secondary | ICD-10-CM | POA: Diagnosis not present

## 2023-10-11 DIAGNOSIS — D225 Melanocytic nevi of trunk: Secondary | ICD-10-CM | POA: Diagnosis not present

## 2023-10-11 DIAGNOSIS — D485 Neoplasm of uncertain behavior of skin: Secondary | ICD-10-CM | POA: Diagnosis not present

## 2023-10-11 DIAGNOSIS — D2239 Melanocytic nevi of other parts of face: Secondary | ICD-10-CM | POA: Diagnosis not present

## 2023-10-11 DIAGNOSIS — L570 Actinic keratosis: Secondary | ICD-10-CM | POA: Diagnosis not present

## 2023-10-28 ENCOUNTER — Other Ambulatory Visit: Payer: Self-pay | Admitting: Internal Medicine

## 2023-10-28 NOTE — Telephone Encounter (Signed)
Trazodone refilled.

## 2023-10-28 NOTE — Telephone Encounter (Signed)
Patient would like to change RX to a 90 day supply. Patient was last seen on 10/03/2023 and the medication was refilled 10/03/2023 as well. Do you want to approve or deny this?

## 2023-11-01 ENCOUNTER — Encounter: Payer: Self-pay | Admitting: Internal Medicine

## 2023-11-01 NOTE — Assessment & Plan Note (Signed)
Managed by [facility] on CPAP

## 2023-11-01 NOTE — Assessment & Plan Note (Signed)
Dr Judeth Horn has referred to Dr Dudley [facility]

## 2023-11-01 NOTE — Assessment & Plan Note (Signed)
Emphasized common aggravating factors, good sleep hygiene Plan- try trazodone 50mg 

## 2023-11-04 DIAGNOSIS — G4733 Obstructive sleep apnea (adult) (pediatric): Secondary | ICD-10-CM | POA: Diagnosis not present

## 2023-11-06 ENCOUNTER — Encounter: Payer: Self-pay | Admitting: Internal Medicine

## 2023-11-07 NOTE — Telephone Encounter (Signed)
Have you ever tried clonazepam?

## 2023-11-08 DIAGNOSIS — C44319 Basal cell carcinoma of skin of other parts of face: Secondary | ICD-10-CM | POA: Diagnosis not present

## 2023-11-30 ENCOUNTER — Other Ambulatory Visit: Payer: Self-pay | Admitting: Family Medicine

## 2023-11-30 ENCOUNTER — Ambulatory Visit: Payer: Medicare Other

## 2023-11-30 VITALS — BP 114/74 | HR 70 | Ht 67.0 in | Wt 212.0 lb

## 2023-11-30 DIAGNOSIS — Z0000 Encounter for general adult medical examination without abnormal findings: Secondary | ICD-10-CM | POA: Diagnosis not present

## 2023-11-30 NOTE — Progress Notes (Addendum)
 Subjective:   Michael Bernard is a 68 y.o. male who presents for Medicare Annual/Subsequent preventive examination.  Visit Complete: Virtual I connected with  Michael Bernard on 11/30/23 by a audio enabled telemedicine application and verified that I am speaking with the correct person using two identifiers.  Patient Location: Home  Provider Location: Home Office  I discussed the limitations of evaluation and management by telemedicine. The patient expressed understanding and agreed to proceed.  Vital Signs: Because this visit was a virtual/telehealth visit, some criteria may be missing or patient reported. Any vitals not documented were not able to be obtained and vitals that have been documented are patient reported.  Patient Medicare AWV questionnaire was completed by the patient on 11/28/23; I have confirmed that all information answered by patient is correct and no changes since this date.  Cardiac Risk Factors include: advanced age (>38men, >10 women);male gender     Objective:    Today's Vitals   11/30/23 0824  BP: 114/74  Pulse: 70  Weight: 212 lb (96.2 kg)  Height: 5' 7 (1.702 m)   Body mass index is 33.2 kg/m.     11/30/2023    8:31 AM 05/21/2021    3:12 PM  Advanced Directives  Does Patient Have a Medical Advance Directive? Yes Yes  Type of Estate Agent of [place];Living will Healthcare Power of [place];Living will  Does patient want to make changes to medical advance directive?  No - Patient declined  Copy of Healthcare Power of Attorney in Chart? No - copy requested No - copy requested  Would patient like information on creating a medical advance directive?  No - Patient declined    Current Medications (verified) Outpatient Encounter Medications as of 11/30/2023  Medication Sig   albuterol  (VENTOLIN  HFA) 108 (90 Base) MCG/ACT inhaler Inhale into the lungs every 6 (six) hours as needed for wheezing or shortness of breath.    Azelastine -Fluticasone  (DYMISTA ) 137-50 MCG/ACT SUSP Place into the nose.   Eszopiclone  3 MG TABS Take 1 tablet (3 mg total) by mouth at bedtime. Take immediately before bedtime (Patient not taking: Reported on 10/03/2023)   MECLIZINE  HCL PO Take by mouth.   nirmatrelvir & ritonavir  (PAXLOVID , 300/100,) 20 x 150 MG & 10 x 100MG  TBPK Normal dosing: 300 mg nirmatrelvir (two 150 mg tablets) with 100 mg ritonavir (one 100 mg tablet), with all three tablets taken together twice daily for 5 days. (Patient not taking: Reported on 10/03/2023)   omeprazole  (PRILOSEC) 40 MG capsule TAKE 1 CAPSULE (40 MG TOTAL) BY MOUTH DAILY.   pregabalin  (LYRICA ) 300 MG capsule TAKE 1 CAPSULE BY MOUTH EVERYDAY AT BEDTIME   temazepam  (RESTORIL ) 30 MG capsule Take 1 capsule (30 mg total) by mouth at bedtime as needed for sleep. (Patient not taking: Reported on 10/03/2023)   TESSALON  PERLES 100 MG capsule Take 1 capsule (100 mg total) by mouth 3 (three) times daily as needed for cough.   traZODone  (DESYREL ) 50 MG tablet TAKE 1 TABLET BY MOUTH AS NEEDED FOR SLEEP   No facility-administered encounter medications on file as of 11/30/2023.    Allergies (verified) Trazodone  and nefazodone   History: Past Medical History:  Diagnosis Date   Anxiety    situational anxiety   Arthritis    LEFT knee   Asthma    Cancer (HCC), Melanoma    skin x 3-4 times   GERD (gastroesophageal reflux disease)    on meds   Hiatal hernia  Kidney stones    Multiple gastric ulcers 2015   seen on endoscopy per Dr. Charlanne in [place]    Seasonal allergies    Sleep apnea    sees Dr. Quita Salt, uses CPAP    Past Surgical History:  Procedure Laterality Date   COLONOSCOPY  07/20/2022   per Dr. Lynnie Charlanne, adenomatous polyps, repeat in 3 yrs   HERNIA REPAIR Bilateral    inguinal hernias repaired with mesh   KNEE SURGERY Left    arthritis and meniscal tear   LEFT HEART CATH AND CORONARY ANGIOGRAPHY N/A 05/22/2021   Procedure: LEFT  HEART CATH AND CORONARY ANGIOGRAPHY;  Surgeon: Verlin Lonni BIRCH, MD;  Location: [facility];  Service: Cardiovascular;  Laterality: N/A;   MELANOMA EXCISION     per Dr. Trudy in [place], 3 removed from both shoulders and left temple    POLYPECTOMY  2017   TA   Family History  Problem Relation Age of Onset   Colon polyps Mother    Arthritis Mother    Cancer Mother    Hyperlipidemia Mother    Hypertension Mother    Leukemia Mother    Atrial fibrillation Father    Heart attack Father    Heart disease Father    Hypertension Father    Hypertension Sister    Seizures Sister    Colon cancer Neg Hx    Esophageal cancer Neg Hx    Stomach cancer Neg Hx    Rectal cancer Neg Hx    Social History   Socioeconomic History   Marital status: Married    Spouse name: Not on file   Number of children: Not on file   Years of education: Not on file   Highest education level: Not on file  Occupational History   Occupation: Self-employed, used car lot, community education officer, International Aid/development Worker franchise  Tobacco Use   Smoking status: Former    Current packs/day: 0.00    Types: Cigarettes    Start date: 06/10/1976    Quit date: 06/11/1979    Years since quitting: 44.5   Smokeless tobacco: Never   Tobacco comments:    pt reports smoker 0.5 pack/month as a teenager 06/11/2019  Vaping Use   Vaping status: Never Used  Substance and Sexual Activity   Alcohol use: Yes    Alcohol/week: 1.0 - 2.0 standard drink of alcohol    Types: 1 - 2 Cans of beer per week    Comment: 1-2 beers per month   Drug use: Never   Sexual activity: Yes  Other Topics Concern   Not on file  Social History Narrative   Not on file   Social Drivers of Health   Financial Resource Strain: Low Risk  (11/30/2023)   Overall Financial Resource Strain (CARDIA)    Difficulty of Paying Living Expenses: Not hard at all  Food Insecurity: No Food Insecurity (11/30/2023)   Hunger Vital Sign    Worried About Running Out of Food in  the Last Year: Never true    Ran Out of Food in the Last Year: Never true  Transportation Needs: No Transportation Needs (11/30/2023)   PRAPARE - Administrator, Civil Service (Medical): No    Lack of Transportation (Non-Medical): No  Physical Activity: Inactive (11/30/2023)   Exercise Vital Sign    Days of Exercise per Week: 0 days    Minutes of Exercise per Session: 0 min  Stress: No Stress Concern Present (11/30/2023)   [employer]  of Occupational Health - Occupational Stress Questionnaire    Feeling of Stress : Not at all  Social Connections: Socially Integrated (11/30/2023)   Social Connection and Isolation Panel [NHANES]    Frequency of Communication with Friends and Family: More than three times a week    Frequency of Social Gatherings with Friends and Family: More than three times a week    Attends Religious Services: More than 4 times per year    Active Member of [employer] or Organizations: Yes    Attends Engineer, Structural: More than 4 times per year    Marital Status: Married    Tobacco Counseling Counseling given: Not Answered Tobacco comments: pt reports smoker 0.5 pack/month as a teenager 06/11/2019   Clinical Intake:  Pre-visit preparation completed: Yes  Pain : No/denies pain     BMI - recorded: 33.2 Nutritional Status: BMI > 30  Obese Nutritional Risks: None Diabetes: No  How often do you need to have someone help you when you read instructions, pamphlets, or other written materials from your doctor or pharmacy?: 1 - Never  Interpreter Needed?: No  Information entered by :: Rojelio Blush LPN   Activities of Daily Living    11/30/2023    8:30 AM 11/28/2023    8:02 AM  In your present state of health, do you have any difficulty performing the following activities:  Hearing? 0 0  Vision? 0 0  Difficulty concentrating or making decisions? 0 0  Walking or climbing stairs? 0 0  Dressing or bathing? 0 0  Doing errands, shopping? 0 0   Preparing Food and eating ? N N  Using the Toilet? N N  In the past six months, have you accidently leaked urine? N N  Do you have problems with loss of bowel control? N   Managing your Medications? N N  Managing your Finances? N N  Housekeeping or managing your Housekeeping? N N    Patient Care Team: Johnny Garnette LABOR, MD as PCP - General (Family Medicine)  Indicate any recent Medical Services you may have received from other than [facility] providers in the past year (date may be approximate).     Assessment:   This is a routine wellness examination for Michael Bernard.  Hearing/Vision screen Hearing Screening - Comments:: Denies hearing difficulties   Vision Screening - Comments:: Wears rx glasses - up to date with routine eye exams with  Dr Oneil Rummer   Goals Addressed               This Visit's Progress     Increase physical activity (pt-stated)         Depression Screen    11/30/2023    8:29 AM 04/05/2023    4:20 PM 12/02/2022    7:59 AM 07/09/2022   11:03 AM 10/20/2021    9:53 AM 10/14/2020   10:06 AM  PHQ 2/9 Scores  PHQ - 2 Score 0 0 5 0 2 0  PHQ- 9 Score  0 22 7 9      Fall Risk    11/30/2023    8:30 AM 11/28/2023    8:02 AM 04/05/2023    4:19 PM 12/02/2022    7:59 AM 07/28/2022    4:42 PM  Fall Risk   Falls in the past year? 1 0 0 0 0  Number falls in past yr: 0 1 0 0 1  Injury with Fall? 0 1 0 0 0  Risk for fall due to : No  Fall Risks  No Fall Risks No Fall Risks No Fall Risks  Follow up Falls prevention discussed  Falls evaluation completed Falls evaluation completed Falls evaluation completed    MEDICARE RISK AT HOME: Medicare Risk at Home Any stairs in or around the home?: (Patient-Rptd) Yes If so, are there any without handrails?: (Patient-Rptd) No Home free of loose throw rugs in walkways, pet beds, electrical cords, etc?: (Patient-Rptd) No Adequate lighting in your home to reduce risk of falls?: (Patient-Rptd) Yes Life alert?: (Patient-Rptd) No Use of a  cane, walker or w/c?: (Patient-Rptd) No Grab bars in the bathroom?: (Patient-Rptd) No Shower chair or bench in shower?: (Patient-Rptd) No Elevated toilet seat or a handicapped toilet?: (Patient-Rptd) No  TIMED UP AND GO:  Was the test performed?  No    Cognitive Function:        11/30/2023    8:32 AM  6CIT Screen  What Year? 0 points  What month? 0 points  What time? 0 points  Count back from 20 0 points  Months in reverse 0 points  Repeat phrase 0 points  Total Score 0 points    Immunizations Immunization History  Administered Date(s) Administered   Influenza,inj,Quad PF,6+ Mos 08/24/2017, 08/31/2018   Influenza-Unspecified 10/07/2020   PFIZER(Purple Top)SARS-COV-2 Vaccination 02/05/2020, 03/11/2020, 10/23/2020    TDAP status: Due, Education has been provided regarding the importance of this vaccine. Advised may receive this vaccine at local pharmacy or Health Dept. Aware to provide a copy of the vaccination record if obtained from local pharmacy or Health Dept. Verbalized acceptance and understanding.  Flu Vaccine status: Due, Education has been provided regarding the importance of this vaccine. Advised may receive this vaccine at local pharmacy or Health Dept. Aware to provide a copy of the vaccination record if obtained from local pharmacy or Health Dept. Verbalized acceptance and understanding.  Pneumococcal vaccine status: Due, Education has been provided regarding the importance of this vaccine. Advised may receive this vaccine at local pharmacy or Health Dept. Aware to provide a copy of the vaccination record if obtained from local pharmacy or Health Dept. Verbalized acceptance and understanding.  Covid-19 vaccine status: Declined, Education has been provided regarding the importance of this vaccine but patient still declined. Advised may receive this vaccine at local pharmacy or Health Dept.or vaccine clinic. Aware to provide a copy of the vaccination record if obtained  from local pharmacy or Health Dept. Verbalized acceptance and understanding.  Qualifies for Shingles Vaccine? Yes   Zostavax completed No   Shingrix Completed?: No.    Education has been provided regarding the importance of this vaccine. Patient has been advised to call insurance company to determine out of pocket expense if they have not yet received this vaccine. Advised may also receive vaccine at local pharmacy or Health Dept. Verbalized acceptance and understanding.  Screening Tests Health Maintenance  Topic Date Due   Pneumonia Vaccine 44+ Years old (1 of 2 - PCV) Never done   Hepatitis C Screening  Never done   DTaP/Tdap/Td (1 - Tdap) Never done   Zoster Vaccines- Shingrix (1 of 2) Never done   INFLUENZA VACCINE  06/23/2023   COVID-19 Vaccine (4 - 2024-25 season) 07/24/2023   Medicare Annual Wellness (AWV)  11/29/2024   Colonoscopy  07/20/2025   HPV VACCINES  Aged Out    Health Maintenance  Health Maintenance Due  Topic Date Due   Pneumonia Vaccine 53+ Years old (1 of 2 - PCV) Never done   Hepatitis C Screening  Never done   DTaP/Tdap/Td (1 - Tdap) Never done   Zoster Vaccines- Shingrix (1 of 2) Never done   INFLUENZA VACCINE  06/23/2023   COVID-19 Vaccine (4 - 2024-25 season) 07/24/2023    Colorectal cancer screening: Type of screening: Colonoscopy. Completed 07/20/22. Repeat every 3 years     Additional Screening:  Hepatitis C Screening: does qualify;  Deferred  Vision Screening: Recommended annual ophthalmology exams for early detection of glaucoma and other disorders of the eye. Is the patient up to date with their annual eye exam?  Yes  Who is the provider or what is the name of the office in which the patient attends annual eye exams? Dr Oneil Rummer If pt is not established with a provider, would they like to be referred to a provider to establish care? No .   Dental Screening: Recommended annual dental exams for proper oral hygiene    Community Resource  Referral / Chronic Care Management:  CRR required this visit?  No   CCM required this visit?  No     Plan:     I have personally reviewed and noted the following in the patient's chart:   Medical and social history Use of alcohol, tobacco or illicit drugs  Current medications and supplements including opioid prescriptions. Patient is not currently taking opioid prescriptions. Functional ability and status Nutritional status Physical activity Advanced directives List of other physicians Hospitalizations, surgeries, and ER visits in previous 12 months Vitals Screenings to include cognitive, depression, and falls Referrals and appointments  In addition, I have reviewed and discussed with patient certain preventive protocols, quality metrics, and best practice recommendations. A written personalized care plan for preventive services as well as general preventive health recommendations were provided to patient.     Rojelio LELON Blush, LPN   06/22/7973   After Visit Summary: (MyChart) Due to this being a telephonic visit, the after visit summary with patients personalized plan was offered to patient via MyChart   Nurse Notes: None

## 2023-11-30 NOTE — Patient Instructions (Addendum)
 Michael Bernard , Thank you for taking time to come for your Medicare Wellness Visit. I appreciate your ongoing commitment to your health goals. Please review the following plan we discussed and let me know if I can assist you in the future.   Referrals/Orders/Follow-Ups/Clinician Recommendations:   This is a list of the screening recommended for you and due dates:  Health Maintenance  Topic Date Due   Pneumonia Vaccine (1 of 2 - PCV) Never done   Hepatitis C Screening  Never done   DTaP/Tdap/Td vaccine (1 - Tdap) Never done   Zoster (Shingles) Vaccine (1 of 2) Never done   Flu Shot  06/23/2023   COVID-19 Vaccine (4 - 2024-25 season) 07/24/2023   Medicare Annual Wellness Visit  11/29/2024   Colon Cancer Screening  07/20/2025   HPV Vaccine  Aged Out    Advanced directives: (Copy Requested) Please bring a copy of your health care power of attorney and living will to the office to be added to your chart at your convenience.  Next Medicare Annual Wellness Visit scheduled for next year: Yes

## 2023-12-03 NOTE — Telephone Encounter (Signed)
 Please see mychart sent by pt and advise.

## 2023-12-04 MED ORDER — CLONAZEPAM 0.5 MG PO TABS: ORAL_TABLET | ORAL | 2 refills | Status: DC

## 2023-12-04 NOTE — Telephone Encounter (Signed)
 Script for clonazepam trial sent to [facility]

## 2023-12-05 DIAGNOSIS — G4733 Obstructive sleep apnea (adult) (pediatric): Secondary | ICD-10-CM | POA: Diagnosis not present

## 2023-12-15 NOTE — Telephone Encounter (Signed)
Should be ok to take pregabalin along with clonazepam, but watch out for oversedation

## 2023-12-16 ENCOUNTER — Encounter: Payer: Self-pay | Admitting: Family Medicine

## 2023-12-16 NOTE — Telephone Encounter (Signed)
That would be fine, but ask him which mail order pharmacy to send this to

## 2023-12-19 ENCOUNTER — Emergency Department (HOSPITAL_COMMUNITY): Payer: Medicare Other

## 2023-12-19 ENCOUNTER — Emergency Department (HOSPITAL_COMMUNITY)
Admission: EM | Admit: 2023-12-19 | Discharge: 2023-12-19 | Disposition: A | Discharge status: Home / Self Care | Payer: Medicare Other | Attending: Student | Admitting: Student

## 2023-12-19 ENCOUNTER — Other Ambulatory Visit: Payer: Self-pay

## 2023-12-19 ENCOUNTER — Encounter (HOSPITAL_COMMUNITY): Payer: Self-pay

## 2023-12-19 DIAGNOSIS — R197 Diarrhea, unspecified: Secondary | ICD-10-CM | POA: Diagnosis not present

## 2023-12-19 DIAGNOSIS — J45909 Unspecified asthma, uncomplicated: Secondary | ICD-10-CM | POA: Insufficient documentation

## 2023-12-19 DIAGNOSIS — G4733 Obstructive sleep apnea (adult) (pediatric): Secondary | ICD-10-CM | POA: Insufficient documentation

## 2023-12-19 DIAGNOSIS — N400 Enlarged prostate without lower urinary tract symptoms: Secondary | ICD-10-CM | POA: Insufficient documentation

## 2023-12-19 DIAGNOSIS — Z85828 Personal history of other malignant neoplasm of skin: Secondary | ICD-10-CM | POA: Insufficient documentation

## 2023-12-19 DIAGNOSIS — K5730 Diverticulosis of large intestine without perforation or abscess without bleeding: Secondary | ICD-10-CM | POA: Insufficient documentation

## 2023-12-19 DIAGNOSIS — N281 Cyst of kidney, acquired: Secondary | ICD-10-CM | POA: Diagnosis not present

## 2023-12-19 DIAGNOSIS — Q433 Congenital malformations of intestinal fixation: Secondary | ICD-10-CM | POA: Diagnosis not present

## 2023-12-19 DIAGNOSIS — Z20822 Contact with and (suspected) exposure to covid-19: Secondary | ICD-10-CM | POA: Insufficient documentation

## 2023-12-19 DIAGNOSIS — D72829 Elevated white blood cell count, unspecified: Secondary | ICD-10-CM | POA: Insufficient documentation

## 2023-12-19 DIAGNOSIS — R112 Nausea with vomiting, unspecified: Secondary | ICD-10-CM | POA: Insufficient documentation

## 2023-12-19 DIAGNOSIS — K219 Gastro-esophageal reflux disease without esophagitis: Secondary | ICD-10-CM | POA: Diagnosis not present

## 2023-12-19 DIAGNOSIS — R109 Unspecified abdominal pain: Secondary | ICD-10-CM | POA: Diagnosis not present

## 2023-12-19 DIAGNOSIS — E860 Dehydration: Secondary | ICD-10-CM | POA: Diagnosis not present

## 2023-12-19 DIAGNOSIS — K429 Umbilical hernia without obstruction or gangrene: Secondary | ICD-10-CM | POA: Diagnosis not present

## 2023-12-19 DIAGNOSIS — Z87891 Personal history of nicotine dependence: Secondary | ICD-10-CM | POA: Insufficient documentation

## 2023-12-19 DIAGNOSIS — K469 Unspecified abdominal hernia without obstruction or gangrene: Secondary | ICD-10-CM | POA: Insufficient documentation

## 2023-12-19 DIAGNOSIS — J439 Emphysema, unspecified: Secondary | ICD-10-CM | POA: Insufficient documentation

## 2023-12-19 LAB — C DIFFICILE QUICK SCREEN W PCR REFLEX
C Diff antigen: NEGATIVE
C Diff interpretation: NOT DETECTED
C Diff toxin: NEGATIVE

## 2023-12-19 LAB — URINALYSIS, ROUTINE W REFLEX MICROSCOPIC
Bacteria, UA: NONE SEEN
Glucose, UA: NEGATIVE mg/dL
Hgb urine dipstick: NEGATIVE
Ketones, ur: NEGATIVE mg/dL
Leukocytes,Ua: NEGATIVE
Nitrite: NEGATIVE
Protein, ur: 100 mg/dL — AB
Specific Gravity, Urine: 1.029 (ref 1.005–1.030)
pH: 5 (ref 5.0–8.0)

## 2023-12-19 LAB — CBC
HCT: 55.8 % — ABNORMAL HIGH (ref 39.0–52.0)
Hemoglobin: 18.5 g/dL — ABNORMAL HIGH (ref 13.0–17.0)
MCH: 30.2 pg (ref 26.0–34.0)
MCHC: 33.2 g/dL (ref 30.0–36.0)
MCV: 91.2 fL (ref 80.0–100.0)
Platelets: 342 10*3/uL (ref 150–400)
RBC: 6.12 MIL/uL — ABNORMAL HIGH (ref 4.22–5.81)
RDW: 13.3 % (ref 11.5–15.5)
WBC: 14.5 10*3/uL — ABNORMAL HIGH (ref 4.0–10.5)
nRBC: 0 % (ref 0.0–0.2)

## 2023-12-19 LAB — RESP PANEL BY RT-PCR (RSV, FLU A&B, COVID)  RVPGX2
Influenza A by PCR: NEGATIVE
Influenza B by PCR: NEGATIVE
Resp Syncytial Virus by PCR: NEGATIVE
SARS Coronavirus 2 by RT PCR: NEGATIVE

## 2023-12-19 LAB — COMPREHENSIVE METABOLIC PANEL
ALT: 21 U/L (ref 0–44)
AST: 23 U/L (ref 15–41)
Albumin: 4.2 g/dL (ref 3.5–5.0)
Alkaline Phosphatase: 66 U/L (ref 38–126)
Anion gap: 12 (ref 5–15)
BUN: 23 mg/dL (ref 8–23)
CO2: 23 mmol/L (ref 22–32)
Calcium: 9.6 mg/dL (ref 8.9–10.3)
Chloride: 107 mmol/L (ref 98–111)
Creatinine, Ser: 1.09 mg/dL (ref 0.61–1.24)
GFR, Estimated: >60 mL/min (ref >60)
Glucose, Bld: 120 mg/dL — ABNORMAL HIGH (ref 70–99)
Potassium: 3.7 mmol/L (ref 3.5–5.1)
Sodium: 142 mmol/L (ref 135–145)
Total Bilirubin: 2 mg/dL — ABNORMAL HIGH (ref 0.0–1.2)
Total Protein: 8.4 g/dL — ABNORMAL HIGH (ref 6.5–8.1)

## 2023-12-19 LAB — LIPASE, BLOOD: Lipase: 31 U/L (ref 11–51)

## 2023-12-19 MED ORDER — ONDANSETRON 4 MG PO TBDP: 4.0000 mg | ORAL_TABLET | Freq: Three times a day (TID) | ORAL | 0 refills | Status: DC | PRN

## 2023-12-19 MED ORDER — ONDANSETRON HCL 4 MG/2ML IJ SOLN
4.0000 mg | Freq: Once | INTRAMUSCULAR | Status: AC
  Administered 2023-12-19: 4 mg via INTRAVENOUS
  Filled 2023-12-19: qty 2

## 2023-12-19 MED ORDER — ONDANSETRON 4 MG PO TBDP
4.0000 mg | ORAL_TABLET | Freq: Once | ORAL | Status: AC
  Administered 2023-12-19: 4 mg via ORAL
  Filled 2023-12-19: qty 1

## 2023-12-19 MED ORDER — IOHEXOL 300 MG/ML  SOLN
100.0000 mL | Freq: Once | INTRAMUSCULAR | Status: AC | PRN
  Administered 2023-12-19: 100 mL via INTRAVENOUS

## 2023-12-19 MED ORDER — LOPERAMIDE HCL 2 MG PO CAPS: 2.0000 mg | ORAL_CAPSULE | Freq: Four times a day (QID) | ORAL | 0 refills | Status: DC | PRN

## 2023-12-19 MED ORDER — LACTATED RINGERS IV BOLUS
1000.0000 mL | Freq: Once | INTRAVENOUS | Status: AC
  Administered 2023-12-19: 1000 mL via INTRAVENOUS

## 2023-12-19 NOTE — ED Triage Notes (Signed)
Patient reports nausea, vomiting, diarrhea, upper abdominal pain, and acid reflux since last night. Hx of hernia.

## 2023-12-20 LAB — GASTROINTESTINAL PANEL BY PCR, STOOL (REPLACES STOOL CULTURE)

## 2023-12-20 NOTE — ED Provider Notes (Signed)
[place] [facility]ERGENCY DEPARTMENT AT [facility] Provider Note  CSN: [id number] Arrival date & time: 12/19/23 1111  Chief Complaint(s) Diarrhea  HPI Michael Bernard is a 68 y.o. male with PMH melanoma, peptic ulcer disease, OSA, GERD who presents emerged department for evaluation of abdominal pain vomiting and diarrhea.  States that symptoms began last night with copious watery diarrhea.  No blood in stool.  Presents progressed to associated vomiting and nausea.  Pain primarily in the epigastrium.  Denies associated chest pain, shortness of breath, headache, fever or other systemic symptoms.   Past Medical History Past Medical History:  Diagnosis Date   Anxiety    situational anxiety   Arthritis    LEFT knee   Asthma    Cancer (HCC), Melanoma    skin x 3-4 times   GERD (gastroesophageal reflux disease)    on meds   Hiatal hernia    Kidney stones    Multiple gastric ulcers 2015   seen on endoscopy per Dr. Chales Abrahams in [place]    Seasonal allergies    Sleep apnea    sees Dr. Fannie Knee, uses CPAP    Patient Active Problem List   Diagnosis Date Noted   ILD (interstitial lung disease) (HCC) 04/05/2023   Umbilical hernia 12/02/2022   Burning mouth syndrome 12/02/2022   B12 deficiency 11/30/2021   Chronic fatigue 11/30/2021   Vertigo 11/30/2021   COVID-19 virus infection 08/24/2021   Chest pain of uncertain etiology    Progressive angina (HCC) 05/21/2021   Unstable angina (HCC)    Seasonal and perennial allergic rhinitis 06/10/2018   Obstructive sleep apnea 11/07/2017   Insomnia 11/07/2017   Dyspnea on exertion 11/07/2017   Multiple gastric ulcers 08/24/2017   Hx of malignant melanoma of skin 08/24/2017   GERD 09/18/2007   NEPHROLITHIASIS, HX OF 09/18/2007   Home Medication(s) Prior to Admission medications   Medication Sig Start Date End Date Taking? Authorizing Provider  loperamide (IMODIUM) 2 MG capsule Take 1 capsule (2 mg total) by mouth 4 (four)  times daily as needed for diarrhea or loose stools. 12/19/23  Yes Gabriele Zwilling, MD  ondansetron (ZOFRAN-ODT) 4 MG disintegrating tablet Take 1 tablet (4 mg total) by mouth every 8 (eight) hours as needed for nausea or vomiting. 12/19/23  Yes Pacen Watford, MD  albuterol (VENTOLIN HFA) 108 (90 Base) MCG/ACT inhaler Inhale into the lungs every 6 (six) hours as needed for wheezing or shortness of breath.    [provider]  Azelastine-Fluticasone (DYMISTA) 137-50 MCG/ACT SUSP Place into the nose.    [provider]  clonazePAM (KLONOPIN) 0.5 MG tablet 1 or 2 tabs as needed for sleep 12/04/23   Jetty Duhamel D, MD  Eszopiclone 3 MG TABS Take 1 tablet (3 mg total) by mouth at bedtime. Take immediately before bedtime Patient not taking: Reported on 10/03/2023 09/16/23   Waymon Budge, MD  MECLIZINE HCL PO Take by mouth.    [provider]  nirmatrelvir & ritonavir (PAXLOVID, 300/100,) 20 x 150 MG & 10 x 100MG  TBPK Normal dosing: 300 mg nirmatrelvir (two 150 mg tablets) with 100 mg ritonavir (one 100 mg tablet), with all three tablets taken together twice daily for 5 days. Patient not taking: Reported on 10/03/2023 08/08/23   Ferol Luz, MD  omeprazole (PRILOSEC) 40 MG capsule TAKE 1 CAPSULE (40 MG TOTAL) BY MOUTH DAILY. 10/06/23   Nelwyn Salisbury, MD  pregabalin (LYRICA) 300 MG capsule TAKE 1 CAPSULE BY MOUTH EVERYDAY  AT BEDTIME 12/01/23   Nelwyn Salisbury, MD  TESSALON PERLES 100 MG capsule Take 1 capsule (100 mg total) by mouth 3 (three) times daily as needed for cough. 11/12/22   Hetty Blend, FNP                                                                                                                                    Past Surgical History Past Surgical History:  Procedure Laterality Date   COLONOSCOPY  07/20/2022   per Dr. Lynann Bologna, adenomatous polyps, repeat in 3 yrs   HERNIA REPAIR Bilateral    inguinal hernias repaired with mesh   KNEE SURGERY Left     arthritis and meniscal tear   LEFT HEART CATH AND CORONARY ANGIOGRAPHY N/A 05/22/2021   Procedure: LEFT HEART CATH AND CORONARY ANGIOGRAPHY;  Surgeon: Kathleene Hazel, MD;  Location: [facility] INVASIVE [facility];  Service: Cardiovascular;  Laterality: N/A;   MELANOMA EXCISION     per Dr. Mayford Knife in [place], 3 removed from both shoulders and left temple    POLYPECTOMY  2017   TA   Family History Family History  Problem Relation Age of Onset   Colon polyps Mother    Arthritis Mother    Cancer Mother    Hyperlipidemia Mother    Hypertension Mother    Leukemia Mother    Atrial fibrillation Father    Heart attack Father    Heart disease Father    Hypertension Father    Hypertension Sister    Seizures Sister    Colon cancer Neg Hx    Esophageal cancer Neg Hx    Stomach cancer Neg Hx    Rectal cancer Neg Hx     Social History Social History   Tobacco Use   Smoking status: Former    Current packs/day: 0.00    Types: Cigarettes    Start date: 06/10/1976    Quit date: 06/11/1979    Years since quitting: 44.5   Smokeless tobacco: Never   Tobacco comments:    pt reports smoker 0.5 pack/month as a teenager 06/11/2019  Vaping Use   Vaping status: Never Used  Substance Use Topics   Alcohol use: Yes    Alcohol/week: 1.0 - 2.0 standard drink of alcohol    Types: 1 - 2 Cans of beer per week    Comment: 1-2 beers per month   Drug use: Never   Allergies Trazodone and nefazodone  Review of Systems Review of Systems  Gastrointestinal:  Positive for abdominal pain, diarrhea, nausea and vomiting.    Physical Exam Vital Signs  I have reviewed the triage vital signs BP (!) 144/87   Pulse 95   Temp 98.4 F (36.9 C) (Oral)   Resp 18   Ht 5\' 6"  (1.676 m)   Wt 99.8 kg   SpO2 99%   BMI 35.51 kg/m   Physical Exam Constitutional:  General: He is not in acute distress.    Appearance: Normal appearance.  HENT:     Head: Normocephalic and atraumatic.     Nose: No  congestion or rhinorrhea.  Eyes:     General:        Right eye: No discharge.        Left eye: No discharge.     Extraocular Movements: Extraocular movements intact.     Pupils: Pupils are equal, round, and reactive to light.  Cardiovascular:     Rate and Rhythm: Normal rate and regular rhythm.     Heart sounds: No murmur heard. Pulmonary:     Effort: No respiratory distress.     Breath sounds: No wheezing or rales.  Abdominal:     General: There is no distension.     Tenderness: There is abdominal tenderness.     Hernia: A hernia is present.  Musculoskeletal:        General: Normal range of motion.     Cervical back: Normal range of motion.  Skin:    General: Skin is warm and dry.  Neurological:     General: No focal deficit present.     Mental Status: He is alert.     ED Results and Treatments Labs (all labs ordered are listed, but only abnormal results are displayed) Labs Reviewed  COMPREHENSIVE METABOLIC PANEL - Abnormal; Notable for the following components:      Result Value   Glucose, Bld 120 (*)    Total Protein 8.4 (*)    Total Bilirubin 2.0 (*)    All other components within normal limits  CBC - Abnormal; Notable for the following components:   WBC 14.5 (*)    RBC 6.12 (*)    Hemoglobin 18.5 (*)    HCT 55.8 (*)    All other components within normal limits  URINALYSIS, ROUTINE W REFLEX MICROSCOPIC - Abnormal; Notable for the following components:   APPearance TURBID (*)    Bilirubin Urine SMALL (*)    Protein, ur 100 (*)    All other components within normal limits  RESP PANEL BY RT-PCR (RSV, FLU A&B, COVID)  RVPGX2  C DIFFICILE QUICK SCREEN W PCR REFLEX    GASTROINTESTINAL PANEL BY PCR, STOOL (REPLACES STOOL CULTURE)  LIPASE, BLOOD                                                                                                                          Radiology CT ABDOMEN PELVIS W CONTRAST Result Date: 12/19/2023 CLINICAL DATA:  Epigastric pain EXAM:  CT ABDOMEN AND PELVIS WITH CONTRAST TECHNIQUE: Multidetector CT imaging of the abdomen and pelvis was performed using the standard protocol following bolus administration of intravenous contrast. RADIATION DOSE REDUCTION: This exam was performed according to the departmental dose-optimization program which includes automated exposure control, adjustment of the mA and/or kV according to patient size and/or use of iterative reconstruction technique. CONTRAST:  OMNIPAQUE IOHEXOL 300 MG/ML  SOLN COMPARISON:  None Available. FINDINGS: Lower chest: Mild emphysematous changes are present. There is atelectasis in the left lower lobe. Hepatobiliary: No focal liver abnormality is seen. No gallstones, gallbladder wall thickening, or biliary dilatation. Pancreas: Unremarkable. No pancreatic ductal dilatation or surrounding inflammatory changes. Spleen: Normal in size without focal abnormality. Adrenals/Urinary Tract: Small left peripelvic cysts are present. Otherwise, the kidneys, adrenal glands and bladder are within normal limits. Stomach/Bowel: There small bowel malrotation. The colon is located in the left abdomen and small bowel is located in the right abdomen. There is colonic diverticulosis. The appendix is not seen. Stomach is within normal limits. There is mild distal esophageal wall thickening. There is no pneumatosis or free air. Vascular/Lymphatic: No significant vascular findings are present. No enlarged abdominal or pelvic lymph nodes. Reproductive: Prostate gland is mildly enlarged. Other: There is a small fat containing umbilical hernia. Musculoskeletal: No acute or significant osseous findings. IMPRESSION: 1. Mild distal esophageal wall thickening worrisome for esophagitis. 2. Small bowel malrotation. No obstruction. 3. Colonic diverticulosis. 4. Mild prostatomegaly. 5. Emphysema. Emphysema (ICD10-J43.9). Electronically Signed   By: Darliss Cheney M.D.   On: 12/19/2023 19:34    Pertinent labs & imaging  results that were available during my care of the patient were reviewed by me and considered in my medical decision making (see MDM for details).  Medications Ordered in ED Medications  ondansetron (ZOFRAN-ODT) disintegrating tablet 4 mg (4 mg Oral Given 12/19/23 1134)  iohexol (OMNIPAQUE) 300 MG/ML solution 100 mL (100 mLs Intravenous Contrast Given 12/19/23 1857)  ondansetron (ZOFRAN) injection 4 mg (4 mg Intravenous Given 12/19/23 1926)  lactated ringers bolus 1,000 mL (0 mLs Intravenous Stopped 12/19/23 2047)                                                                                                                                     Procedures Procedures  (including critical care time)  Medical Decision Making / ED Course   This patient presents to the ED for concern of abdominal pain, vomiting, diarrhea, this involves an extensive number of treatment options, and is a complaint that carries with it a high risk of complications and morbidity.  The differential diagnosis includes viral gastroenteritis, C. difficile, GERD/gastritis, peptic ulcer disease, pancreatitis, gastroparesis, pneumonia, pleurisy, pericarditis  MDM: Patient seen emergency room for evaluation of nausea, vomiting, diarrhea, abdominal pain.  Physical exam with unknown hernia that is nontender to palpation soft.  Mild epigastric tenderness to palpation.  Laboratory evaluation with leukocytosis to 14.5, hemoglobin 18.5 total bili 2.0, urinalysis unremarkable.  COVID, flu, RSV negative.  CT abdomen pelvis concerning for esophagitis some small bowel malrotation but no obstruction mild prostamegaly.  Patient has had frequent recent vomiting likely exacerbates patient's known underlying esophagitis and patient given fluid resuscitation and nausea control with Zofran.  On reevaluation symptoms are significant improving.  We will obtain stool studies and I will call the patient  with results if they are positive.  Patient is able  to tolerate p.o. without difficulty after nausea control.  Patient currently does not meet inpatient criteria for admission and will be discharged with outpatient follow-up.  Return precautions given of which she voiced understanding.   Additional history obtained: -Additional history obtained from wife -External records from outside source obtained and reviewed including: Chart review including previous notes, labs, imaging, consultation notes   Lab Tests: -I ordered, reviewed, and interpreted labs.   The pertinent results include:   Labs Reviewed  COMPREHENSIVE METABOLIC PANEL - Abnormal; Notable for the following components:      Result Value   Glucose, Bld 120 (*)    Total Protein 8.4 (*)    Total Bilirubin 2.0 (*)    All other components within normal limits  CBC - Abnormal; Notable for the following components:   WBC 14.5 (*)    RBC 6.12 (*)    Hemoglobin 18.5 (*)    HCT 55.8 (*)    All other components within normal limits  URINALYSIS, ROUTINE W REFLEX MICROSCOPIC - Abnormal; Notable for the following components:   APPearance TURBID (*)    Bilirubin Urine SMALL (*)    Protein, ur 100 (*)    All other components within normal limits  RESP PANEL BY RT-PCR (RSV, FLU A&B, COVID)  RVPGX2  C DIFFICILE QUICK SCREEN W PCR REFLEX    GASTROINTESTINAL PANEL BY PCR, STOOL (REPLACES STOOL CULTURE)  LIPASE, BLOOD        Imaging Studies ordered: I ordered imaging studies including CTAP I independently visualized and interpreted imaging. I agree with the radiologist interpretation   Medicines ordered and prescription drug management: Meds ordered this encounter  Medications   ondansetron (ZOFRAN-ODT) disintegrating tablet 4 mg   iohexol (OMNIPAQUE) 300 MG/ML solution 100 mL   ondansetron (ZOFRAN) injection 4 mg   lactated ringers bolus 1,000 mL   ondansetron (ZOFRAN-ODT) 4 MG disintegrating tablet    Sig: Take 1 tablet (4 mg total) by mouth every 8 (eight) hours as needed  for nausea or vomiting.    Dispense:  20 tablet    Refill:  0   loperamide (IMODIUM) 2 MG capsule    Sig: Take 1 capsule (2 mg total) by mouth 4 (four) times daily as needed for diarrhea or loose stools.    Dispense:  12 capsule    Refill:  0    -I have reviewed the patients home medicines and have made adjustments as needed  Critical interventions none    Social Determinants of Health:  Factors impacting patients care include: none   Reevaluation: After the interventions noted above, I reevaluated the patient and found that they have :improved  Co morbidities that complicate the patient evaluation  Past Medical History:  Diagnosis Date   Anxiety    situational anxiety   Arthritis    LEFT knee   Asthma    Cancer (HCC), Melanoma    skin x 3-4 times   GERD (gastroesophageal reflux disease)    on meds   Hiatal hernia    Kidney stones    Multiple gastric ulcers 2015   seen on endoscopy per Dr. Chales Abrahams in [place]    Seasonal allergies    Sleep apnea    sees Dr. Fannie Knee, uses CPAP       Dispostion: I considered admission for this patient, but at this time he does not meet inpatient criteria for admission and will be discharged with  outpatient follow-up     Final Clinical Impression(s) / ED Diagnoses Final diagnoses:  Diarrhea of presumed infectious origin  Dehydration     @PCDICTATION @    Glendora Score, MD 12/20/23 1103

## 2023-12-21 ENCOUNTER — Ambulatory Visit (INDEPENDENT_AMBULATORY_CARE_PROVIDER_SITE_OTHER): Payer: Medicare Other | Admitting: Family Medicine

## 2023-12-21 ENCOUNTER — Encounter: Payer: Self-pay | Admitting: Family Medicine

## 2023-12-21 ENCOUNTER — Ambulatory Visit: Payer: Self-pay | Admitting: Family Medicine

## 2023-12-21 VITALS — BP 122/80 | HR 74 | Temp 98.6°F | Wt 210.0 lb

## 2023-12-21 DIAGNOSIS — A084 Viral intestinal infection, unspecified: Secondary | ICD-10-CM

## 2023-12-21 MED ORDER — PREGABALIN 300 MG PO CAPS: 300.0000 mg | ORAL_CAPSULE | Freq: Every day | ORAL | 1 refills | Status: DC

## 2023-12-21 MED ORDER — ESZOPICLONE 3 MG PO TABS: 3.0000 mg | ORAL_TABLET | Freq: Every day | ORAL | 1 refills | Status: DC

## 2023-12-21 MED ORDER — DIPHENOXYLATE-ATROPINE 2.5-0.025 MG PO TABS: 2.0000 | ORAL_TABLET | Freq: Four times a day (QID) | ORAL | 0 refills | Status: DC | PRN

## 2023-12-21 NOTE — Telephone Encounter (Signed)
Dr. Maple Hudson, Please see patient comment regarding medication for sleep and advise.  Thank you.

## 2023-12-21 NOTE — Progress Notes (Signed)
   Subjective:    Patient ID: Michael Bernard, male    DOB: [birth year], 68 y.o.   MRN: [id number]  HPI Here to follow up an ED visit on 12-18-22 when he presented with watery diarrhea and nausea with vomiting. No fever. His exam was normal except for some abdominal tenderness. UA was clear. WBC was 14.5. Respiratory panel was negative. Stool panel was negative. C diff was negative. He was given IV fluids and sent home. Today he feels about the same, although he has not vomited for 48 hours. He is taking Imodium at home with no relief.    Review of Systems  Constitutional:  Negative for diaphoresis and fever.  Respiratory: Negative.    Cardiovascular: Negative.   Gastrointestinal:  Positive for abdominal pain and diarrhea. Negative for abdominal distention, blood in stool, constipation, nausea, rectal pain and vomiting.  Genitourinary: Negative.   Neurological:  Positive for weakness.       Objective:   Physical Exam Constitutional:      Appearance: He is not ill-appearing.  Cardiovascular:     Rate and Rhythm: Normal rate and regular rhythm.     Pulses: Normal pulses.     Heart sounds: Normal heart sounds.  Pulmonary:     Effort: Pulmonary effort is normal.     Breath sounds: Normal breath sounds.  Abdominal:     General: Abdomen is flat. Bowel sounds are normal. There is no distension.     Palpations: Abdomen is soft. There is no mass.     Tenderness: There is no guarding or rebound.     Comments: Moderate generalized tenderness.   Neurological:     Mental Status: He is alert.           Assessment & Plan:  Viral enteritis. He can use Lomotil QID as needed for diarrhea. Drink water and Gatorade. Recheck as needed. We spent a total of ( 33  ) minutes reviewing records and discussing these issues.  Gershon Crane, MD

## 2023-12-21 NOTE — Telephone Encounter (Signed)
Copied from CRM [id number]. Topic: Clinical - Red Word Triage >> Dec 21, 2023  8:07 AM Steele Sizer wrote: Red Word that prompted transfer to Nurse Triage: Olegario Messier wife called and stated that the pt has been sick since sunday, vomitting diarrhea, and nausea. Pt was seen at the ER and was given fluids and medication for nausea, and diarreah. Pt is still having nausea  and diarrhea.  Chief Complaint: diarrhea Symptoms: diarrhea, n/v, abd soreness Frequency: Sunday and worsening Pertinent Negatives: Patient denies fever Disposition: [] ED /[] Urgent Care (no appt availability in office) / [x] Appointment(In office/virtual)/ []  [facility]/ [] Home Care/ [] Refused Recommended Disposition /[] [facility]/ []  Follow-up with PCP Additional Notes: stomach gurgling is worse when laying down: pt has to wear diaper due to leakage  Answer Assessment - Initial Assessment Questions 1. DIARRHEA SEVERITY: "How bad is the diarrhea?" "How many more stools have you had in the past 24 hours than normal?"    - NO DIARRHEA (SCALE 0)   - MILD (SCALE 1-3): Few loose or mushy BMs; increase of 1-3 stools over normal daily number of stools; mild increase in ostomy output.   -  MODERATE (SCALE 4-7): Increase of 4-6 stools daily over normal; moderate increase in ostomy output.   -  SEVERE (SCALE 8-10; OR "WORST POSSIBLE"): Increase of 7 or more stools daily over normal; moderate increase in ostomy output; incontinence.     severe 2. ONSET: "When did the diarrhea begin?"      Sunday 3. BM CONSISTENCY: "How loose or watery is the diarrhea?"      watery 4. VOMITING: "Are you also vomiting?" If Yes, ask: "How many times in the past 24 hours?"      None in 24 hours 5. ABDOMEN PAIN: "Are you having any abdomen pain?" If Yes, ask: "What does it feel like?" (e.g., crampy, dull, intermittent, constant)      Abd soreness and constantly gurgling 6. ABDOMEN PAIN SEVERITY: If present, ask: "How bad is the pain?"  (e.g.,  Scale 1-10; mild, moderate, or severe)   - MILD (1-3): doesn't interfere with normal activities, abdomen soft and not tender to touch    - MODERATE (4-7): interferes with normal activities or awakens from sleep, abdomen tender to touch    - SEVERE (8-10): excruciating pain, doubled over, unable to do any normal activities        Mild - soreness 7. ORAL INTAKE: If vomiting, "Have you been able to drink liquids?" "How much liquids have you had in the past 24 hours?"     almost 8. HYDRATION: "Any signs of dehydration?" (e.g., dry mouth [not just dry lips], too weak to stand, dizziness, new weight loss) "When did you last urinate?"     Pt states hydration - ER gave pt fluids 9. EXPOSURE: "Have you traveled to a foreign country recently?" "Have you been exposed to anyone with diarrhea?" "Could you have eaten any food that was spoiled?"     N/ano 10. ANTIBIOTIC USE: "Are you taking antibiotics now or have you taken antibiotics in the past 2 months?"       N/a 11. OTHER SYMPTOMS: "Do you have any other symptoms?" (e.g., fever, blood in stool)       Watery stool all day 12. PREGNANCY: "Is there any chance you are pregnant?" "When was your last menstrual period?"       N/a  Protocols used: [facility]

## 2023-12-22 MED ORDER — ZOLPIDEM TARTRATE 10 MG PO TABS: ORAL_TABLET | ORAL | 1 refills | Status: DC

## 2023-12-22 NOTE — Telephone Encounter (Signed)
Pt was seen by Dr Clent Ridges for this on 12/21/23

## 2023-12-22 NOTE — Telephone Encounter (Signed)
I sent 90 day script for zolpidem to his [facility]. He mentioned mail order, but we don't have a mail order pharmacy listed for him

## 2023-12-26 ENCOUNTER — Ambulatory Visit: Payer: Medicare Other | Admitting: Family Medicine

## 2024-01-05 DIAGNOSIS — G4733 Obstructive sleep apnea (adult) (pediatric): Secondary | ICD-10-CM | POA: Diagnosis not present

## 2024-01-19 DIAGNOSIS — S0101XA Laceration without foreign body of scalp, initial encounter: Secondary | ICD-10-CM | POA: Diagnosis not present

## 2024-01-27 ENCOUNTER — Telehealth: Payer: Self-pay

## 2024-01-27 ENCOUNTER — Ambulatory Visit: Payer: Medicare Other | Admitting: Family Medicine

## 2024-01-27 ENCOUNTER — Encounter: Payer: Self-pay | Admitting: Family Medicine

## 2024-01-27 VITALS — BP 110/78 | HR 61 | Temp 98.1°F | Ht 67.0 in | Wt 215.0 lb

## 2024-01-27 DIAGNOSIS — K429 Umbilical hernia without obstruction or gangrene: Secondary | ICD-10-CM

## 2024-01-27 DIAGNOSIS — E538 Deficiency of other specified B group vitamins: Secondary | ICD-10-CM | POA: Diagnosis not present

## 2024-01-27 DIAGNOSIS — J849 Interstitial pulmonary disease, unspecified: Secondary | ICD-10-CM

## 2024-01-27 DIAGNOSIS — D751 Secondary polycythemia: Secondary | ICD-10-CM

## 2024-01-27 DIAGNOSIS — R5382 Chronic fatigue, unspecified: Secondary | ICD-10-CM

## 2024-01-27 DIAGNOSIS — K2100 Gastro-esophageal reflux disease with esophagitis, without bleeding: Secondary | ICD-10-CM

## 2024-01-27 DIAGNOSIS — R739 Hyperglycemia, unspecified: Secondary | ICD-10-CM

## 2024-01-27 DIAGNOSIS — F5101 Primary insomnia: Secondary | ICD-10-CM

## 2024-01-27 DIAGNOSIS — G4733 Obstructive sleep apnea (adult) (pediatric): Secondary | ICD-10-CM | POA: Diagnosis not present

## 2024-01-27 DIAGNOSIS — N138 Other obstructive and reflux uropathy: Secondary | ICD-10-CM | POA: Diagnosis not present

## 2024-01-27 DIAGNOSIS — N401 Enlarged prostate with lower urinary tract symptoms: Secondary | ICD-10-CM

## 2024-01-27 LAB — CBC WITH DIFFERENTIAL/PLATELET
Basophils Absolute: 0.1 10*3/uL (ref 0.0–0.1)
Basophils Relative: 0.8 % (ref 0.0–3.0)
Eosinophils Absolute: 0.2 10*3/uL (ref 0.0–0.7)
Eosinophils Relative: 3.3 % (ref 0.0–5.0)
HCT: 54.6 % — ABNORMAL HIGH (ref 39.0–52.0)
Hemoglobin: 18.3 g/dL (ref 13.0–17.0)
Lymphocytes Relative: 22.7 % (ref 12.0–46.0)
Lymphs Abs: 1.6 10*3/uL (ref 0.7–4.0)
MCHC: 33.5 g/dL (ref 30.0–36.0)
MCV: 91.9 fl (ref 78.0–100.0)
Monocytes Absolute: 0.6 10*3/uL (ref 0.1–1.0)
Monocytes Relative: 8.3 % (ref 3.0–12.0)
Neutro Abs: 4.5 10*3/uL (ref 1.4–7.7)
Neutrophils Relative %: 64.9 % (ref 43.0–77.0)
Platelets: 269 10*3/uL (ref 150.0–400.0)
RBC: 5.94 Mil/uL — ABNORMAL HIGH (ref 4.22–5.81)
RDW: 14.3 % (ref 11.5–15.5)
WBC: 7 10*3/uL (ref 4.0–10.5)

## 2024-01-27 LAB — HEMOGLOBIN A1C: Hgb A1c MFr Bld: 5.6 % (ref 4.6–6.5)

## 2024-01-27 MED ORDER — VALACYCLOVIR HCL 1 G PO TABS: 1000.0000 mg | ORAL_TABLET | Freq: Three times a day (TID) | ORAL | 0 refills | Status: DC

## 2024-01-27 NOTE — Addendum Note (Signed)
 Addended by: Gershon Crane A on: 01/27/2024 04:08 PM   Modules accepted: Orders

## 2024-01-27 NOTE — Progress Notes (Signed)
 Subjective:    Patient ID: Michael Bernard, male    DOB: January 16, 1956, 68 y.o.   MRN: [id number]  HPI Here to follow up on issues. He has no acute concerns. He is sleeping well and using his CPAP. His GERD is stable. He takes 500 mcg B12 tablets but only once a week. His ILD is stabl. His umbilical hernia is no larger than before, but he is starting to have pain when he lifts something heavy. He recently had an enteritis but this resolved.    Review of Systems  Constitutional: Negative.   HENT: Negative.    Eyes: Negative.   Respiratory:  Positive for shortness of breath.   Cardiovascular: Negative.   Gastrointestinal:  Positive for abdominal pain.  Genitourinary: Negative.   Musculoskeletal: Negative.   Skin: Negative.   Neurological: Negative.   Psychiatric/Behavioral: Negative.         Objective:   Physical Exam Constitutional:      General: He is not in acute distress.    Appearance: Normal appearance. He is well-developed. He is not diaphoretic.  HENT:     Head: Normocephalic and atraumatic.     Right Ear: External ear normal.     Left Ear: External ear normal.     Nose: Nose normal.     Mouth/Throat:     Pharynx: No oropharyngeal exudate.  Eyes:     General: No scleral icterus.       Right eye: No discharge.        Left eye: No discharge.     Conjunctiva/sclera: Conjunctivae normal.     Pupils: Pupils are equal, round, and reactive to light.  Neck:     Thyroid: No thyromegaly.     Vascular: No JVD.     Trachea: No tracheal deviation.  Cardiovascular:     Rate and Rhythm: Normal rate and regular rhythm.     Pulses: Normal pulses.     Heart sounds: Normal heart sounds. No murmur heard.    No friction rub. No gallop.  Pulmonary:     Effort: Pulmonary effort is normal. No respiratory distress.     Breath sounds: Normal breath sounds. No wheezing or rales.  Chest:     Chest wall: No tenderness.  Abdominal:     General: Bowel sounds are normal. There is no  distension.     Palpations: Abdomen is soft.     Tenderness: There is no abdominal tenderness. There is no guarding or rebound.     Comments: He has a small umbilical hernia that is easily reducible, but it is tender   Genitourinary:    Penis: Normal. No tenderness.      Testes: Normal.     Prostate: Normal.     Rectum: Normal. Guaiac result negative.  Musculoskeletal:        General: No tenderness. Normal range of motion.     Cervical back: Neck supple.  Lymphadenopathy:     Cervical: No cervical adenopathy.  Skin:    General: Skin is warm and dry.     Coloration: Skin is not pale.     Findings: No erythema or rash.  Neurological:     General: No focal deficit present.     Mental Status: He is alert and oriented to person, place, and time.     Cranial Nerves: No cranial nerve deficit.     Motor: No abnormal muscle tone.     Coordination: Coordination normal.     Deep  Tendon Reflexes: Reflexes are normal and symmetric. Reflexes normal.  Psychiatric:        Mood and Affect: Mood normal.        Behavior: Behavior normal.        Thought Content: Thought content normal.        Judgment: Judgment normal.           Assessment & Plan:  His insomnia and chronic fatigue are stable. His GERD and sleep apnea are stable. His ILD is stable, and he sees Pulmonology once a year. We will get fasting labs to check lipids, B12, etc. We will refer him to Surgery for the hernia. We spent a total of (31   ) minutes reviewing records and discussing these issues.  Gershon Crane, MD

## 2024-01-27 NOTE — Addendum Note (Signed)
 Addended by: Gershon Crane A on: 01/27/2024 02:01 PM   Modules accepted: Orders

## 2024-01-27 NOTE — Telephone Encounter (Signed)
 Yes it could be shingles. I will go ahead and send in 7 days of Valtrex for this. He should let us know if this spreads up his arm

## 2024-01-27 NOTE — Telephone Encounter (Signed)
 Noted. I will wait for the full results to come back

## 2024-01-27 NOTE — Telephone Encounter (Signed)
Spoke with pt verbalized understanding of Dr Fry advise 

## 2024-01-27 NOTE — Telephone Encounter (Signed)
Message sent to Dr Fry for advise 

## 2024-01-27 NOTE — Telephone Encounter (Signed)
 Copied from CRM [id number]. Topic: Clinical - Medical Advice >> Jan 27, 2024 11:26 AM Efraim Kaufmann C wrote: Reason for CRM: patient just left after seeing Dr. Clent Ridges today 3/7. He has a rash on his hand that Dr. Clent Ridges said could be a virus. Patient was thinking on the drive home, could this be Shingles? He has never had the Shingles vaccine. Please advise with patient. Thank you

## 2024-01-30 ENCOUNTER — Other Ambulatory Visit (INDEPENDENT_AMBULATORY_CARE_PROVIDER_SITE_OTHER)

## 2024-01-30 ENCOUNTER — Other Ambulatory Visit

## 2024-01-30 DIAGNOSIS — R739 Hyperglycemia, unspecified: Secondary | ICD-10-CM

## 2024-01-30 DIAGNOSIS — R7309 Other abnormal glucose: Secondary | ICD-10-CM | POA: Diagnosis not present

## 2024-01-30 LAB — LIPID PANEL
Cholesterol: 105 mg/dL (ref 0–200)
HDL: 32.1 mg/dL — ABNORMAL LOW (ref >39.00)
LDL Cholesterol: 48 mg/dL (ref 0–99)
NonHDL: 72.92
Total CHOL/HDL Ratio: 3
Triglycerides: 126 mg/dL (ref 0.0–149.0)
VLDL: 25.2 mg/dL (ref 0.0–40.0)

## 2024-01-30 LAB — HEPATIC FUNCTION PANEL
ALT: 18 U/L (ref 0–53)
AST: 16 U/L (ref 0–37)
Albumin: 3.9 g/dL (ref 3.5–5.2)
Alkaline Phosphatase: 60 U/L (ref 39–117)
Bilirubin, Direct: 0.2 mg/dL (ref 0.0–0.3)
Total Bilirubin: 0.8 mg/dL (ref 0.2–1.2)
Total Protein: 6.9 g/dL (ref 6.0–8.3)

## 2024-01-30 LAB — BASIC METABOLIC PANEL
BUN: 13 mg/dL (ref 6–23)
CO2: 27 meq/L (ref 19–32)
Calcium: 9.4 mg/dL (ref 8.4–10.5)
Chloride: 104 meq/L (ref 96–112)
Creatinine, Ser: 1.02 mg/dL (ref 0.40–1.50)
GFR: 76.63 mL/min (ref >60.00)
Glucose, Bld: 92 mg/dL (ref 70–99)
Potassium: 4.2 meq/L (ref 3.5–5.1)
Sodium: 138 meq/L (ref 135–145)

## 2024-01-30 LAB — PSA: PSA: 1.56 ng/mL (ref 0.10–4.00)

## 2024-01-30 LAB — TSH: TSH: 3.55 u[IU]/mL (ref 0.35–5.50)

## 2024-01-30 LAB — VITAMIN B12: Vitamin B-12: 288 pg/mL (ref 211–911)

## 2024-01-31 ENCOUNTER — Telehealth: Payer: Self-pay | Admitting: Family Medicine

## 2024-01-31 NOTE — Telephone Encounter (Signed)
 Copied from CRM [id number]. Topic: General - Other >> Jan 31, 2024  9:11 AM Isabell A wrote: Reason for CRM: Olegario Messier from [facility] calling to inform PCP that he has been scheduled for an office consult on 3/18 with Dr.Tote.

## 2024-02-01 ENCOUNTER — Telehealth: Payer: Self-pay

## 2024-02-01 NOTE — Telephone Encounter (Signed)
 Copied from CRM [id number]. Topic: Clinical - Lab/Test Results >> Feb 01, 2024  9:17 AM Pascal Lux wrote: Reason for CRM: Patient spouse called to leave a message for Dr. Clent Ridges or his nurse. Stated his blood work does not look too good and would like for them to reach out to him and speak to him regarding his blood work results.

## 2024-02-03 NOTE — Telephone Encounter (Signed)
 Spoke with pt reviewed labs with pt verbalized understanding

## 2024-02-03 NOTE — Telephone Encounter (Signed)
 FYI

## 2024-02-07 ENCOUNTER — Ambulatory Visit: Payer: Self-pay | Admitting: General Surgery

## 2024-02-07 DIAGNOSIS — K429 Umbilical hernia without obstruction or gangrene: Secondary | ICD-10-CM | POA: Diagnosis not present

## 2024-02-15 ENCOUNTER — Encounter: Payer: Self-pay | Admitting: Oncology

## 2024-02-15 ENCOUNTER — Inpatient Hospital Stay

## 2024-02-15 ENCOUNTER — Inpatient Hospital Stay: Attending: Oncology | Admitting: Oncology

## 2024-02-15 VITALS — BP 116/75 | HR 66 | Temp 97.6°F | Resp 18 | Ht 67.0 in | Wt 219.1 lb

## 2024-02-15 DIAGNOSIS — Z806 Family history of leukemia: Secondary | ICD-10-CM | POA: Diagnosis not present

## 2024-02-15 DIAGNOSIS — D751 Secondary polycythemia: Secondary | ICD-10-CM

## 2024-02-15 DIAGNOSIS — G4733 Obstructive sleep apnea (adult) (pediatric): Secondary | ICD-10-CM | POA: Diagnosis not present

## 2024-02-15 DIAGNOSIS — Z87891 Personal history of nicotine dependence: Secondary | ICD-10-CM | POA: Diagnosis not present

## 2024-02-15 DIAGNOSIS — Z79899 Other long term (current) drug therapy: Secondary | ICD-10-CM | POA: Insufficient documentation

## 2024-02-15 DIAGNOSIS — Z809 Family history of malignant neoplasm, unspecified: Secondary | ICD-10-CM | POA: Diagnosis not present

## 2024-02-15 DIAGNOSIS — Z85820 Personal history of malignant melanoma of skin: Secondary | ICD-10-CM | POA: Diagnosis not present

## 2024-02-15 DIAGNOSIS — J849 Interstitial pulmonary disease, unspecified: Secondary | ICD-10-CM | POA: Insufficient documentation

## 2024-02-15 LAB — FERRITIN: Ferritin: 147 ng/mL (ref 24–336)

## 2024-02-15 LAB — CMP (CANCER CENTER ONLY)
ALT: 19 U/L (ref 0–44)
AST: 19 U/L (ref 15–41)
Albumin: 4 g/dL (ref 3.5–5.0)
Alkaline Phosphatase: 67 U/L (ref 38–126)
Anion gap: 5 (ref 5–15)
BUN: 13 mg/dL (ref 8–23)
CO2: 28 mmol/L (ref 22–32)
Calcium: 9.2 mg/dL (ref 8.9–10.3)
Chloride: 106 mmol/L (ref 98–111)
Creatinine: 0.98 mg/dL (ref 0.61–1.24)
GFR, Estimated: >60 mL/min (ref >60)
Glucose, Bld: 93 mg/dL (ref 70–99)
Potassium: 4.1 mmol/L (ref 3.5–5.1)
Sodium: 139 mmol/L (ref 135–145)
Total Bilirubin: 0.9 mg/dL (ref 0.0–1.2)
Total Protein: 7.6 g/dL (ref 6.5–8.1)

## 2024-02-15 LAB — CBC WITH DIFFERENTIAL (CANCER CENTER ONLY)
Abs Immature Granulocytes: 0.02 10*3/uL (ref 0.00–0.07)
Basophils Absolute: 0.1 10*3/uL (ref 0.0–0.1)
Basophils Relative: 1 %
Eosinophils Absolute: 0.3 10*3/uL (ref 0.0–0.5)
Eosinophils Relative: 4 %
HCT: 50 % (ref 39.0–52.0)
Hemoglobin: 16.9 g/dL (ref 13.0–17.0)
Immature Granulocytes: 0 %
Lymphocytes Relative: 27 %
Lymphs Abs: 1.8 10*3/uL (ref 0.7–4.0)
MCH: 30.6 pg (ref 26.0–34.0)
MCHC: 33.8 g/dL (ref 30.0–36.0)
MCV: 90.6 fL (ref 80.0–100.0)
Monocytes Absolute: 0.6 10*3/uL (ref 0.1–1.0)
Monocytes Relative: 9 %
Neutro Abs: 3.9 10*3/uL (ref 1.7–7.7)
Neutrophils Relative %: 59 %
Platelet Count: 259 10*3/uL (ref 150–400)
RBC: 5.52 MIL/uL (ref 4.22–5.81)
RDW: 14 % (ref 11.5–15.5)
WBC Count: 6.6 10*3/uL (ref 4.0–10.5)
nRBC: 0 % (ref 0.0–0.2)

## 2024-02-15 LAB — IRON AND IRON BINDING CAPACITY (CC-WL,HP ONLY)
Iron: 92 ug/dL (ref 45–182)
Saturation Ratios: 28 % (ref 17.9–39.5)
TIBC: 332 ug/dL (ref 250–450)
UIBC: 240 ug/dL (ref 117–376)

## 2024-02-15 LAB — LACTATE DEHYDROGENASE: LDH: 149 U/L (ref 98–192)

## 2024-02-15 NOTE — Assessment & Plan Note (Signed)
 ILD may contribute to secondary polycythemia due to impaired oxygen exchange. Evaluated by a lung specialist. Sleep apnea managed with CPAP may also contribute to elevated hemoglobin levels.

## 2024-02-15 NOTE — Assessment & Plan Note (Signed)
 Elevated hemoglobin levels with recent values of 18.5 and 18.3.   Differential diagnosis includes primary polycythemia due to potential JAK2 mutation or secondary causes such as ILD and sleep apnea. His ILD and CPAP use for sleep apnea may contribute to elevated red cell count.   Discussed risks of thrombosis, stroke, and cardiovascular complications due to increased blood viscosity. Family history of leukemia noted.   Stricter hematocrit control necessary if JAK2 mutation is present. Phlebotomy may be required to manage hematocrit levels, targeting below 45 if JAK2 mutation is present, and below 55 if absent.  Labs today actually showed improved hemoglobin of 16.9, hematocrit 50.  White count and platelet count are within normal limits.  CMP, iron studies, LDH are all unremarkable.  We did check erythropoietin level and JAK2 mutation with reflex testing for CALR, MPL, exon 12-15 mutations for further analysis.  - Schedule follow-up call next Friday to discuss test results.  - Consider phlebotomy if hematocrit remains above 55, targeting below 45 if JAK2 mutation is present.  - Plan follow-up in three months, adjust based on test results.  -We will consider bone marrow biopsy if JAK2 mutation is noted.

## 2024-02-15 NOTE — Progress Notes (Signed)
 [facility]  HEMATOLOGY CLINIC CONSULTATION NOTE    PATIENT NAME: Michael Bernard   MR#: [id number] DOB: Sep 21, 1956  DATE OF SERVICE: 02/15/2024   REFERRING PHYSICIAN  Nelwyn Salisbury, MD   Patient Care Team: Nelwyn Salisbury, MD as PCP - General (Family Medicine) Ferol Luz, MD as Consulting Physician (Allergy) Hunsucker, Lesia Sago, MD as Consulting Physician (Pulmonary Disease) Waymon Budge, MD as Consulting Physician (Pulmonary Disease)    REASON FOR CONSULTATION/ CHIEF COMPLAINT: Polycythemia  ASSESSMENT & PLAN:  Michael Bernard is a 68 y.o. gentleman with past medical history of  "Burning mouth syndrome", ILD/ fibrocystic lung disease/ Lymphocytic Interstitial Pneumonia, melanoma of skin, hiatal hernia, multiple gastric ulcers, obstructive sleep apnea on CPAP, was referred to our clinic for evaluation of polycythemia.  Polycythemia Elevated hemoglobin levels with recent values of 18.5 and 18.3.   Differential diagnosis includes primary polycythemia due to potential JAK2 mutation or secondary causes such as ILD and sleep apnea. His ILD and CPAP use for sleep apnea may contribute to elevated red cell count.   Discussed risks of thrombosis, stroke, and cardiovascular complications due to increased blood viscosity. Family history of leukemia noted.   Stricter hematocrit control necessary if JAK2 mutation is present. Phlebotomy may be required to manage hematocrit levels, targeting below 45 if JAK2 mutation is present, and below 55 if absent.  Labs today actually showed improved hemoglobin of 16.9, hematocrit 50.  White count and platelet count are within normal limits.  CMP, iron studies, LDH are all unremarkable.  We did check erythropoietin level and JAK2 mutation with reflex testing for CALR, MPL, exon 12-15 mutations for further analysis.  - Schedule follow-up call next Friday to discuss test results.  - Consider phlebotomy if hematocrit  remains above 55, targeting below 45 if JAK2 mutation is present.  - Plan follow-up in three months, adjust based on test results.  -We will consider bone marrow biopsy if JAK2 mutation is noted.  ILD (interstitial lung disease) (HCC) ILD may contribute to secondary polycythemia due to impaired oxygen exchange. Evaluated by a lung specialist. Sleep apnea managed with CPAP may also contribute to elevated hemoglobin levels.  Hx of malignant melanoma of skin Multiple melanomas on the shoulder and face, previously excised. No current active lesions reported.   I reviewed lab results and outside records for this visit and discussed relevant results with the patient. Diagnosis, plan of care and treatment options were also discussed in detail with the patient. Opportunity provided to ask questions and answers provided to his apparent satisfaction. Provided instructions to call our clinic with any problems, questions or concerns prior to return visit. I recommended to continue follow-up with PCP and sub-specialists. He verbalized understanding and agreed with the plan. No barriers to learning was detected.  Meryl Crutch, MD  02/15/2024 1:48 PM  [facility] - [facility] [address] [place] [address] Dept: [phone] Dept Fax: [phone]   HISTORY OF PRESENTING ILLNESS:    Discussed the use of AI scribe software for clinical note transcription with the patient, who gave verbal consent to proceed.   On 01/27/2024, routine labs at his PCPs office showed elevated hemoglobin of 18.3, hematocrit of 54.6.  White count and platelet count were within normal limits.  Previously on 12/18/2018, his hemoglobin was 18.5, hematocrit 55.8.  Given persistent polycythemia, referral was sent to [place] for further evaluation.  The patient reports that this is a new finding and has never had high hemoglobin count in the past. The  patient also mentions a significant weight loss of eight pounds in five days due to a suspected norovirus infection earlier in the year.  The patient's burning mouth syndrome has been persistent and has led to dietary changes, including avoiding spicy foods, soy, soft drinks, sugar, and bread. Despite these changes, the patient reports that the burning sensation is still present and affects his daily life.  The patient's interstitial lung disease and sleep apnea are reportedly under control with the use of a CPAP machine. However, the patient reports struggling with insomnia and only getting four to six hours of sleep per night.  The patient also mentions a history of multiple melanomas that have been surgically removed. The patient is also scheduled for a hernia repair, which has been postponed due to the current investigation of his high hemoglobin count.   MEDICAL HISTORY:  Past Medical History:  Diagnosis Date   Anxiety    situational anxiety   Arthritis    LEFT knee   Asthma    Cancer (HCC), Melanoma    skin x 3-4 times   GERD (gastroesophageal reflux disease)    on meds   Hiatal hernia    Kidney stones    Multiple gastric ulcers 2015   seen on endoscopy per Dr. Chales Abrahams in [place]    Seasonal allergies    Sleep apnea    sees Dr. Fannie Knee, uses CPAP     SURGICAL HISTORY: Past Surgical History:  Procedure Laterality Date   COLONOSCOPY  07/20/2022   per Dr. Lynann Bologna, adenomatous polyps, repeat in 3 yrs   HERNIA REPAIR Bilateral    inguinal hernias repaired with mesh   KNEE SURGERY Left    arthritis and meniscal tear   LEFT HEART CATH AND CORONARY ANGIOGRAPHY N/A 05/22/2021   Procedure: LEFT HEART CATH AND CORONARY ANGIOGRAPHY;  Surgeon: Kathleene Hazel, MD;  Location: [facility];  Service: Cardiovascular;  Laterality: N/A;   MELANOMA EXCISION     per Dr. Mayford Knife in [place], 3 removed from both shoulders and left temple    POLYPECTOMY  2017   TA     SOCIAL HISTORY: Social History   Socioeconomic History   Marital status: Married    Spouse name: Not on file   Number of children: Not on file   Years of education: Not on file   Highest education level: Associate degree: academic program  Occupational History   Occupation: Self-employed, used car lot, Community education officer, International aid/development worker franchise  Tobacco Use   Smoking status: Former    Current packs/day: 0.00    Types: Cigarettes    Start date: 06/10/1976    Quit date: 06/11/1979    Years since quitting: 44.7   Smokeless tobacco: Never   Tobacco comments:    pt reports smoker 0.5 pack/month as a teenager 06/11/2019  Vaping Use   Vaping status: Never Used  Substance and Sexual Activity   Alcohol use: Yes    Alcohol/week: 1.0 - 2.0 standard drink of alcohol    Types: 1 - 2 Cans of beer per week    Comment: 1-2 beers per month   Drug use: Never   Sexual activity: Yes  Other Topics Concern   Not on file  Social History Narrative   Not on file   Social Drivers of Health   Financial Resource Strain: Low Risk  (01/26/2024)  Overall Financial Resource Strain (CARDIA)    Difficulty of Paying Living Expenses: Not hard at all  Food Insecurity: No Food Insecurity (02/15/2024)   Hunger Vital Sign    Worried About Running Out of Food in the Last Year: Never true    Ran Out of Food in the Last Year: Never true  Transportation Needs: No Transportation Needs (02/15/2024)   PRAPARE - Administrator, Civil Service (Medical): No    Lack of Transportation (Non-Medical): No  Physical Activity: Inactive (01/26/2024)   Exercise Vital Sign    Days of Exercise per Week: 0 days    Minutes of Exercise per Session: 0 min  Stress: Stress Concern Present (01/26/2024)   [employer] of Occupational Health - Occupational Stress Questionnaire    Feeling of Stress : To some extent  Social Connections: Socially Integrated (01/26/2024)   Social Connection and Isolation Panel [NHANES]    Frequency of  Communication with Friends and Family: More than three times a week    Frequency of Social Gatherings with Friends and Family: Once a week    Attends Religious Services: More than 4 times per year    Active Member of [employer] or Organizations: No    Attends Engineer, structural: More than 4 times per year    Marital Status: Married  Catering manager Violence: Not At Risk (02/15/2024)   Humiliation, Afraid, Rape, and Kick questionnaire    Fear of Current or Ex-Partner: No    Emotionally Abused: No    Physically Abused: No    Sexually Abused: No    FAMILY HISTORY: Family History  Problem Relation Age of Onset   Colon polyps Mother    Arthritis Mother    Cancer Mother    Hyperlipidemia Mother    Hypertension Mother    Leukemia Mother    Atrial fibrillation Father    Heart attack Father    Heart disease Father    Hypertension Father    Hypertension Sister    Seizures Sister    Colon cancer Neg Hx    Esophageal cancer Neg Hx    Stomach cancer Neg Hx    Rectal cancer Neg Hx     CURRENT MEDICATIONS   Current Outpatient Medications  Medication Instructions   albuterol (VENTOLIN HFA) 108 (90 Base) MCG/ACT inhaler Every 6 hours PRN   Azelastine-Fluticasone (DYMISTA) 137-50 MCG/ACT SUSP Place into the nose.   diphenoxylate-atropine (LOMOTIL) 2.5-0.025 MG tablet 2 tablets, Oral, 4 times daily PRN   Eszopiclone 3 mg, Oral, Daily at bedtime, Take immediately before bedtime   loperamide (IMODIUM) 2 mg, Oral, 4 times daily PRN   MECLIZINE HCL PO Take by mouth.   omeprazole (PRILOSEC) 40 mg, Oral, Daily   ondansetron (ZOFRAN-ODT) 4 mg, Oral, Every 8 hours PRN   pregabalin (LYRICA) 300 mg, Oral, Daily   Tessalon Perles 100 mg, Oral, 3 times daily PRN   zolpidem (AMBIEN) 10 MG tablet 1 at bedtime for sleep if needed     ALLERGIES  He is allergic to trazodone and nefazodone.  REVIEW OF SYSTEMS:    Review of Systems - Oncology  All of the other pertinent systems were  reviewed with the patient and were unremarkable except as mentioned above in HPI.  PHYSICAL EXAMINATION:   Onc Performance Status - 02/15/24 0800       ECOG Perf Status   ECOG Perf Status Restricted in physically strenuous activity but ambulatory and able to carry out work of a  light or sedentary nature, e.g., light house work, office work      KPS SCALE   KPS % SCORE Able to carry on normal activity, minor s/s of disease             Vitals:   02/15/24 0844  BP: 116/75  Pulse: 66  Resp: 18  Temp: 97.6 F (36.4 C)  SpO2: 97%   Filed Weights   02/15/24 0844  Weight: 219 lb 1.6 oz (99.4 kg)    Physical Exam Constitutional:      General: He is not in acute distress.    Appearance: Normal appearance.  HENT:     Head: Normocephalic and atraumatic.  Eyes:     General: No scleral icterus.    Conjunctiva/sclera: Conjunctivae normal.  Cardiovascular:     Rate and Rhythm: Normal rate and regular rhythm.     Heart sounds: Normal heart sounds.  Pulmonary:     Effort: Pulmonary effort is normal.     Breath sounds: Normal breath sounds.  Abdominal:     General: There is no distension.     Palpations: Abdomen is soft. There is no mass.  Musculoskeletal:     Right lower leg: No edema.     Left lower leg: No edema.  Lymphadenopathy:     Cervical: No cervical adenopathy.  Neurological:     General: No focal deficit present.     Mental Status: He is alert and oriented to person, place, and time.  Psychiatric:        Mood and Affect: Mood normal.        Behavior: Behavior normal.        Thought Content: Thought content normal.      LABORATORY DATA:   I have reviewed the data as listed.  Results for orders placed or performed in visit on 02/15/24  CBC with Differential (Cancer Center Only)  Result Value Ref Range   WBC Count 6.6 4.0 - 10.5 K/uL   RBC 5.52 4.22 - 5.81 MIL/uL   Hemoglobin 16.9 13.0 - 17.0 g/dL   HCT 78.2 95.6 - 21.3 %   MCV 90.6 80.0 - 100.0 fL    MCH 30.6 26.0 - 34.0 pg   MCHC 33.8 30.0 - 36.0 g/dL   RDW 08.6 57.8 - 46.9 %   Platelet Count 259 150 - 400 K/uL   nRBC 0.0 0.0 - 0.2 %   Neutrophils Relative % 59 %   Neutro Abs 3.9 1.7 - 7.7 K/uL   Lymphocytes Relative 27 %   Lymphs Abs 1.8 0.7 - 4.0 K/uL   Monocytes Relative 9 %   Monocytes Absolute 0.6 0.1 - 1.0 K/uL   Eosinophils Relative 4 %   Eosinophils Absolute 0.3 0.0 - 0.5 K/uL   Basophils Relative 1 %   Basophils Absolute 0.1 0.0 - 0.1 K/uL   Immature Granulocytes 0 %   Abs Immature Granulocytes 0.02 0.00 - 0.07 K/uL  CMP (Cancer Center only)  Result Value Ref Range   Sodium 139 135 - 145 mmol/L   Potassium 4.1 3.5 - 5.1 mmol/L   Chloride 106 98 - 111 mmol/L   CO2 28 22 - 32 mmol/L   Glucose, Bld 93 70 - 99 mg/dL   BUN 13 8 - 23 mg/dL   Creatinine 6.29 5.28 - 1.24 mg/dL   Calcium 9.2 8.9 - 41.3 mg/dL   Total Protein 7.6 6.5 - 8.1 g/dL   Albumin 4.0 3.5 - 5.0 g/dL  AST 19 15 - 41 U/L   ALT 19 0 - 44 U/L   Alkaline Phosphatase 67 38 - 126 U/L   Total Bilirubin 0.9 0.0 - 1.2 mg/dL   GFR, Estimated >09 >81 mL/min   Anion gap 5 5 - 15  Iron and Iron Binding Capacity ([facility] only)  Result Value Ref Range   Iron 92 45 - 182 ug/dL   TIBC 191 478 - 295 ug/dL   Saturation Ratios 28 17.9 - 39.5 %   UIBC 240 117 - 376 ug/dL  Lactate dehydrogenase  Result Value Ref Range   LDH 149 98 - 192 U/L     RADIOGRAPHIC STUDIES:  No recent pertinent imaging available to review.  Orders Placed This Encounter  Procedures   CBC with Differential (Cancer Center Only)    Standing Status:   Future    Number of Occurrences:   1    Expiration Date:   02/14/2025   CMP (Cancer Center only)    Standing Status:   Future    Number of Occurrences:   1    Expiration Date:   02/14/2025   Iron and Iron Binding Capacity ([facility] only)    Standing Status:   Future    Number of Occurrences:   1    Expiration Date:   02/14/2025   Ferritin    Standing Status:   Future    Number  of Occurrences:   1    Expiration Date:   02/14/2025   Lactate dehydrogenase    Standing Status:   Future    Number of Occurrences:   1    Expiration Date:   02/14/2025   JAK2 V617F rfx CALR/MPL/E12-15    Standing Status:   Future    Number of Occurrences:   1    Expiration Date:   02/14/2025   Erythropoietin    Standing Status:   Future    Number of Occurrences:   1    Expiration Date:   02/14/2025    Future Appointments  Date Time Provider Department Center  02/24/2024  8:45 AM Jary Louvier, Archie Patten, MD [facility] None  05/17/2024  8:00 AM [facility] None  05/17/2024  8:30 AM Persais Ethridge, MD [facility] None  10/02/2024  2:30 PM Jetty Duhamel D, MD [facility] None  12/05/2024  8:00 AM [facility]AL WE[facility]NESS VISIT [facility]     I spent a total of 55 minutes during this encounter with the patient including review of chart and various tests results, discussions about plan of care and coordination of care plan.   This document was completed utilizing speech recognition software. Grammatical errors, random word insertions, pronoun errors, and incomplete sentences are an occasional consequence of this system due to software limitations, ambient noise, and hardware issues. Any formal questions or concerns about the content, text or information contained within the body of this dictation should be directly addressed to the provider for clarification.

## 2024-02-15 NOTE — Assessment & Plan Note (Signed)
 Multiple melanomas on the shoulder and face, previously excised. No current active lesions reported.

## 2024-02-16 LAB — ERYTHROPOIETIN: Erythropoietin: 8.3 m[IU]/mL (ref 2.6–18.5)

## 2024-02-22 LAB — CALR +MPL + E12-E15  (REFLEX)

## 2024-02-22 LAB — JAK2 V617F RFX CALR/MPL/E12-15

## 2024-02-23 ENCOUNTER — Telehealth: Payer: Self-pay | Admitting: Internal Medicine

## 2024-02-23 NOTE — Telephone Encounter (Signed)
 Called pt to let him know that he needs to go see his PCP, per Maurine Minister.

## 2024-02-23 NOTE — Telephone Encounter (Signed)
 Pt states he had shingles and now bell'ss palsy. He has pain behind his right ear. He is asking who he should follow up with. Pt request a call back.

## 2024-02-24 ENCOUNTER — Inpatient Hospital Stay: Attending: Oncology | Admitting: Oncology

## 2024-02-24 ENCOUNTER — Telehealth: Payer: Self-pay | Admitting: Oncology

## 2024-02-24 ENCOUNTER — Encounter: Payer: Self-pay | Admitting: Oncology

## 2024-02-24 DIAGNOSIS — D751 Secondary polycythemia: Secondary | ICD-10-CM

## 2024-02-24 DIAGNOSIS — J849 Interstitial pulmonary disease, unspecified: Secondary | ICD-10-CM

## 2024-02-24 DIAGNOSIS — Z85820 Personal history of malignant melanoma of skin: Secondary | ICD-10-CM

## 2024-02-24 NOTE — Assessment & Plan Note (Signed)
 ILD may contribute to secondary polycythemia due to impaired oxygen exchange. Evaluated by a lung specialist. Sleep apnea managed with CPAP may also contribute to elevated hemoglobin levels.

## 2024-02-24 NOTE — Progress Notes (Signed)
 [name]  HEMATOLOGY-ONCOLOGY ELECTRONIC VISIT PROGRESS NOTE  PATIENT NAME: Michael Bernard   MR#: [id number] DOB: [birth year]  DATE OF SERVICE: 02/24/2024  Patient Care Team: Nelwyn Salisbury, MD as PCP - General (Family Medicine) Ferol Luz, MD as Consulting Physician (Allergy) Hunsucker, Lesia Sago, MD as Consulting Physician (Pulmonary Disease) Waymon Budge, MD as Consulting Physician (Pulmonary Disease)  I connected with the patient via telephone conference and verified that I am speaking with the correct person using two identifiers. The patient's location is at home and I am providing care from the [facility].  I discussed the limitations, risks, security and privacy concerns of performing an evaluation and management service by e-visits and the availability of in person appointments.  I also discussed with the patient that there may be a patient responsible charge related to this service. The patient expressed understanding and agreed to proceed.   ASSESSMENT & PLAN:   Michael Bernard is a 68 y.o. gentleman with past medical history of  "Burning mouth syndrome", ILD/ fibrocystic lung disease/ Lymphocytic Interstitial Pneumonia, melanoma of skin, hiatal hernia, multiple gastric ulcers, obstructive sleep apnea on CPAP, was referred to our clinic for evaluation of polycythemia.   Secondary polycythemia Elevated hemoglobin levels with recent values of 18.5 and 18.3.   On his consultation with [place] on 02/15/2024, labs actually showed improved hemoglobin of 16.9, hematocrit 50.  White count and platelet count were within normal limits.  CMP, iron studies, LDH, erythropoietin levels were all unremarkable. JAK2 mutation with reflex testing for CALR, MPL, exon 12-15 mutations was also negative.  His ILD and CPAP use for sleep apnea may contribute to his secondary polycythemia.  Discussed risks of thrombosis, stroke, and cardiovascular complications due to  increased blood viscosity.  Consider phlebotomy if hematocrit remains above 55 to minimize complications.   We will arrange for lab check in approximately 6 weeks and then see him in 3 months for follow-up with repeat labs.  ILD (interstitial lung disease) (HCC) ILD may contribute to secondary polycythemia due to impaired oxygen exchange. Evaluated by a lung specialist. Sleep apnea managed with CPAP may also contribute to elevated hemoglobin levels.  Hx of malignant melanoma of skin Multiple melanomas on the shoulder and face, previously excised. No current active lesions reported.   I discussed the assessment and treatment plan with the patient. The patient was provided an opportunity to ask questions and all were answered. The patient agreed with the plan and demonstrated an understanding of the instructions. The patient was advised to call back or seek an in-person evaluation if the symptoms worsen or if the condition fails to improve as anticipated.    I spent 11 minutes over the phone with the patient reviewing test results, discuss management and coordination/planning of care.  Meryl Crutch, MD 02/24/2024 2:32 PM [facility] - A DEPT OF [facility] [address] [place] [address] Dept: [phone] Dept Fax: [phone]   INTERVAL HISTORY:  Please see above for problem oriented charting.  The purpose of today's discussion is to explain recent lab results and to formulate plan of care.  Recent lab results show improvement in hemoglobin and hematocrit levels, with hemoglobin decreasing from 18.3 to 16.9 and hematocrit from 54.6 to 50.0. Previous levels in January were 18.5 and 55.8, respectively. Other lab results, including liver function tests, electrolytes, kidney function, and iron levels, were normal. Erythropoietin levels were  normal, and JAK2 mutation testing was negative, along with other bone marrow  mutation tests. He is currently asymptomatic regarding his blood counts.  He has a history of interstitial lung disease, which may contribute to periodic elevations in red blood cell counts due to hypoxia. No current symptoms related to this condition are reported.  He experienced a recent episode of Bell's palsy, which began last Sunday, affecting the right side of his face. He had a previous episode 15-16 years ago on the left side. He recently completed treatment for shingles about two and a half weeks ago, which he suspects may be related to the onset of Bell's palsy. He has an upcoming appointment with his regular doctor to address this issue.  SUMMARY OF HEMATOLOGY HISTORY:  On 01/27/2024, routine labs at his PCPs office showed elevated hemoglobin of 18.3, hematocrit of 54.6.  White count and platelet count were within normal limits.  Previously on 12/18/2018, his hemoglobin was 18.5, hematocrit 55.8.  Given persistent polycythemia, referral was sent to [place] for further evaluation.   The patient reports that this is a new finding and has never had high hemoglobin count in the past. The patient also mentions a significant weight loss of eight pounds in five days due to a suspected norovirus infection earlier in the year.   The patient's burning mouth syndrome has been persistent and has led to dietary changes, including avoiding spicy foods, soy, soft drinks, sugar, and bread. Despite these changes, the patient reports that the burning sensation is still present and affects his daily life.   The patient's interstitial lung disease and sleep apnea are reportedly under control with the use of a CPAP machine. However, the patient reports struggling with insomnia and only getting four to six hours of sleep per night.   The patient also mentions a history of multiple melanomas that have been surgically removed. The patient is also scheduled for a hernia repair, which has been postponed due to the current  investigation of his high hemoglobin count.  Family history of leukemia noted.    On his consultation with [place] on 02/15/2024, labs actually showed improved hemoglobin of 16.9, hematocrit 50.  White count and platelet count were within normal limits.  CMP, iron studies, LDH, erythropoietin levels were all unremarkable. JAK2 mutation with reflex testing for CALR, MPL, exon 12-15 mutations was also negative.  His ILD and CPAP use for sleep apnea may contribute to his secondary polycythemia.  Discussed risks of thrombosis, stroke, and cardiovascular complications due to increased blood viscosity.  Consider phlebotomy if hematocrit remains above 55 to minimize complications.   We will arrange for lab check in approximately 6 weeks and then see him in 3 months for follow-up with repeat labs.    REVIEW OF SYSTEMS:    Review of Systems - Oncology  All other pertinent systems were reviewed with the patient and are negative.  I have reviewed the past medical history, past surgical history, social history and family history with the patient and they are unchanged from previous note.  ALLERGIES:  He is allergic to trazodone and nefazodone.  MEDICATIONS:  Current Outpatient Medications  Medication Sig Dispense Refill   albuterol (VENTOLIN HFA) 108 (90 Base) MCG/ACT inhaler Inhale into the lungs every 6 (six) hours as needed for wheezing or shortness of breath.     Azelastine-Fluticasone (DYMISTA) 137-50 MCG/ACT SUSP Place into the nose.     diphenoxylate-atropine (LOMOTIL) 2.5-0.025 MG tablet Take 2 tablets by mouth 4 (four) times  daily as needed for diarrhea or loose stools. 60 tablet 0   Eszopiclone 3 MG TABS Take 1 tablet (3 mg total) by mouth at bedtime. Take immediately before bedtime 90 tablet 1   loperamide (IMODIUM) 2 MG capsule Take 1 capsule (2 mg total) by mouth 4 (four) times daily as needed for diarrhea or loose stools. (Patient not taking: Reported on 02/15/2024) 12 capsule 0    MECLIZINE HCL PO Take by mouth. (Patient not taking: Reported on 02/15/2024)     omeprazole (PRILOSEC) 40 MG capsule TAKE 1 CAPSULE (40 MG TOTAL) BY MOUTH DAILY. 90 capsule 3   ondansetron (ZOFRAN-ODT) 4 MG disintegrating tablet Take 1 tablet (4 mg total) by mouth every 8 (eight) hours as needed for nausea or vomiting. 20 tablet 0   pregabalin (LYRICA) 300 MG capsule Take 1 capsule (300 mg total) by mouth daily. 90 capsule 1   TESSALON PERLES 100 MG capsule Take 1 capsule (100 mg total) by mouth 3 (three) times daily as needed for cough. (Patient not taking: Reported on 02/15/2024) 20 capsule 0   zolpidem (AMBIEN) 10 MG tablet 1 at bedtime for sleep if needed 90 tablet 1   No current facility-administered medications for this visit.    PHYSICAL EXAMINATION:   Onc Performance Status - 02/24/24 1400       ECOG Perf Status   ECOG Perf Status Restricted in physically strenuous activity but ambulatory and able to carry out work of a light or sedentary nature, e.g., light house work, office work      KPS SCALE   KPS % SCORE Able to carry on normal activity, minor s/s of disease             LABORATORY DATA:   I have reviewed the data as listed.  Recent Results (from the past 2160 hours)  Lipase, blood     Status: None   Collection Time: 12/19/23 12:05 PM  Result Value Ref Range   Lipase 31 11 - 51 U/L    Comment: Performed at [facility], [address] [place], [place] [address]  Comprehensive metabolic panel     Status: Abnormal   Collection Time: 12/19/23 12:05 PM  Result Value Ref Range   Sodium 142 135 - 145 mmol/L   Potassium 3.7 3.5 - 5.1 mmol/L   Chloride 107 98 - 111 mmol/L   CO2 23 22 - 32 mmol/L   Glucose, Bld 120 (H) 70 - 99 mg/dL    Comment: Glucose reference range applies only to samples taken after fasting for at least 8 hours.   BUN 23 8 - 23 mg/dL   Creatinine, Ser 9.81 0.61 - 1.24 mg/dL   Calcium 9.6 8.9 - 19.1 mg/dL   Total Protein 8.4  (H) 6.5 - 8.1 g/dL   Albumin 4.2 3.5 - 5.0 g/dL   AST 23 15 - 41 U/L   ALT 21 0 - 44 U/L   Alkaline Phosphatase 66 38 - 126 U/L   Total Bilirubin 2.0 (H) 0.0 - 1.2 mg/dL   GFR, Estimated >47 >82 mL/min    Comment: (NOTE) Calculated using the CKD-EPI Creatinine Equation (2021)    Anion gap 12 5 - 15    Comment: Performed at [facility], [address], [place], [place] [address]  CBC     Status: Abnormal   Collection Time: 12/19/23 12:05 PM  Result Value Ref Range   WBC 14.5 (H) 4.0 - 10.5 K/uL  RBC 6.12 (H) 4.22 - 5.81 MIL/uL   Hemoglobin 18.5 (H) 13.0 - 17.0 g/dL   HCT 16.1 (H) 09.6 - 04.5 %   MCV 91.2 80.0 - 100.0 fL   MCH 30.2 26.0 - 34.0 pg   MCHC 33.2 30.0 - 36.0 g/dL   RDW 40.9 81.1 - 91.4 %   Platelets 342 150 - 400 K/uL   nRBC 0.0 0.0 - 0.2 %    Comment: Performed at [facility], [address], [place], [place] [address]  Resp panel by RT-PCR (RSV, Flu A&B, Covid) Anterior Nasal Swab     Status: None   Collection Time: 12/19/23  6:43 PM   Specimen: Anterior Nasal Swab  Result Value Ref Range   SARS Coronavirus 2 by RT PCR NEGATIVE NEGATIVE    Comment: (NOTE) SARS-CoV-2 target nucleic acids are NOT DETECTED.  The SARS-CoV-2 RNA is generally detectable in upper respiratory specimens during the acute phase of infection. The lowest concentration of SARS-CoV-2 viral copies this assay can detect is 138 copies/mL. A negative result does not preclude SARS-Cov-2 infection and should not be used as the sole basis for treatment or other patient management decisions. A negative result may occur with  improper specimen collection/handling, submission of specimen other than nasopharyngeal swab, presence of viral mutation(s) within the areas targeted by this assay, and inadequate number of viral copies(<138 copies/mL). A negative result must be combined with clinical observations, patient history, and epidemiological information. The  expected result is Negative.  Fact Sheet for Patients:  BloggerCourse.com  Fact Sheet for Healthcare Providers:  SeriousBroker.it  This test is no t yet approved or cleared by the [place] FDA and  has been authorized for detection and/or diagnosis of SARS-CoV-2 by FDA under an Emergency Use Authorization (EUA). This EUA will remain  in effect (meaning this test can be used) for the duration of the COVID-19 declaration under Section 564(b)(1) of the Act, 21 U.S.C.section 360bbb-3(b)(1), unless the authorization is terminated  or revoked sooner.       Influenza A by PCR NEGATIVE NEGATIVE   Influenza B by PCR NEGATIVE NEGATIVE    Comment: (NOTE) The Xpert Xpress SARS-CoV-2/FLU/RSV plus assay is intended as an aid in the diagnosis of influenza from Nasopharyngeal swab specimens and should not be used as a sole basis for treatment. Nasal washings and aspirates are unacceptable for Xpert Xpress SARS-CoV-2/FLU/RSV testing.  Fact Sheet for Patients: BloggerCourse.com  Fact Sheet for Healthcare Providers: SeriousBroker.it  This test is not yet approved or cleared by the [place] FDA and has been authorized for detection and/or diagnosis of SARS-CoV-2 by FDA under an Emergency Use Authorization (EUA). This EUA will remain in effect (meaning this test can be used) for the duration of the COVID-19 declaration under Section 564(b)(1) of the Act, 21 U.S.C. section 360bbb-3(b)(1), unless the authorization is terminated or revoked.     Resp Syncytial Virus by PCR NEGATIVE NEGATIVE    Comment: (NOTE) Fact Sheet for Patients: BloggerCourse.com  Fact Sheet for Healthcare Providers: SeriousBroker.it  This test is not yet approved or cleared by the [place] FDA and has been authorized for detection and/or diagnosis of  SARS-CoV-2 by FDA under an Emergency Use Authorization (EUA). This EUA will remain in effect (meaning this test can be used) for the duration of the COVID-19 declaration under Section 564(b)(1) of the Act, 21 U.S.C. section 360bbb-3(b)(1), unless the authorization is terminated or revoked.  Performed at [facility], [address], [place], [place] [address]   Urinalysis, Routine w reflex microscopic -Urine, Clean Catch     Status: Abnormal   Collection Time: 12/19/23  6:54 PM  Result Value Ref Range   Color, Urine YELLOW YELLOW   APPearance TURBID (A) CLEAR   Specific Gravity, Urine 1.029 1.005 - 1.030   pH 5.0 5.0 - 8.0   Glucose, UA NEGATIVE NEGATIVE mg/dL   Hgb urine dipstick NEGATIVE NEGATIVE   Bilirubin Urine SMALL (A) NEGATIVE   Ketones, ur NEGATIVE NEGATIVE mg/dL   Protein, ur 604 (A) NEGATIVE mg/dL   Nitrite NEGATIVE NEGATIVE   Leukocytes,Ua NEGATIVE NEGATIVE   RBC / HPF 0-5 0 - 5 RBC/hpf   WBC, UA 0-5 0 - 5 WBC/hpf   Bacteria, UA NONE SEEN NONE SEEN   Squamous Epithelial / HPF 0-5 0 - 5 /HPF   Mucus PRESENT     Comment: Performed at [facility], [address] [place], [place] [address]  C Difficile Quick Screen w PCR reflex     Status: None   Collection Time: 12/19/23  8:11 PM   Specimen: STOOL  Result Value Ref Range   C Diff antigen NEGATIVE NEGATIVE   C Diff toxin NEGATIVE NEGATIVE   C Diff interpretation No C. difficile detected.     Comment: Performed at [facility], [address] [place], [place] [address]  Gastrointestinal Panel by PCR , Stool     Status: None   Collection Time: 12/19/23  8:11 PM   Specimen: STOOL  Result Value Ref Range   Campylobacter species NOT DETECTED NOT DETECTED   Plesimonas shigelloides NOT DETECTED NOT DETECTED   Salmonella species NOT DETECTED NOT DETECTED   Yersinia enterocolitica NOT DETECTED NOT DETECTED   Vibrio species NOT DETECTED NOT DETECTED   Vibrio  cholerae NOT DETECTED NOT DETECTED   Enteroaggregative E coli (EAEC) NOT DETECTED NOT DETECTED   Enteropathogenic E coli (EPEC) NOT DETECTED NOT DETECTED   Enterotoxigenic E coli (ETEC) NOT DETECTED NOT DETECTED   Shiga like toxin producing E coli (STEC) NOT DETECTED NOT DETECTED   Shigella/Enteroinvasive E coli (EIEC) NOT DETECTED NOT DETECTED   Cryptosporidium NOT DETECTED NOT DETECTED   Cyclospora cayetanensis NOT DETECTED NOT DETECTED   Entamoeba histolytica NOT DETECTED NOT DETECTED   Giardia lamblia NOT DETECTED NOT DETECTED   Adenovirus F40/41 NOT DETECTED NOT DETECTED   Astrovirus NOT DETECTED NOT DETECTED   Norovirus GI/GII NOT DETECTED NOT DETECTED   Rotavirus A NOT DETECTED NOT DETECTED   Sapovirus (I, II, IV, and V) NOT DETECTED NOT DETECTED    Comment: Performed at [facility], [address] [place], [place] [address]  Hemoglobin A1c     Status: None   Collection Time: 01/27/24 10:47 AM  Result Value Ref Range   Hgb A1c MFr Bld 5.6 4.6 - 6.5 %    Comment: Glycemic Control Guidelines for People with Diabetes:Non Diabetic:  <6%Goal of Therapy: <7%Additional Action Suggested:  >8%   CBC with Differential/Platelet     Status: Abnormal   Collection Time: 01/27/24 10:47 AM  Result Value Ref Range   WBC 7.0 4.0 - 10.5 K/uL   RBC 5.94 (H) 4.22 - 5.81 Mil/uL   Hemoglobin 18.3 Repeated and verified X2. (HH) 13.0 - 17.0 g/dL   HCT 62.1 (H) 30.8 - 65.7 %   MCV 91.9 78.0 - 100.0 fl   MCHC 33.5 30.0 - 36.0 g/dL   RDW 84.6 96.2 - 95.2 %  Platelets 269.0 150.0 - 400.0 K/uL   Neutrophils Relative % 64.9 43.0 - 77.0 %   Lymphocytes Relative 22.7 12.0 - 46.0 %   Monocytes Relative 8.3 3.0 - 12.0 %   Eosinophils Relative 3.3 0.0 - 5.0 %   Basophils Relative 0.8 0.0 - 3.0 %   Neutro Abs 4.5 1.4 - 7.7 K/uL   Lymphs Abs 1.6 0.7 - 4.0 K/uL   Monocytes Absolute 0.6 0.1 - 1.0 K/uL   Eosinophils Absolute 0.2 0.0 - 0.7 K/uL   Basophils Absolute 0.1 0.0 - 0.1 K/uL  Vitamin B12      Status: None   Collection Time: 01/30/24  9:25 AM  Result Value Ref Range   Vitamin B-12 288 211 - 911 pg/mL  Basic metabolic panel     Status: None   Collection Time: 01/30/24  9:25 AM  Result Value Ref Range   Sodium 138 135 - 145 mEq/L   Potassium 4.2 3.5 - 5.1 mEq/L   Chloride 104 96 - 112 mEq/L   CO2 27 19 - 32 mEq/L   Glucose, Bld 92 70 - 99 mg/dL   BUN 13 6 - 23 mg/dL   Creatinine, Ser 1.91 0.40 - 1.50 mg/dL   GFR 47.82 >95.62 mL/min    Comment: Calculated using the CKD-EPI Creatinine Equation (2021)   Calcium 9.4 8.4 - 10.5 mg/dL  TSH     Status: None   Collection Time: 01/30/24  9:25 AM  Result Value Ref Range   TSH 3.55 0.35 - 5.50 uIU/mL  Hepatic function panel     Status: None   Collection Time: 01/30/24  9:25 AM  Result Value Ref Range   Total Bilirubin 0.8 0.2 - 1.2 mg/dL   Bilirubin, Direct 0.2 0.0 - 0.3 mg/dL   Alkaline Phosphatase 60 39 - 117 U/L   AST 16 0 - 37 U/L   ALT 18 0 - 53 U/L   Total Protein 6.9 6.0 - 8.3 g/dL   Albumin 3.9 3.5 - 5.2 g/dL  PSA     Status: None   Collection Time: 01/30/24  9:25 AM  Result Value Ref Range   PSA 1.56 0.10 - 4.00 ng/mL    Comment: Test performed using Access Hybritech PSA Assay, a parmagnetic partical, chemiluminecent immunoassay.  Lipid panel     Status: Abnormal   Collection Time: 01/30/24  9:25 AM  Result Value Ref Range   Cholesterol 105 0 - 200 mg/dL    Comment: ATP III Classification       Desirable:  < 200 mg/dL               Borderline High:  200 - 239 mg/dL          High:  > = 130 mg/dL   Triglycerides 865.7 0.0 - 149.0 mg/dL    Comment: Normal:  <846 mg/dLBorderline High:  150 - 199 mg/dL   HDL 96.29 (L) >52.84 mg/dL   VLDL 13.2 0.0 - 44.0 mg/dL   LDL Cholesterol 48 0 - 99 mg/dL   Total CHOL/HDL Ratio 3     Comment:                Men          Women1/2 Average Risk     3.4          3.3Average Risk          5.0          4.42X Average Risk  9.6          7.13X Average Risk          15.0           11.0                       NonHDL 72.92     Comment: NOTE:  Non-HDL goal should be 30 mg/dL higher than patient's LDL goal (i.e. LDL goal of < 70 mg/dL, would have non-HDL goal of < 100 mg/dL)  CBC with Differential (Cancer Center Only)     Status: None   Collection Time: 02/15/24 10:02 AM  Result Value Ref Range   WBC Count 6.6 4.0 - 10.5 K/uL   RBC 5.52 4.22 - 5.81 MIL/uL   Hemoglobin 16.9 13.0 - 17.0 g/dL   HCT 16.1 09.6 - 04.5 %   MCV 90.6 80.0 - 100.0 fL   MCH 30.6 26.0 - 34.0 pg   MCHC 33.8 30.0 - 36.0 g/dL   RDW 40.9 81.1 - 91.4 %   Platelet Count 259 150 - 400 K/uL   nRBC 0.0 0.0 - 0.2 %   Neutrophils Relative % 59 %   Neutro Abs 3.9 1.7 - 7.7 K/uL   Lymphocytes Relative 27 %   Lymphs Abs 1.8 0.7 - 4.0 K/uL   Monocytes Relative 9 %   Monocytes Absolute 0.6 0.1 - 1.0 K/uL   Eosinophils Relative 4 %   Eosinophils Absolute 0.3 0.0 - 0.5 K/uL   Basophils Relative 1 %   Basophils Absolute 0.1 0.0 - 0.1 K/uL   Immature Granulocytes 0 %   Abs Immature Granulocytes 0.02 0.00 - 0.07 K/uL    Comment: Performed at [facility], [address], [place], [place] [address]  CMP (Cancer Center only)     Status: None   Collection Time: 02/15/24 10:02 AM  Result Value Ref Range   Sodium 139 135 - 145 mmol/L   Potassium 4.1 3.5 - 5.1 mmol/L   Chloride 106 98 - 111 mmol/L   CO2 28 22 - 32 mmol/L   Glucose, Bld 93 70 - 99 mg/dL    Comment: Glucose reference range applies only to samples taken after fasting for at least 8 hours.   BUN 13 8 - 23 mg/dL   Creatinine 6.21 3.08 - 1.24 mg/dL   Calcium 9.2 8.9 - 65.7 mg/dL   Total Protein 7.6 6.5 - 8.1 g/dL   Albumin 4.0 3.5 - 5.0 g/dL   AST 19 15 - 41 U/L   ALT 19 0 - 44 U/L   Alkaline Phosphatase 67 38 - 126 U/L   Total Bilirubin 0.9 0.0 - 1.2 mg/dL   GFR, Estimated >84 >69 mL/min    Comment: (NOTE) Calculated using the CKD-EPI Creatinine Equation (2021)    Anion gap 5 5 - 15    Comment: Performed at [facility], [address] [place], [place] [address]  Iron and Iron Binding Capacity ([facility] only)     Status: None   Collection Time: 02/15/24 10:02 AM  Result Value Ref Range   Iron 92 45 - 182 ug/dL   TIBC 841 324 - 401 ug/dL   Saturation Ratios 28 17.9 - 39.5 %   UIBC 240 117 - 376 ug/dL    Comment: Performed at [facility], [address], [place], [place] [address]  Ferritin     Status: None  Collection Time: 02/15/24 10:02 AM  Result Value Ref Range   Ferritin 147 24 - 336 ng/mL    Comment: Performed at [facility], [address], [place], [place] [address]  Lactate dehydrogenase     Status: None   Collection Time: 02/15/24 10:02 AM  Result Value Ref Range   LDH 149 98 - 192 U/L    Comment: Performed at [facility], [address] [place], [place] [address]  JAK2 V617F rfx CALR/MPL/E12-15     Status: None   Collection Time: 02/15/24 10:02 AM  Result Value Ref Range   Specimen Type Comment:     Comment: NOT PROVIDED CORRECTED ON 04/02 AT 1235: PREVIOUSLY REPORTED AS PERIPHERAL BLOOD SAMPLE    JAK2 V617F Result Comment     Comment: (NOTE) NEGATIVE The JAK2 V617F mutation is not detected in the provided specimen of this individual. Results should be interpreted in conjunction with clinical and other laboratory findings for the most accurate interpretation. This test was developed and its performance characteristics determined by [facility]. It has not been cleared or approved by the Food and Drug Administration.    Reflex Comment     Comment: (NOTE) Reflex to CALR Mutation Analysis, JAK2 Exon 12-15 Mutation Analysis, and MPL Mutation Analysis is indicated.    V617F Rfx CALR/MPL/E12-15 Bkgd Comment     Comment: (NOTE)    Molecular testing of blood or bone marrow is useful in the evaluation of suspected myeloproliferative neoplasms (MPN). Mutations in the JAK2, MPL, and  CALR genes are present in virtually all MPNs and their presence help distinguish benign reactive processes from clonal neoplasms. These mutations are generally considered mutually exclusive, although concurrent clones have been reported in rare patients. This test will assess for the JAK2V617F (exon 14) mutation first and will reflex to CALR mutation analysis, MPL mutation analysis, and JAK2 exon 12 to 15 mutation analysis if the JAK2V617F mutation is negative.    The JAK2 (Janus kinase 2) gene encodes for a non-receptor protein tyrosine kinase that activates cytokine and growth factor signaling. The V617F (c.1849 G>T) mutation results in constitutive activation of JAK2 and downstream STAT5 and ERK signaling. The V617F mutation is observed in approximately 95% of polycythemia vera (PV), 60% of essential thromboc ythemia (ET) and primary myelofibrosis (PMF). It is also infrequently present (3-5%) in myelodysplastic syndrome, chronic myelomonocytic leukemia, and other atypical chronic myeloid disorders. A small percentage of JAK2 mutation positive patients (3.3%) contain other non-V617F mutations within exons 12 to 15. In particular, mutations in exon 12 of JAK2 have been described in approximately 3% of patients with PV. JAK2 allele burden correlates with clinical phenotype, with low levels of mutant allele characterized by thrombocytosis, intermediate levels with erythrocytosis, and high mutant allele burden correlating with enhanced myelopoiesis of the BM, leukocytosis, increasing spleen size, and circulating CD34-positive cells.    The CALR (Calreticulin) gene encodes for a multifunctional calcium-binding protein involved in many cellular activities such as growth, proliferation, adhesion, and programmed cell death. Among patients with JAK2 negative MPNs, CALR are found in a pproximately 70% of patients with JAK2-negative essential thrombocythemia (ET) and 60-88% of patients with  JAK2-negative primary myelofibrosis(PMF). Only a minority of patients (approximately 8%) with myelodysplasia have mutations in the CALR gene. CALR mutations are rarely detected in patients with de novo acute myeloid leukemia, chronic myelogenous leukemia, lymphoid leukemia, or solid tumors. CALR mutations are not detected in polycythemia and generally appear to be mutually exclusive with JAK2 mutations and  MPL mutations. The majority of mutational changes involve a variety of insertion deletion mutations in exon 9 of the calreticulin gene: approximately 53% of all CALR mutations are a 52 bp deletion (type-1) while the second most prevalent mutation (approximately 32%) contains a 5 bp insertion (type-2). Other mutations (non-type 1 or type 2) are seen in a small minority of cases. CALR mutations in PMF tend to be with a favorable prognosis compared to JAK2 V617F [employer], whereas primary myelofibrosis negative for CALR, JAK2 V617F and MPL mutations (so-called triple negative) is associated with a poor prognosis and shorter survival.    The MPL (myeloproliferative leukemia virus oncogene) gene encodes the thrombopoietin receptor which regulates hematopoiesis and megakaryopoiesis. Activating MPL mutations are associated with a subset of myeloproliferative neoplasms and acute megakaryoblastic leukemia. MPL W515 mutations are present in approximately 5-8% of patients with primary myelofibrosis (PMF) and 1-4% of patients with essential thrombocythemia (ET). The S505 mutation is detected in patients with hereditary thrombocythemia.    Limitations    This assay has a sensitivity of approximately 1% VAF for JAK2 V617F, 2.5% VAF for other mutations in JAK2 exons 12 to 15, CALR mutations, and MPL mutations. Deletions in JAK2 up to 6 bp and insertions up to 34 bp have been detected in validation studies. Deletions in CALR up to 70 bp and i nsertions up to 12 bp have been detected in validation  studies.    Method based next generation sequencing.     Comment: Comment [employer]plicon    References Comment     Comment: (NOTE) Alghasham N, Alnouri Y, Abalkhail H, Clarita Leber. Detection of mutations in JAK2 exons 12-15 by [employer] sequencing. Int J Lab Hematol. 2016 Feb;38(1):34-41. doi: 10.1111/ijlh.16109. Epub 2015 Sep 11. PMID: 60454098. Pura Spice, Unk Lightning, Hasserjian R, Patrica Duel, Borowitz MJ, Leroy Libman MM, [place] CD, [place], Vardiman JW. The 2016 revision to the World Health Organization classification of myeloid neoplasms and acute leukemia. Blood. 2016 May 19;127(20):2391-405. doi: 10.1182/blood-2016-03-643544. Epub 2016 Apr 11. PMID: 11914782. Genevie Ann [facility], Zhang ZJ, Milford S, Albitar M. Mutation profile of JAK2 transcripts in patients with chronic myeloproliferative neoplasias. J Mol Diagn. 2009 Jan;11(1):49-53.doi: 10.2353/jmoldx.2009.080114. Epub 2008 Dec 12. PMID: 95621308; PMCID: MVH8469629. NCCN Clinical Practice Guidelines in Oncology (NCCN Guidelines) Myeloproliferative Neoplasms Version 3.2022 - July 02, 2021. Swerdlow SH, Programmer, multimedia. WHO classif ication of Tumours of Haematopoietic and Lymphoid Tissues. 4th edn. Jaci Standard, [place]: Geologist, engineering for [employer] on [employer]; 2017. Tefferi A. Primary myelofibrosis: 2021 update on diagnosis, risk-stratification and management. Am J Hematol. 2021 Jan;96(1):145-162. doi: 10.1002/ajh.26050. Epub 2020 Dec 2. PMID: 52841324. Royetta Car, Kralovics R. Genetic basis and molecular pathophysiology of classical myeloproliferative neoplasms. Blood. 2017 Feb 9;129(6):667-679. doi: 10.1182/blood-2016-10-695940. Epub 2016 Dec 27. PMID: 40102725.    Director Review Comment     Comment: (NOTE) Mellody Life, PhD, [facility]    Director, Molecular Oncology    [facility]    Research [place], [place] [address]    [id number] Performed At: [facility] [address]  [place], [place] [id number] Maurine Simmering MDPhD [id number] Performed At: [facility] [address] [place], [place] [id number] Maurine Simmering MDPhD [id number]   Erythropoietin     Status: None   Collection Time: 02/15/24 10:02 AM  Result Value Ref Range   Erythropoietin 8.3 2.6 - 18.5 mIU/mL    Comment: (NOTE) Beckman Coulter Uni[employer]el DxI 800 Immunoassay System Values obtained with different assay  methods or kits cannot be used interchangeably. Results cannot be interpreted as absolute evidence of the presence or absence of malignant disease. Performed At: [facility] [address] [place], [place] [id number] Jolene Schimke MD WU:[id number]   CALR +MPL + E12-E15 (reflexed)     Status: None   Collection Time: 02/15/24 10:02 AM  Result Value Ref Range   CALR Result Comment     Comment: (NOTE) NEGATIVE No insertions or deletions were detected within the analyzed region of the calreticulin (CALR) gene. A negative result does not entirely exclude the possibility of a clonal population carrying CALR gene mutations that are not covered by this assay. Results should be interpreted in conjunction with clinical and laboratory findings for the most accurate interpretation.    MPL Result Comment     Comment: (NOTE) NEGATIVE No MPL mutation was identified in the provided specimen of this individual. Results should be interpreted in conjunction with clinical and other laboratory findings for the most accurate interpretation.    E12-15 Result Comment     Comment: (NOTE) NEGATIVE    JAK2 mutations were not detected in exons 12, 13, 14 and 15. The G to T nucleotide change encoding the V617F mutation was not detected. This result does not rule out the presence of JAK2 mutation at a level below the detection sensitivity of this assay, the presence of other mutations outside the analyzed region of the JAK2 gene, or the presence of a myeloproliferative or other neoplasm. Result  must be correlated with other clinical data for the most accurate diagnosis. Performed At: [facility] [address] [place], [place] [id number] Maurine Simmering MDPhD KV:[id number]      RADIOGRAPHIC STUDIES:  No recent pertinent imaging studies available to review.  Orders Placed This Encounter  Procedures   Hemoglobin and Hematocrit (Cancer Center Only)    Standing Status:   Future    Expected Date:   04/06/2024    Expiration Date:   02/23/2025   CBC with Differential (Cancer Center Only)    Standing Status:   Future    Expected Date:   05/17/2024    Expiration Date:   02/23/2025     Future Appointments  Date Time Provider Department Center  02/28/2024  1:45 PM Nelwyn Salisbury, MD [facility]  05/17/2024  8:00 AM [facility] None  05/17/2024  8:30 AM Sreshta Cressler, Archie Patten, MD [facility] None  10/02/2024  2:30 PM Jetty Duhamel D, MD [facility] None  12/05/2024  8:00 AM [facility]AL WE[facility] VISIT [facility]    This document was completed utilizing speech recognition software. Grammatical errors, random word insertions, pronoun errors, and incomplete sentences are an occasional consequence of this system due to software limitations, ambient noise, and hardware issues. Any formal questions or concerns about the content, text or information contained within the body of this dictation should be directly addressed to the provider for clarification.

## 2024-02-24 NOTE — Telephone Encounter (Signed)
 Patient scheduled appointments. Patient is aware of all appointment details.

## 2024-02-24 NOTE — Assessment & Plan Note (Addendum)
 Elevated hemoglobin levels with recent values of 18.5 and 18.3.   On his consultation with [place] on 02/15/2024, labs actually showed improved hemoglobin of 16.9, hematocrit 50.  White count and platelet count were within normal limits.  CMP, iron studies, LDH, erythropoietin levels were all unremarkable. JAK2 mutation with reflex testing for CALR, MPL, exon 12-15 mutations was also negative.  His ILD and CPAP use for sleep apnea may contribute to his secondary polycythemia.  Discussed risks of thrombosis, stroke, and cardiovascular complications due to increased blood viscosity.  Consider phlebotomy if hematocrit remains above 55 to minimize complications.   We will arrange for lab check in approximately 6 weeks and then see him in 3 months for follow-up with repeat labs.

## 2024-02-24 NOTE — Assessment & Plan Note (Signed)
 Multiple melanomas on the shoulder and face, previously excised. No current active lesions reported.

## 2024-02-28 ENCOUNTER — Encounter: Payer: Self-pay | Admitting: Family Medicine

## 2024-02-28 ENCOUNTER — Telehealth (INDEPENDENT_AMBULATORY_CARE_PROVIDER_SITE_OTHER): Admitting: Family Medicine

## 2024-02-28 VITALS — BP 149/89 | HR 69

## 2024-02-28 DIAGNOSIS — G510 Bell's palsy: Secondary | ICD-10-CM | POA: Diagnosis not present

## 2024-02-28 DIAGNOSIS — B0229 Other postherpetic nervous system involvement: Secondary | ICD-10-CM | POA: Insufficient documentation

## 2024-02-28 MED ORDER — MECLIZINE HCL 25 MG PO TABS: 25.0000 mg | ORAL_TABLET | ORAL | 3 refills | Status: DC | PRN

## 2024-02-28 MED ORDER — METHYLPREDNISOLONE 4 MG PO TBPK: ORAL_TABLET | ORAL | 0 refills | Status: DC

## 2024-02-28 NOTE — Progress Notes (Signed)
 Subjective:    Patient ID: Michael Bernard, male    DOB: Sep 15, 1956, 68 y.o.   MRN: [id number]  HPI Virtual Visit via Video Note  I connected with the patient on 02/28/24 at  1:45 PM EDT by a video enabled telemedicine application and verified that I am speaking with the correct person using two identifiers.  Location patient: home Location provider:work or home office Persons participating in the virtual visit: patient, provider  I discussed the limitations of evaluation and management by telemedicine and the availability of in person appointments. The patient expressed understanding and agreed to proceed.   HPI: Here for several issues. First he thinks he has Bell's palsy again. He had this on the left side of his face about 15 years ago. Now for the past 3 days he has similar symptoms on the right side of his face. His right eyelids droop and the right side of his face droops. The skin feels a bit numb as well. No change in vision. No other neurologic deficits. No headache. Also we treated him for shingles in the right hand a month ago with 7 days of Valtrex. Now he still has a burning pain in the hand. He still takes Lyrica.    ROS: See pertinent positives and negatives per HPI.  Past Medical History:  Diagnosis Date   Anxiety    situational anxiety   Arthritis    LEFT knee   Asthma    Cancer (HCC), Melanoma    skin x 3-4 times   GERD (gastroesophageal reflux disease)    on meds   Hiatal hernia    Kidney stones    Multiple gastric ulcers 2015   seen on endoscopy per Dr. Chales Abrahams in [place]    Seasonal allergies    Sleep apnea    sees Dr. Fannie Knee, uses CPAP     Past Surgical History:  Procedure Laterality Date   COLONOSCOPY  07/20/2022   per Dr. Lynann Bologna, adenomatous polyps, repeat in 3 yrs   HERNIA REPAIR Bilateral    inguinal hernias repaired with mesh   KNEE SURGERY Left    arthritis and meniscal tear   LEFT HEART CATH AND CORONARY ANGIOGRAPHY N/A  05/22/2021   Procedure: LEFT HEART CATH AND CORONARY ANGIOGRAPHY;  Surgeon: Kathleene Hazel, MD;  [facility];  Service: Cardiovascular;  Laterality: N/A;   MELANOMA EXCISION     per Dr. Mayford Knife in [place], 3 removed from both shoulders and left temple    POLYPECTOMY  2017   TA    Family History  Problem Relation Age of Onset   Colon polyps Mother    Arthritis Mother    Cancer Mother    Hyperlipidemia Mother    Hypertension Mother    Leukemia Mother    Atrial fibrillation Father    Heart attack Father    Heart disease Father    Hypertension Father    Hypertension Sister    Seizures Sister    Colon cancer Neg Hx    Esophageal cancer Neg Hx    Stomach cancer Neg Hx    Rectal cancer Neg Hx      Current Outpatient Medications:    albuterol (VENTOLIN HFA) 108 (90 Base) MCG/ACT inhaler, Inhale into the lungs every 6 (six) hours as needed for wheezing or shortness of breath., Disp: , Rfl:    Azelastine-Fluticasone (DYMISTA) 137-50 MCG/ACT SUSP, Place into the nose., Disp: , Rfl:    loperamide (IMODIUM) 2  MG capsule, Take 1 capsule (2 mg total) by mouth 4 (four) times daily as needed for diarrhea or loose stools., Disp: 12 capsule, Rfl: 0   MECLIZINE HCL PO, Take by mouth., Disp: , Rfl:    omeprazole (PRILOSEC) 40 MG capsule, TAKE 1 CAPSULE (40 MG TOTAL) BY MOUTH DAILY., Disp: 90 capsule, Rfl: 3   pregabalin (LYRICA) 300 MG capsule, Take 1 capsule (300 mg total) by mouth daily., Disp: 90 capsule, Rfl: 1   TESSALON PERLES 100 MG capsule, Take 1 capsule (100 mg total) by mouth 3 (three) times daily as needed for cough., Disp: 20 capsule, Rfl: 0   zolpidem (AMBIEN) 10 MG tablet, 1 at bedtime for sleep if needed, Disp: 90 tablet, Rfl: 1  EXAM:  VITALS per patient if applicable:  GENERAL: alert, oriented, appears well and in no acute distress  HEENT: atraumatic, conjunttiva clear, no obvious abnormalities on inspection of external nose and ears  NECK:  normal movements of the head and neck  LUNGS: on inspection no signs of respiratory distress, breathing rate appears normal, no obvious gross SOB, gasping or wheezing  CV: no obvious cyanosis  PSYCH/NEURO: pleasant and cooperative, no obvious depression or anxiety, speech and thought processing grossly intact. He cannot fully open his right eye and the right side of his mouth droops   ASSESSMENT AND PLAN: He has another bout of Bell's palsy, so we will treat with a Medrol dose pack. He will follow up in 2 weeks. He also has post-herpetic neuropathy, and the steroid should help this as well.  Gershon Crane, MD  Discussed the following assessment and plan:  No diagnosis found.     I discussed the assessment and treatment plan with the patient. The patient was provided an opportunity to ask questions and all were answered. The patient agreed with the plan and demonstrated an understanding of the instructions.   The patient was advised to call back or seek an in-person evaluation if the symptoms worsen or if the condition fails to improve as anticipated.      Review of Systems     Objective:   Physical Exam        Assessment & Plan:

## 2024-03-12 ENCOUNTER — Ambulatory Visit (HOSPITAL_BASED_OUTPATIENT_CLINIC_OR_DEPARTMENT_OTHER)
Admission: EM | Admit: 2024-03-12 | Discharge: 2024-03-12 | Disposition: A | Discharge status: Home / Self Care | Attending: Family Medicine | Admitting: Family Medicine

## 2024-03-12 ENCOUNTER — Encounter (HOSPITAL_BASED_OUTPATIENT_CLINIC_OR_DEPARTMENT_OTHER): Payer: Self-pay

## 2024-03-12 DIAGNOSIS — R109 Unspecified abdominal pain: Secondary | ICD-10-CM | POA: Diagnosis not present

## 2024-03-12 LAB — POCT URINALYSIS DIP (MANUAL ENTRY)
Bilirubin, UA: NEGATIVE
Blood, UA: NEGATIVE
Glucose, UA: NEGATIVE mg/dL
Ketones, POC UA: NEGATIVE mg/dL
Leukocytes, UA: NEGATIVE
Nitrite, UA: NEGATIVE
Spec Grav, UA: 1.02 (ref 1.010–1.025)
Urobilinogen, UA: 1 U/dL
pH, UA: 7 (ref 5.0–8.0)

## 2024-03-12 MED ORDER — KETOROLAC TROMETHAMINE 30 MG/ML IJ SOLN
30.0000 mg | Freq: Once | INTRAMUSCULAR | Status: AC
  Administered 2024-03-12: 30 mg via INTRAMUSCULAR

## 2024-03-12 NOTE — Discharge Instructions (Signed)
 I am treating you for a potential kidney stone.  I given you some Toradol  here for pain. You can take Tylenol  in addition if needed. Recommend heat to the area If symptoms  worsening you need to go to the ER.

## 2024-03-12 NOTE — ED Triage Notes (Signed)
 Sunday morning felt sharp pain to right flank area. States pain is tolerable when sitting still. No radiation of pain into buttock or down leg. Denies known injury. Originally thought was a kidney stone. States hasn't progressed like his kidney stones do. Recently had shingles on right hand and bells palsy that affected his right side of face. Using biofreeze and took a family member's tramadol which has helped some.

## 2024-03-12 NOTE — ED Provider Notes (Signed)
 [facility]    CSN: [id number] Arrival date & time: 03/12/24  1133      History   Chief Complaint Chief Complaint  Patient presents with   Back Pain    HPI Alexandro Line is a 68 y.o. male.   Patient is a 68 year old male who presents today with right flank pain.  This started yesterday morning.  The pain comes and goes with certain movements.  The pain is sharp and stabbing.  Denies any radiation of pain, numbness or tingling.  Does have history of kidney stone many years ago on the left side.  Took a tramadol which helped some with his pain.  Denies any urinary symptoms, fever, chills, nausea or vomiting   Back Pain   Past Medical History:  Diagnosis Date   Anxiety    situational anxiety   Arthritis    LEFT knee   Asthma    Cancer (HCC), Melanoma    skin x 3-4 times   GERD (gastroesophageal reflux disease)    on meds   Hiatal hernia    Kidney stones    Multiple gastric ulcers 2015   seen on endoscopy per Dr. Venice Gillis in [place]    Seasonal allergies    Sleep apnea    sees Dr. 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 Lajuan Pila, adenomatous polyps, repeat in 3 yrs   HERNIA REPAIR Bilateral    inguinal hernias repaired with mesh   KNEE SURGERY Left    arthritis and meniscal tear   LEFT HEART CATH AND CORONARY ANGIOGRAPHY N/A 05/22/2021   Procedure: LEFT HEART CATH AND CORONARY ANGIOGRAPHY;  Surgeon: Odie Benne, MD;  Location: [facility];  Service: Cardiovascular;  Laterality: N/A;   MELANOMA EXCISION     per Dr. Broadus Canes in [place], 3 removed from both shoulders and left temple    POLYPECTOMY  2017   TA       Home Medications    Prior to Admission medications   Medication Sig Start Date End Date Taking? Authorizing Provider  albuterol (VENTOLIN HFA) 108 (90 Base) MCG/ACT inhaler Inhale into the lungs every 6 (six) hours as needed for wheezing or shortness of breath.    [provider]  Azelastine -Fluticasone  (DYMISTA ) 137-50 MCG/ACT SUSP Place into the nose.    [provider]  loperamide  (IMODIUM ) 2 MG capsule Take 1 capsule (2 mg total) by mouth 4 (four) times daily as needed for diarrhea or loose stools. 12/19/23   Kommor, [place], MD  meclizine  (ANTIVERT ) 25 MG tablet Take 1 tablet (25 mg total) by mouth every  4 (four) hours as needed for dizziness. 02/28/24   Donley Furth, MD  MECLIZINE  HCL PO Take by mouth.    [provider]  methylPREDNISolone  (MEDROL  DOSEPAK) 4 MG TBPK tablet As directed 02/28/24   Donley Furth, MD  omeprazole  (PRILOSEC) 40 MG capsule TAKE 1 CAPSULE (40 MG TOTAL) BY MOUTH DAILY. 10/06/23   Donley Furth, MD  pregabalin  (LYRICA ) 300 MG capsule Take 1 capsule (300 mg total) by mouth daily. 12/21/23   Donley Furth, MD  TESSALON  PERLES 100 MG capsule Take 1 capsule (100 mg total) by mouth 3 (three) times daily as needed for cough. 11/12/22   Ardie Kras, FNP  zolpidem  (AMBIEN ) 10 MG tablet 1 at bedtime for sleep if needed 12/22/23   Faustina Hood, MD    Family History Family History  Problem Relation Age of  Onset   Colon polyps Mother    Arthritis Mother    Cancer Mother    Hyperlipidemia Mother    Hypertension Mother    Leukemia Mother    Atrial fibrillation Father    Heart attack Father    Heart disease Father    Hypertension Father    Hypertension Sister    Seizures Sister    Colon cancer Neg Hx    Esophageal cancer Neg Hx    Stomach cancer Neg Hx    Rectal cancer Neg Hx     Social History Social History   Tobacco Use   Smoking status: Former    Current packs/day: 0.00    Types: Cigarettes    Start date: 06/10/1976    Quit date: 06/11/1979    Years since quitting: 44.7   Smokeless tobacco: Never   Tobacco comments:    pt reports smoker 0.5 pack/month as a teenager 06/11/2019  Vaping Use   Vaping status: Never Used  Substance Use Topics   Alcohol use: Yes    Alcohol/week: 1.0 - 2.0 standard drink of alcohol    Types: 1 - 2 Cans of beer per week    Comment: 1-2 beers per month   Drug use: Never     Allergies   Trazodone  and nefazodone   Review of Systems Review of Systems  Musculoskeletal:  Positive for back pain.     Physical Exam Triage Vital Signs ED Triage Vitals  Encounter Vitals Group     BP 03/12/24 1149 134/85     Systolic BP Percentile --      Diastolic BP Percentile --      Pulse Rate 03/12/24 1149 67     Resp 03/12/24 1149 20     Temp 03/12/24 1149 98.6 F (37 C)     Temp Source 03/12/24 1149 Oral     SpO2 03/12/24 1149 98 %     Weight --      Height --      Head Circumference --      Peak Flow --      Pain Score 03/12/24 1152 5     Pain Loc --      Pain Education --      Exclude from Growth Chart --    No data found.  Updated Vital Signs BP 134/85 (BP Location: Right Arm)   Pulse 67   Temp 98.6 F (37 C) (Oral)   Resp 20   SpO2 98%   Visual Acuity Right Eye Distance:   Left Eye Distance:   Bilateral Distance:    Right Eye Near:  Left Eye Near:    Bilateral Near:     Physical Exam Vitals and nursing note reviewed.   Constitutional:      General: He is not in acute distress.    Appearance: Normal appearance. He is not ill-appearing or toxic-appearing.  Pulmonary:     Effort: Pulmonary effort is normal.  Abdominal:     Tenderness: There is right CVA tenderness.  Neurological:     Mental Status: He is alert.      UC Treatments / Results  Labs (all labs ordered are listed, but only abnormal results are displayed) Labs Reviewed  POCT URINALYSIS DIP (MANUAL ENTRY) - Abnormal; Notable for the following components:      Result Value   Protein Ur, POC trace (*)    All other components within normal limits    EKG   Radiology No results found.  Procedures Procedures (including critical care time)  Medications Ordered in UC Medications  ketorolac  (TORADOL ) 30 MG/ML injection 30 mg (30 mg Intramuscular Given 03/12/24 1238)    Initial Impression / Assessment and Plan / UC Course  I have reviewed the triage vital signs and the nursing notes.  Pertinent labs & imaging results that were available during my care of the patient were reviewed by me and considered in my medical decision making (see chart for details).     Flank pain- urine was negative for infection and blood. ? Kidney stone versus muscle strain/spasm.  Toradol  given here for pain. Can take tylenol  for pain at home as needed.  Apply heat to the area.  ER for worsening symptoms.  Final Clinical Impressions(s) / UC Diagnoses   Final diagnoses:  Flank pain     Discharge Instructions      I am treating you for a potential kidney stone.  I given you some Toradol  here for pain. You can take Tylenol  in addition if needed. Recommend heat to the area If symptoms  worsening you need to go to the ER.    ED Prescriptions   None    PDMP not reviewed this encounter.   Landa Pine, FNP 03/12/24 [phone]

## 2024-03-21 ENCOUNTER — Encounter: Payer: Self-pay | Admitting: Pulmonary Disease

## 2024-03-21 ENCOUNTER — Ambulatory Visit: Admitting: Pulmonary Disease

## 2024-03-21 VITALS — BP 124/80 | HR 59 | Ht 67.0 in | Wt 215.2 lb

## 2024-03-21 DIAGNOSIS — J842 Lymphoid interstitial pneumonia: Secondary | ICD-10-CM

## 2024-03-21 DIAGNOSIS — Z87891 Personal history of nicotine dependence: Secondary | ICD-10-CM

## 2024-03-21 NOTE — Progress Notes (Signed)
 @Patient  ID: Michael Bernard, male    DOB: 07/01/1956, 68 y.o.   MRN: [id number]  Chief Complaint  Patient presents with   Follow-up    ILD followup- SOB increased with activity    Referring provider: Donley Furth, MD  HPI:   68 y.o. man we are seeing for evaluation of shortness of breath, cough as well as cystic-like lesions on CT scan, most likely LIP.  Multiple pulmonary notes from [facility] reviewed.  Doing okay.  [facility] feels this is LIP but no further intervention recommended.  Breathing seems relatively stable.  He has had a lot of issues with Bell's palsy possible shingles on the right hand, also admitted to the hospital with what sounds like norovirus.  We discussed serial monitoring lungs for test.  He has expressed understanding agrees.   HPI at initial visit: Patient describes onset of dyspnea on exertion starting around the time he retired.  Couple years ago.  Started prior to retirement.  Previously, during his work, he walked 10+ miles a day.  Likely even more.  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  But again, for the most of his career he did not have any shortness of breath.  Walked quite extensively, was very active.  Since retirement over the last couple years progressive dyspnea as described.  On exertion.  Worse on inclines or stairs in particular.  No time of day when things are better or worse.  No position make things better or worse.  No other relieving or exacerbating factors.  Has tried albuterol but does not think it is helped very much.  He does note he had several sinus infections, viral infections over the last few weeks to months which limits his ability to assess the effectiveness.  In addition, he describes a productive cough.  Present for years.  Possibly for a decade or more.  Productive of phlegm.  Worse in the mornings shortly after waking up.  He does endorse nasal  congestion.  Denies much rhinorrhea or postnasal drip.  He reports a hiatal hernia.  History of heartburn.  Although does not think his symptoms are very bad..  Recently he was prescribed Arnuity for presumed asthma given productive cough and shortness of breath.  He is yet to start this.  It sounds like his PCP was concerned for possible stroke in 2021.  An outpatient CTA neck as well as CT head was ordered.  On the visible upper portion of the lung, apices and upper portion of bilateral upper lobes reveals scattered cystic findings within the walls.  With ongoing recent workup for shortness of breath by his allergist, a CT chest was ordered which shows scattered thin-walled cystic lesions throughout both in the periphery and central areas otherwise clear lungs on my review interpretation.  It appears he has a immunodeficiency diagnosed by his allergist.  The feeling is that may be the reason why he has so many recurrent sinus infections etc.  He does endorse history of pneumonia as a child.  Several when he was a child.  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  He describes the  symptoms in detail as well as improvement with pregabalin .  Seems worse throughout the day better in the mornings.  He wears CPAP for OSA and he thinks with his mouth being close while he sleeps the sensation of burning is improved but as he talks and agrees to his mouth during the day the burning worsens.  He denies history of cigarette smoking.  Smokes cigars 1 or so a day for a couple years from time of 26 early 30s.  Has quit and no smoking since then.  PFTs in 2022 fully interpreted below but demonstrate normal versus mild reduction in TLC and DLCO.  He had serial  spirometry in the interim which shows decreased FEV1 and FVC.  He had TTE 05/2021 that demonstrates normal valves as well as normal right-sided Cavities and normal estimated PASP.  He had a left heart cath 05/2021 that showed 20% RCA lesion, otherwise no significant lesions, nonobstructive CAD, LVEDP reported at 10.  Questionaires / Pulmonary Flowsheets:   ACT:  Asthma Control Test ACT Total Score  12/27/2022 11:00 AM 13    MMRC:     No data to display          Epworth:     11/07/2017    1:00 PM  Results of the Epworth flowsheet  Sitting and reading 3  Watching TV 3  Sitting, inactive in a public place (e.g. a theatre or a meeting) 3  As a passenger in a car for an hour without a break 0  Lying down to rest in the afternoon when circumstances permit 3  Sitting and talking to someone 0  Sitting quietly after a lunch without alcohol 0  In a car, while stopped for a few minutes in traffic 0  Total score 12    Tests:   FENO:  No results found for: "NITRICOXIDE"  PFT:    Latest Ref Rng & Units 02/07/2018    9:59 AM  PFT Results  FVC-Pre L 3.53   FVC-Predicted Pre % 79   FVC-Post L 3.49   FVC-Predicted Post % 78   Pre FEV1/FVC % % 83   Post FEV1/FCV % % 83   FEV1-Pre L 2.92   FEV1-Predicted Pre % 87   FEV1-Post L 2.90   DLCO uncorrected ml/min/mmHg 23.00   DLCO [facility]% % 77   DLVA Predicted % 100   TLC L 5.09   TLC % Predicted % 77   RV % Predicted % 75   Prior PFT 2018 personally reviewed and interpreted as spirometry just of moderate striction versus air trapping, no bronchodilator response, lung volumes not reported low low normal but TLC is 77% of predicted, mildly reduced to right lower limit of normal likely, DLCO similarly 77% predicted, suspect normal versus mildly reduced.  Given mild reduction in TLC and DLCO, this likely reflects underlying mild interstitial lung disease.  WALK:      No data to display          Imaging: No results found.  Lab  Results:  CBC    Component Value Date/Time   WBC 6.6 02/15/2024 1002   WBC 7.0 01/27/2024 1047   RBC 5.52 02/15/2024 1002   HGB 16.9 02/15/2024 1002   HCT 50.0 02/15/2024 1002   PLT 259 02/15/2024 1002   MCV 90.6 02/15/2024 1002   MCH 30.6 02/15/2024 1002   MCHC 33.8 02/15/2024 1002   RDW 14.0 02/15/2024 1002   LYMPHSABS 1.8 02/15/2024 1002   MONOABS 0.6 02/15/2024 1002  EOSABS 0.3 02/15/2024 1002   BASOSABS 0.1 02/15/2024 1002    BMET    Component Value Date/Time   NA 139 02/15/2024 1002   K 4.1 02/15/2024 1002   CL 106 02/15/2024 1002   CO2 28 02/15/2024 1002   GLUCOSE 93 02/15/2024 1002   BUN 13 02/15/2024 1002   CREATININE 0.98 02/15/2024 1002   CREATININE 0.97 10/14/2020 1141   CALCIUM 9.2 02/15/2024 1002   GFRNONAA >60 02/15/2024 1002   GFRNONAA 83 10/14/2020 1141   GFRAA 96 10/14/2020 1141    BNP No results found for: "BNP"  ProBNP No results found for: "PROBNP"  Specialty Problems       Pulmonary Problems   Dyspnea on exertion   PFT 02/07/18- Mild restriction, mild Diffusion reduction, FVC 3.49/78%, FEV1 2.90/86%, ratio 0.83, FEF 25-75% 3.01/108%, No R to BD, TLC 77%, DLCO 77%      Obstructive sleep apnea   HST 12/14/17-AHI 37.9/hour, desaturation to 68%, body weight 225 pounds      Seasonal and perennial allergic rhinitis   Remote allergy  skin testing positive for dust mite and other common environmental allergens.      ILD (interstitial lung disease) (HCC)   LIP (lymphoid interstitial pneumonitis) (HCC)    Allergies  Allergen Reactions   Trazodone  And Nefazodone Other (See Comments)    Feels hung over the next day     Immunization History  Administered Date(s) Administered   Influenza,inj,Quad PF,6+ Mos 08/24/2017, 08/31/2018   Influenza-Unspecified 10/07/2020   PFIZER(Purple Top)SARS-COV-2 Vaccination 02/05/2020, 03/11/2020, 10/23/2020    Past Medical History:  Diagnosis Date   Anxiety    situational anxiety   Arthritis     LEFT knee   Asthma    Cancer (HCC), Melanoma    skin x 3-4 times   GERD (gastroesophageal reflux disease)    on meds   Hiatal hernia    Kidney stones    Multiple gastric ulcers 2015   seen on endoscopy per Dr. Venice Gillis in [place]    Seasonal allergies    Sleep apnea    sees Dr. Dianne Fortune, uses CPAP     Tobacco History: Social History   Tobacco Use  Smoking Status Former   Current packs/day: 0.00   Types: Cigarettes   Start date: 06/10/1976   Quit date: 06/11/1979   Years since quitting: 44.8  Smokeless Tobacco Never  Tobacco Comments   pt reports smoker 0.5 pack/month as a teenager 06/11/2019   Counseling given: Not Answered Tobacco comments: pt reports smoker 0.5 pack/month as a teenager 06/11/2019   Continue to not smoke  Outpatient Encounter Medications as of 03/21/2024  Medication Sig   albuterol (VENTOLIN HFA) 108 (90 Base) MCG/ACT inhaler Inhale into the lungs every 6 (six) hours as needed for wheezing or shortness of breath.   Azelastine -Fluticasone  (DYMISTA ) 137-50 MCG/ACT SUSP Place into the nose.   omeprazole  (PRILOSEC) 40 MG capsule TAKE 1 CAPSULE (40 MG TOTAL) BY MOUTH DAILY.   pregabalin  (LYRICA ) 300 MG capsule Take 1 capsule (300 mg total) by mouth daily.   zolpidem  (AMBIEN ) 10 MG tablet 1 at bedtime for sleep if needed   loperamide  (IMODIUM ) 2 MG capsule Take 1 capsule (2 mg total) by mouth 4 (four) times daily as needed for diarrhea or loose stools. (Patient not taking: Reported on 03/21/2024)   meclizine  (ANTIVERT ) 25 MG tablet Take 1 tablet (25 mg total) by mouth every 4 (four) hours as needed for dizziness. (Patient not taking: Reported on 03/21/2024)  MECLIZINE  HCL PO Take by mouth. (Patient not taking: Reported on 03/21/2024)   methylPREDNISolone  (MEDROL  DOSEPAK) 4 MG TBPK tablet As directed (Patient not taking: Reported on 03/21/2024)   TESSALON  PERLES 100 MG capsule Take 1 capsule (100 mg total) by mouth 3 (three) times daily as needed for cough.  (Patient not taking: Reported on 03/21/2024)   No facility-administered encounter medications on file as of 03/21/2024.     Review of Systems  Review of Systems  N/a Physical Exam  BP 124/80   Pulse (!) 59   Ht 5\' 7"  (1.702 m)   Wt 215 lb 3.2 oz (97.6 kg)   SpO2 98%   BMI 33.71 kg/m   Wt Readings from Last 5 Encounters:  03/21/24 215 lb 3.2 oz (97.6 kg)  02/15/24 219 lb 1.6 oz (99.4 kg)  01/27/24 215 lb (97.5 kg)  12/21/23 210 lb (95.3 kg)  12/19/23 220 lb (99.8 kg)    BMI Readings from Last 5 Encounters:  03/21/24 33.71 kg/m  02/15/24 34.32 kg/m  01/27/24 33.67 kg/m  12/21/23 33.89 kg/m  12/19/23 35.51 kg/m     Physical Exam General: Sitting in chair, no acute distress Eyes: EOMI, no icterus Neck: Supple, no JVP Pulmonary: Clear, normal work of breathing Cardiovascular: Warm, no edema, regular rhythm Abdomen: Nondistended, bowel sounds present MSK: No synovitis , no joint effusion Neuro: Normal gait, no weakness Psych: Normal mood, full affect   Assessment & Plan:   Chronic cough: Present for years, suspect decade or more.  Will be hard to control.  Likely combination of upper airway cough syndrome given his description of nasal congestion as well as hiatal hernia and history of reflux.  Possible contribution of asthma given shortness of breath and productive cough.  Worse with Arnuity, DPI in the past.  He did not complain of cough today.  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  Given his history of pneumonias and recurrent infections pneumatoceles are considered.  There are thin-walled cyst.  He has burning mouth.  Do wonder if this is a manifestation of underlying Sjogren's although review of literature indicates not a clear connection.  With a thin-walled cysts, burning mouth, as well as report of her diagnosis of immunodeficiency per immunologist of specific antibody deficiency with normal  immunoglobulin concentration and normal number of B cells, suspicion for LIP relatively high even though epidemiologically it is rare.  Other considerations include Langerhans but given lack of smoking history this felt less likely.  Cheryle Corral Dubay considered but given other history felt less likely, no characteristic skin manifestations as well.  Seen by [facility] in the interim in 2024, PFTs normal, imaging stable, ultimately agree with clinical diagnosis of LIP. Given stability disease, no further recommendations made.  Will obtain yearly PFT, CT scan, ordered today.  If stable plan for repeat in 1 year with follow-up thereafter.  Return in about 1 year (around 03/21/2025) for f/u Dr. Marygrace Snellen.   Guerry Leek, MD 03/21/2024

## 2024-03-21 NOTE — Patient Instructions (Signed)
 Nice to see you again  We will repeat a CT scan and pulmonary function test in the coming days to weeks.  If everything is stable we can continue yearly follow-up with scans and pulmonary function test just to keep an eye on the lung function etc.  If there is any abnormality me to change things I will let you know.  Return to clinic in 1 year or sooner as needed with Dr. Marygrace Snellen

## 2024-03-27 ENCOUNTER — Ambulatory Visit (INDEPENDENT_AMBULATORY_CARE_PROVIDER_SITE_OTHER): Admitting: Internal Medicine

## 2024-03-27 ENCOUNTER — Encounter: Payer: Self-pay | Admitting: Internal Medicine

## 2024-03-27 VITALS — BP 128/80 | HR 71 | Temp 97.9°F | Resp 18 | Wt 217.1 lb

## 2024-03-27 DIAGNOSIS — J842 Lymphoid interstitial pneumonia: Secondary | ICD-10-CM

## 2024-03-27 DIAGNOSIS — K145 Plicated tongue: Secondary | ICD-10-CM | POA: Diagnosis not present

## 2024-03-27 DIAGNOSIS — Z85820 Personal history of malignant melanoma of skin: Secondary | ICD-10-CM | POA: Diagnosis not present

## 2024-03-27 DIAGNOSIS — G510 Bell's palsy: Secondary | ICD-10-CM | POA: Diagnosis not present

## 2024-03-27 DIAGNOSIS — E538 Deficiency of other specified B group vitamins: Secondary | ICD-10-CM

## 2024-03-27 DIAGNOSIS — J3089 Other allergic rhinitis: Secondary | ICD-10-CM

## 2024-03-27 DIAGNOSIS — D806 Antibody deficiency with near-normal immunoglobulins or with hyperimmunoglobulinemia: Secondary | ICD-10-CM | POA: Diagnosis not present

## 2024-03-27 DIAGNOSIS — K146 Glossodynia: Secondary | ICD-10-CM

## 2024-03-27 MED ORDER — CLOBETASOL PROPIONATE 0.05 % EX OINT: TOPICAL_OINTMENT | CUTANEOUS | 1 refills | Status: DC

## 2024-03-27 NOTE — Progress Notes (Unsigned)
 Follow Up Note  RE: Michael Bernard MRN: [id number] DOB: 27-Apr-1956 Date of Office Visit: 03/27/2024  Referring provider: Donley Furth, MD Primary care provider: Donley Furth, MD  Chief Complaint: No chief complaint on file.  History of Present Illness: I had the pleasure of seeing Michael Bernard for a follow up visit at the [facility] on 03/29/2024. He is a 68 y.o. male, who is being followed for SAD, persistent asthma, recurrent sinusitis, allergic rhinitis . His previous allergy  office visit was on 05/31/23 with Dr. Jolayne Natter. Today is a regular follow up visit.  History obtained from patient, chart review.   Since last visit:  Pulm/30/25: Chronic cough attributed to upper airway cough syndrome and L RPD.  Controlled at baseline.  L IP stable.  Recommend monitoring oxygen, yearly PFT, CT scan.  Derm 08/16/23: seen for burning tongue: Diagnosed with fissured tongue recommend continue pregabalin , deferred progress swish and spit, brush with H2O2 and rinse out with probiotic yogurt  ENT 08/01/2023: Initial treatment 7 days of Diflucan with no improvement, started on vitamin B supplement and 10-day course of dexamethasone elixir attempted with no response.  Reported improvement with pregabalin , diet modification, SL as free toothpaste, oral probiotics.  Referred to dermatology for fissured tongue   Today he reports  Discussed the use of AI scribe software for clinical note transcription with the patient, who gave verbal consent to proceed.  History of Present Illness Michael Bernard is a 68 year old male with recurrent viral infections who presents with ongoing symptoms and concerns about his immune system.  He had a ER visit in January for severe nausea and vomiting.  Thought to be norovirus.  He was prescribed Lomotil , which he found effective. He is concerned about his immune system's ability to handle infections, given his history of specific antibody  deficiency and recurrent infections.  Denies any sinopulmonary infections or other antibiotics since last visit.  We attempted to get him on scheduling given his known LIP diagnosis as well, however treatment was cost prohibitive for him  He has a history of burning mouth syndrome, which has been ongoing but is less severe than in 2023. Dietary changes, such as eating yogurt and avoiding soy sauce and peppers, have helped manage symptoms. He continues to take pregabalin  for this condition.  In March, he developed a burning sensation in his hand, resembling a deep burn without external signs. He was treated with Valcyclovir for suspected shingles, but the pain persists. He uses a shingles ointment and regular hand lotion to manage symptoms. No itching is associated with the burning sensation, only deep burning pain. A few days after this he developed Bells palsy on the right side of his face.    Residual symptoms include facial muscle fatigue and pain behind the ear. He also had Bell's palsy on the left side 15 years ago. He also has a fissured tongue   He has a diagnosis of lymphocytic interstitial pneumonia and is under pulmonary care. A CT scan is scheduled to monitor his lung condition, and he uses a pulse oximeter to track his oxygen levels during the day.  He has a history of melanoma and is concerned about his skin condition. He has not had the shingles vaccine and is considering further vaccinations.   Assessment and Plan: Michael Bernard is a 68 y.o. 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) - Plan: C-reactive protein, Immunoglobulins,  QN, A/E/G/M, Sed Rate (ESR)  LIP (lymphoid interstitial pneumonitis) (HCC)  Burning mouth syndrome  Other allergic rhinitis  Bell's palsy - Plan: Ambulatory referral to Neurology  B12 deficiency  Hx of malignant melanoma of skin  Tongue, fissured   Plan:   Specific Antibody Deficiency Start  azithromycn 250mg  M/W/F  We will resubmit for SCIG  Will get updated immune labs   Recurrent bells palsy, shingles, hand dermatitis, fissured tongue   - trial of clobetasol twice daily for for hand dermatitis, stop if worsens  - Will refer to neurology first,   Dyspnea:  LIP Follow up with Pulmonary, defer further work up to pulmonary Will continue albuterol as needed  Mixed Allergic and nonallergic Rhinitis:   - Testing 08/25/22: postive to  [place]  blue grass pollen and cockle[place] weed pollen, intradermals were negative  -Continue avoidance measures - Continue taking:  Zyrtec 10mg  daily as needed, Flonase  1 spray per nostril as needed for stuffy nose  and Singulair (montelukast) 10mg  daily - You can use an extra dose of the antihistamine, if needed, for breakthrough symptoms.  - Consider nasal saline rinses 1-2 times daily to remove allergens from the nasal cavities as well as help with mucous clearance (this is especially helpful to do before the nasal sprays are given)   Burning Tongue Syndrome  Continue pregabalin  negative food testing,  - Continue recommendations from ENT and dermatology   Follow up: If neurology referral is unhelpful we will refer you to rheumatology, call to let us  know.  Follow-up in clinic in 6 months  Thank you so much for letting me partake in your care today.  Don't hesitate to reach out if you have any additional concerns!  Orelia Binet, MD  [facility] [place] [place]   Meds ordered this encounter  Medications   clobetasol ointment (TEMOVATE) 0.05 %    Sig: Apply topically twice daily to BODY as needed for SEVERE red, sandpaper and thickened like rash.  Do not use on face, groin or armpits.  Use for up to two weeks at a time.    Dispense:  60 g    Refill:  1   azithromycin (ZITHROMAX) 250 MG tablet    Sig: Take 250 mg every Monday Wednesday Friday for infection prevention for immunodeficiency    Dispense:  12 each    Refill:   6    Lab Orders         C-reactive protein         Immunoglobulins, QN, A/E/G/M         Sed Rate (ESR)     Diagnostics: None done    Medication List:  Current Outpatient Medications  Medication Sig Dispense Refill   azithromycin (ZITHROMAX) 250 MG tablet Take 250 mg every Monday Wednesday Friday for infection prevention for immunodeficiency 12 each 6   clobetasol ointment (TEMOVATE) 0.05 % Apply topically twice daily to BODY as needed for SEVERE red, sandpaper and thickened like rash.  Do not use on face, groin or armpits.  Use for up to two weeks at a time. 60 g 1   albuterol (VENTOLIN HFA) 108 (90 Base) MCG/ACT inhaler Inhale into the lungs every 6 (six) hours as needed for wheezing or shortness of breath.     Azelastine -Fluticasone  (DYMISTA ) 137-50 MCG/ACT SUSP Place into the nose.     loperamide  (IMODIUM ) 2 MG capsule Take 1 capsule (2 mg total) by mouth 4 (four) times daily as needed for diarrhea or loose  stools. (Patient not taking: Reported on 03/21/2024) 12 capsule 0   meclizine  (ANTIVERT ) 25 MG tablet Take 1 tablet (25 mg total) by mouth every 4 (four) hours as needed for dizziness. (Patient not taking: Reported on 03/21/2024) 60 tablet 3   MECLIZINE  HCL PO Take by mouth. (Patient not taking: Reported on 03/21/2024)     methylPREDNISolone  (MEDROL  DOSEPAK) 4 MG TBPK tablet As directed (Patient not taking: Reported on 03/21/2024) 21 tablet 0   omeprazole  (PRILOSEC) 40 MG capsule TAKE 1 CAPSULE (40 MG TOTAL) BY MOUTH DAILY. 90 capsule 3   pregabalin  (LYRICA ) 300 MG capsule Take 1 capsule (300 mg total) by mouth daily. 90 capsule 1   TESSALON  PERLES 100 MG capsule Take 1 capsule (100 mg total) by mouth 3 (three) times daily as needed for cough. (Patient not taking: Reported on 03/21/2024) 20 capsule 0   zolpidem  (AMBIEN ) 10 MG tablet 1 at bedtime for sleep if needed 90 tablet 1   No current facility-administered medications for this visit.   Allergies: Allergies  Allergen Reactions    Trazodone  And Nefazodone Other (See Comments)    Feels hung over the next day    I reviewed his past medical history, social history, family history, and environmental history and no significant changes have been reported from his previous visit.  ROS: All others negative except as noted per HPI.   Objective: BP 128/80   Pulse 71   Temp 97.9 F (36.6 C) (Temporal)   Resp 18   Wt 217 lb 1.6 oz (98.5 kg)   BMI 34.00 kg/m  Body mass index is 34 kg/m. General Appearance:  Tired appearing, cooperative, no distress, appears stated age  Head:  Normocephalic, without obvious abnormality, atraumatic  Eyes:  Conjunctiva clear, EOM's intact  Nose: Nares normal, normal mucosa, no visible anterior polyps, and septum midline  Throat: Lips, fissured tongue; teeth and gums normal, + cobblestoning  Neck: Supple, symmetrical  Lungs:   clear to auscultation bilaterally, Respirations unlabored, no coughing  Heart:  regular rate and rhythm and no murmur, Appears well perfused  Extremities: No edema  Skin: Skin color, texture, turgor normal, no rashes or lesions on visualized portions of skin  Neurologic: No gross deficits   Previous notes and tests were reviewed. The plan was reviewed with the patient/family, and all questions/concerned were addressed.  It was my pleasure to see Michael Bernard today and participate in his care. Please feel free to contact me with any questions or concerns.  Sincerely,  Orelia Binet, MD  Allergy  & Immunology  [facility]  [place] Office: [phone]

## 2024-03-27 NOTE — Patient Instructions (Addendum)
 Specific Antibody Deficiency with acute sinus infection  Start azithromycn 250mg  M/W/F  We will resubmit for SCIG  Will get updated immune labs   Recurrent bells palsy, shingles, hand dermatitis  - trial of clobetasol twice daily for for hand dermatitis, stop if worsens  - Will refer to neurology first,   Dyspnea:  LIP Follow up with Pulmonary, defer further work up to pulmonary Will continue albuterol as needed Singulair continued for allergic rhinitis    Mixed Allergic and nonallergic Rhinitis:   - Testing 08/25/22: postive to  [place]  blue grass pollen and cockle[place] weed pollen, intradermals were negative  -Continue avoidance measures - Continue taking:  Zyrtec 10mg  daily as needed, Flonase  1 spray per nostril as needed for stuffy nose  and Singulair (montelukast) 10mg  daily - You can use an extra dose of the antihistamine, if needed, for breakthrough symptoms.  - Consider nasal saline rinses 1-2 times daily to remove allergens from the nasal cavities as well as help with mucous clearance (this is especially helpful to do before the nasal sprays are given)   Burning Tongue Syndrome  Continue pregabalin  negative food testing,  - Continue recommendations from ENT and dermatology   Follow up: If neurology referral is unhelpful we will refer you to rheumatology, call to let us  know.  Follow-up in clinic in 6 months  Thank you so much for letting me partake in your care today.  Don't hesitate to reach out if you have any additional concerns!  Orelia Binet, MD  [facility] [place] [place]

## 2024-03-29 MED ORDER — AZITHROMYCIN 250 MG PO TABS: ORAL_TABLET | ORAL | 6 refills | Status: DC

## 2024-03-30 LAB — SEDIMENTATION RATE: Sed Rate: 18 mm/h (ref 0–30)

## 2024-03-30 LAB — IMMUNOGLOBULINS A/E/G/M, SERUM
IgA/Immunoglobulin A, Serum: 383 mg/dL (ref 61–437)
IgE (Immunoglobulin E), Serum: 13 [IU]/mL (ref 6–495)
IgG (Immunoglobin G), Serum: 1701 mg/dL — ABNORMAL HIGH (ref 603–1613)
IgM (Immunoglobulin M), Srm: 132 mg/dL (ref 20–172)

## 2024-03-30 LAB — C-REACTIVE PROTEIN: CRP: 2 mg/L (ref 0–10)

## 2024-04-02 ENCOUNTER — Ambulatory Visit: Admitting: Internal Medicine

## 2024-04-03 ENCOUNTER — Ambulatory Visit: Payer: Self-pay | Admitting: Internal Medicine

## 2024-04-03 NOTE — Progress Notes (Signed)
 Immune labs and inflammatory markers all returned normal or negative. We will continue current plan with referral to neurology and submit for SCIG (immunoglobulin replacement).

## 2024-04-04 ENCOUNTER — Ambulatory Visit
Admission: RE | Admit: 2024-04-04 | Discharge: 2024-04-04 | Disposition: A | Discharge status: Home / Self Care | Source: Ambulatory Visit | Attending: Pulmonary Disease | Admitting: Pulmonary Disease

## 2024-04-04 DIAGNOSIS — J842 Lymphoid interstitial pneumonia: Secondary | ICD-10-CM

## 2024-04-05 ENCOUNTER — Telehealth: Payer: Self-pay | Admitting: *Deleted

## 2024-04-05 NOTE — Telephone Encounter (Signed)
-----   Message from Michael Bernard sent at 03/29/2024  5:03 PM EDT ----- Can we resubmit this patient for subcutaneous immunoglobulin for specific antibody deficiency.  He has 3 infections this past year.  Thank you!

## 2024-04-05 NOTE — Telephone Encounter (Signed)
 L/M of patient to contact me to discuss start SCIG

## 2024-04-05 NOTE — Telephone Encounter (Signed)
 Spoke to patient and will submit him to [facility] pharmacy to get him started on therapy

## 2024-04-06 ENCOUNTER — Telehealth: Payer: Self-pay | Admitting: Internal Medicine

## 2024-04-06 ENCOUNTER — Inpatient Hospital Stay: Attending: Oncology

## 2024-04-06 DIAGNOSIS — D751 Secondary polycythemia: Secondary | ICD-10-CM | POA: Diagnosis not present

## 2024-04-06 LAB — HEMOGLOBIN AND HEMATOCRIT (CANCER CENTER ONLY)
HCT: 46.3 % (ref 39.0–52.0)
Hemoglobin: 16 g/dL (ref 13.0–17.0)

## 2024-04-06 NOTE — Telephone Encounter (Signed)
 Hillis has been internally referred to [facility]rology.  Levis has been scheduled for 06/01/24 at 8:30 am with Dr. Salli Crawley.

## 2024-04-09 ENCOUNTER — Encounter: Payer: Self-pay | Admitting: Oncology

## 2024-04-09 ENCOUNTER — Encounter: Payer: Self-pay | Admitting: Pulmonary Disease

## 2024-04-09 DIAGNOSIS — L570 Actinic keratosis: Secondary | ICD-10-CM | POA: Diagnosis not present

## 2024-04-09 DIAGNOSIS — L821 Other seborrheic keratosis: Secondary | ICD-10-CM | POA: Diagnosis not present

## 2024-04-09 DIAGNOSIS — D2239 Melanocytic nevi of other parts of face: Secondary | ICD-10-CM | POA: Diagnosis not present

## 2024-04-09 DIAGNOSIS — D485 Neoplasm of uncertain behavior of skin: Secondary | ICD-10-CM | POA: Diagnosis not present

## 2024-04-09 DIAGNOSIS — Z85820 Personal history of malignant melanoma of skin: Secondary | ICD-10-CM | POA: Diagnosis not present

## 2024-04-09 DIAGNOSIS — D225 Melanocytic nevi of trunk: Secondary | ICD-10-CM | POA: Diagnosis not present

## 2024-04-09 NOTE — Telephone Encounter (Signed)
 Thanks

## 2024-04-09 NOTE — Telephone Encounter (Signed)
 Advise patient mychart msg

## 2024-04-11 ENCOUNTER — Ambulatory Visit: Payer: Self-pay | Admitting: Pulmonary Disease

## 2024-04-11 DIAGNOSIS — I517 Cardiomegaly: Secondary | ICD-10-CM

## 2024-04-11 NOTE — Telephone Encounter (Signed)
**Note De-identified  [place] Obfuscation** Please advise 

## 2024-04-11 NOTE — Telephone Encounter (Signed)
 Please see if can schedule return OV with me sooner than scheduled, next available. Please send referral to Cardiology, diagnosis enlarged heart. Thanks!

## 2024-04-12 NOTE — Telephone Encounter (Signed)
 Cardiology referral has been placed.   Dr. Marygrace Snellen, please advise if you would like to double book. You do not have any availability

## 2024-04-13 ENCOUNTER — Encounter: Payer: Self-pay | Admitting: Internal Medicine

## 2024-04-19 NOTE — Telephone Encounter (Signed)
 Front, please add patient to cancellation list. Thanks

## 2024-04-19 NOTE — Telephone Encounter (Signed)
 Can add to cancellation list or use acute visit if able

## 2024-04-26 ENCOUNTER — Other Ambulatory Visit: Payer: Self-pay | Admitting: Internal Medicine

## 2024-04-26 ENCOUNTER — Other Ambulatory Visit: Payer: Self-pay | Admitting: Family Medicine

## 2024-04-26 NOTE — Telephone Encounter (Signed)
 Last OV 03/21/2024 Dr Marygrace Snellen - dx LIP Last OV 10/03/2023 Dr. Linder Revere - dx OSA  Patient has f/u OV on 10/02/2024 with Dr. Linder Revere  Allergies  Allergen Reactions   Trazodone  And Nefazodone Other (See Comments)    Feels hung over the next day      Dr. Linder Revere, please advise on refill for zolpidem .  Thank you.

## 2024-04-26 NOTE — Telephone Encounter (Signed)
 Refill sent.

## 2024-05-16 ENCOUNTER — Other Ambulatory Visit: Payer: Self-pay | Admitting: Oncology

## 2024-05-16 DIAGNOSIS — D751 Secondary polycythemia: Secondary | ICD-10-CM

## 2024-05-17 ENCOUNTER — Inpatient Hospital Stay: Admitting: Oncology

## 2024-05-17 ENCOUNTER — Inpatient Hospital Stay: Attending: Oncology

## 2024-05-17 ENCOUNTER — Encounter: Payer: Self-pay | Admitting: Oncology

## 2024-05-17 VITALS — BP 126/74 | HR 64 | Temp 97.9°F | Resp 16 | Wt 215.3 lb

## 2024-05-17 DIAGNOSIS — Z79899 Other long term (current) drug therapy: Secondary | ICD-10-CM | POA: Insufficient documentation

## 2024-05-17 DIAGNOSIS — J849 Interstitial pulmonary disease, unspecified: Secondary | ICD-10-CM | POA: Insufficient documentation

## 2024-05-17 DIAGNOSIS — D751 Secondary polycythemia: Secondary | ICD-10-CM

## 2024-05-17 DIAGNOSIS — Z85820 Personal history of malignant melanoma of skin: Secondary | ICD-10-CM | POA: Insufficient documentation

## 2024-05-17 DIAGNOSIS — K146 Glossodynia: Secondary | ICD-10-CM | POA: Diagnosis not present

## 2024-05-17 DIAGNOSIS — G4730 Sleep apnea, unspecified: Secondary | ICD-10-CM | POA: Diagnosis not present

## 2024-05-17 LAB — CBC WITH DIFFERENTIAL (CANCER CENTER ONLY)
Abs Immature Granulocytes: 0.01 10*3/uL (ref 0.00–0.07)
Basophils Absolute: 0.1 10*3/uL (ref 0.0–0.1)
Basophils Relative: 1 %
Eosinophils Absolute: 0.2 10*3/uL (ref 0.0–0.5)
Eosinophils Relative: 4 %
HCT: 49 % (ref 39.0–52.0)
Hemoglobin: 17.1 g/dL — ABNORMAL HIGH (ref 13.0–17.0)
Immature Granulocytes: 0 %
Lymphocytes Relative: 28 %
Lymphs Abs: 1.7 10*3/uL (ref 0.7–4.0)
MCH: 31.1 pg (ref 26.0–34.0)
MCHC: 34.9 g/dL (ref 30.0–36.0)
MCV: 89.1 fL (ref 80.0–100.0)
Monocytes Absolute: 0.6 10*3/uL (ref 0.1–1.0)
Monocytes Relative: 11 %
Neutro Abs: 3.4 10*3/uL (ref 1.7–7.7)
Neutrophils Relative %: 56 %
Platelet Count: 288 10*3/uL (ref 150–400)
RBC: 5.5 MIL/uL (ref 4.22–5.81)
RDW: 12.9 % (ref 11.5–15.5)
WBC Count: 6 10*3/uL (ref 4.0–10.5)
nRBC: 0 % (ref 0.0–0.2)

## 2024-05-17 NOTE — Progress Notes (Signed)
 [facility]  HEMATOLOGY CLINIC PROGRESS NOTE  PATIENT NAME: Michael Bernard   MR#: [id number] DOB: 05-Oct-1956  Patient Care Team: Michael Garnette LABOR, MD as PCP - General (Family Medicine) Michael Norris, MD as Consulting Physician (Allergy ) Hunsucker, Donnice SAUNDERS, MD as Consulting Physician (Pulmonary Disease) Michael Reggy BIRCH, MD as Consulting Physician (Pulmonary Disease)  Date of visit: 05/17/2024   ASSESSMENT & PLAN:   Michael Bernard is a 68 y.o. gentleman with past medical history of Burning mouth syndrome, ILD/ fibrocystic lung disease/ Lymphocytic Interstitial Pneumonia, melanoma of skin, hiatal hernia, multiple gastric ulcers, obstructive sleep apnea on CPAP, was referred to our clinic in March 2025 for evaluation of polycythemia.  Workup consistent with secondary polycythemia, likely from his interstitial lung disease and obstructive sleep apnea.  Secondary polycythemia Elevated hemoglobin levels with recent values of 18.5 and 18.3 in January and March 2025.  On his consultation with us  on 02/15/2024, labs actually showed improved hemoglobin of 16.9, hematocrit 50.  White count and platelet count were within normal limits.  CMP, iron studies, LDH, erythropoietin  levels were all unremarkable. JAK2 mutation with reflex testing for CALR, MPL, exon 12-15 mutations was also negative.  His ILD and sleep apnea may contribute to his secondary polycythemia.  Discussed risks of thrombosis, stroke, and cardiovascular complications due to increased blood viscosity.  Consider phlebotomy if hematocrit remains above 55 to minimize complications.  Since he started seeing us  in clinic, he has not needed a therapeutic phlebotomy.  Labs today showed hematocrit of 49, hemoglobin 17.1.  No indication for therapeutic phlebotomy currently.   We will start to space out his clinic intervals.  RTC in 3 months for labs and possible phlebotomy.  RTC in 6 months for labs, office  visit and possible phlebotomy.  ILD (interstitial lung disease) (HCC) ILD may contribute to secondary polycythemia due to impaired oxygen exchange. Evaluated by a lung specialist. Sleep apnea managed with CPAP may also contribute to elevated hemoglobin levels.  Hx of malignant melanoma of skin Multiple melanomas on the shoulder and face, previously excised. No current active lesions reported.  He has close follow-up with dermatologist.  Burning mouth syndrome Chronic burning mouth syndrome persisting for over two years. Symptoms include burning sensation on the right side, possibly related to previous Bell's palsy. Neurological evaluation pending to explore potential connections. - Continue current management with pregabalin . - Follow up with neurologist for further evaluation.   I spent a total of 30 minutes during this encounter with the patient including review of chart and various tests results, discussions about plan of care and coordination of care plan.  I reviewed lab results and outside records for this visit and discussed relevant results with the patient. Diagnosis, plan of care and treatment options were also discussed in detail with the patient. Opportunity provided to ask questions and answers provided to his apparent satisfaction. Provided instructions to call our clinic with any problems, questions or concerns prior to return visit. I recommended to continue follow-up with PCP and sub-specialists. He verbalized understanding and agreed with the plan. No barriers to learning was detected.  Michael Patten, MD  05/17/2024 10:44 AM  [facility] - A DEPT OF [facility] [address] [place] [address] Dept: [phone] Dept Fax: [phone]   CHIEF COMPLAINT/ REASON FOR VISIT:  Follow-up for secondary polycythemia, likely contributed by his interstitial lung disease and obstructive sleep apnea.  INTERVAL  HISTORY:  Discussed the use of AI scribe software for clinical note transcription with the patient, who gave verbal consent to proceed.  History of Present Illness Michael Bernard is a 68 year old male with a history of elevated hemoglobin and hematocrit who presents for follow-up.  His hemoglobin and hematocrit levels have been stable, with a significant increase noted in January when he was very ill, reaching hemoglobin 18.5 and hematocrit 55.8. By March, these levels decreased to 16.9 and 50, respectively. Recent lab results show a hematocrit of 49, down from 46 six weeks ago. He has not required phlebotomies recently.  He experiences chest pain and shortness of breath, particularly with exertion. He cannot walk far or uphill without becoming breathless. His oxygen levels are around 96-97%. He has a history of chest pain evaluated two years ago with no significant findings. He uses a handicap placard to avoid long walks.  He has a history of skin issues, including a burning sensation in his hands and a rash treated as shingles. He uses Eucerin cream for relief. He has been referred to a neurologist for further evaluation of symptoms on his right side, including a burning mouth and a history of Bell's palsy on the left side in 2009.  He has been diagnosed with burning mouth syndrome, persisting since May 2020, and is taking pregabalin  for this condition.   SUMMARY OF HEMATOLOGIC HISTORY:  On 01/27/2024, routine labs at his PCPs office showed elevated hemoglobin of 18.3, hematocrit of 54.6.  White count and platelet count were within normal limits.  Previously on 12/18/2018, his hemoglobin was 18.5, hematocrit 55.8.  Given persistent polycythemia, referral was sent to us  for further evaluation.   The patient reports that this is a new finding and has never had high hemoglobin count in the past. The patient also mentions a significant weight loss of eight pounds in five days due to a suspected  norovirus infection earlier in the year.   The patient's burning mouth syndrome has been persistent and has led to dietary changes, including avoiding spicy foods, soy, soft drinks, sugar, and bread. Despite these changes, the patient reports that the burning sensation is still present and affects his daily life.   The patient's interstitial lung disease and sleep apnea are reportedly under control with the use of a CPAP machine. However, the patient reports struggling with insomnia and only getting four to six hours of sleep per night.   The patient also mentions a history of multiple melanomas that have been surgically removed. The patient is also scheduled for a hernia repair, which has been postponed due to the current investigation of his high hemoglobin count.   Family history of leukemia noted.    On his consultation with us  on 02/15/2024, labs actually showed improved hemoglobin of 16.9, hematocrit 50.  White count and platelet count were within normal limits.  CMP, iron studies, LDH, erythropoietin  levels were all unremarkable. JAK2 mutation with reflex testing for CALR, MPL, exon 12-15 mutations was also negative.   His ILD and sleep apnea may contribute to his secondary polycythemia.   Discussed risks of thrombosis, stroke, and cardiovascular complications due to increased blood viscosity.  Consider phlebotomy if hematocrit remains above 55 to minimize complications.   I have reviewed the past medical history, past surgical history, social history and family history with the patient and they are unchanged from previous note.  ALLERGIES: He is allergic to trazodone  and nefazodone.  MEDICATIONS:  Current Outpatient Medications  Medication Sig Dispense Refill   albuterol (VENTOLIN HFA) 108 (90 Base) MCG/ACT inhaler Inhale into the lungs every 6 (six) hours as needed for wheezing or shortness of breath.     Azelastine -Fluticasone  (DYMISTA ) 137-50 MCG/ACT SUSP Place into the nose.      azithromycin  (ZITHROMAX ) 250 MG tablet Take 250 mg every Monday Wednesday Friday for infection prevention for immunodeficiency 12 each 6   clobetasol  ointment (TEMOVATE ) 0.05 % Apply topically twice daily to BODY as needed for SEVERE red, sandpaper and thickened like rash.  Do not use on face, groin or armpits.  Use for up to two weeks at a time. 60 g 1   loperamide  (IMODIUM ) 2 MG capsule Take 1 capsule (2 mg total) by mouth 4 (four) times daily as needed for diarrhea or loose stools. 12 capsule 0   meclizine  (ANTIVERT ) 25 MG tablet Take 1 tablet (25 mg total) by mouth every 4 (four) hours as needed for dizziness. 60 tablet 3   omeprazole  (PRILOSEC) 40 MG capsule TAKE 1 CAPSULE (40 MG TOTAL) BY MOUTH DAILY. 90 capsule 3   pregabalin  (LYRICA ) 300 MG capsule TAKE 1 CAPSULE BY MOUTH EVERY DAY 90 capsule 1   zolpidem  (AMBIEN ) 10 MG tablet TAKE 1 TABLET BY MOUTH AT BEDTIME FOR SLEEP IF NEEDED 90 tablet 1   MECLIZINE  HCL PO Take by mouth. (Patient not taking: Reported on 03/21/2024)     methylPREDNISolone  (MEDROL  DOSEPAK) 4 MG TBPK tablet As directed (Patient not taking: Reported on 03/21/2024) 21 tablet 0   TESSALON  PERLES 100 MG capsule Take 1 capsule (100 mg total) by mouth 3 (three) times daily as needed for cough. (Patient not taking: Reported on 03/21/2024) 20 capsule 0   No current facility-administered medications for this visit.     REVIEW OF SYSTEMS:    Review of Systems - Oncology  All other pertinent systems were reviewed with the patient and are negative.  PHYSICAL EXAMINATION:    Onc Performance Status - 05/17/24 0846       ECOG Perf Status   ECOG Perf Status Restricted in physically strenuous activity but ambulatory and able to carry out work of a light or sedentary nature, e.g., light house work, office work      KPS SCALE   KPS % SCORE Able to carry on normal activity, minor s/s of disease          Vitals:   05/17/24 0837  BP: 126/74  Pulse: 64  Resp: 16  Temp: 97.9  F (36.6 C)  SpO2: 97%   Filed Weights   05/17/24 0837  Weight: 215 lb 4.8 oz (97.7 kg)    Physical Exam Constitutional:      General: He is not in acute distress.    Appearance: Normal appearance.  HENT:     Head: Normocephalic and atraumatic.   Eyes:     Conjunctiva/sclera: Conjunctivae normal.    Cardiovascular:     Rate and Rhythm: Normal rate and regular rhythm.  Pulmonary:     Effort: Pulmonary effort is normal. No respiratory distress.  Abdominal:     General: There is no distension.   Neurological:     General: No focal deficit present.     Mental Status: He is alert and oriented to person, place, and time.   Psychiatric:        Mood and Affect: Mood normal.        Behavior: Behavior normal.      LABORATORY DATA:   I  have reviewed the data as listed.  Results for orders placed or performed in visit on 05/17/24  CBC with Differential (Cancer Center Only)  Result Value Ref Range   WBC Count 6.0 4.0 - 10.5 K/uL   RBC 5.50 4.22 - 5.81 MIL/uL   Hemoglobin 17.1 (H) 13.0 - 17.0 g/dL   HCT 50.9 60.9 - 47.9 %   MCV 89.1 80.0 - 100.0 fL   MCH 31.1 26.0 - 34.0 pg   MCHC 34.9 30.0 - 36.0 g/dL   RDW 87.0 88.4 - 84.4 %   Platelet Count 288 150 - 400 K/uL   nRBC 0.0 0.0 - 0.2 %   Neutrophils Relative % 56 %   Neutro Abs 3.4 1.7 - 7.7 K/uL   Lymphocytes Relative 28 %   Lymphs Abs 1.7 0.7 - 4.0 K/uL   Monocytes Relative 11 %   Monocytes Absolute 0.6 0.1 - 1.0 K/uL   Eosinophils Relative 4 %   Eosinophils Absolute 0.2 0.0 - 0.5 K/uL   Basophils Relative 1 %   Basophils Absolute 0.1 0.0 - 0.1 K/uL   Immature Granulocytes 0 %   Abs Immature Granulocytes 0.01 0.00 - 0.07 K/uL     RADIOGRAPHIC STUDIES:  No recent pertinent imaging studies available to review.  Orders Placed This Encounter  Procedures   CBC with Differential (Cancer Center Only)    Standing Status:   Standing    Number of Occurrences:   4    Expiration Date:   05/17/2025     Future  Appointments  Date Time Provider Department Center  05/24/2024 10:45 AM Hunsucker, Donnice SAUNDERS, MD [facility] None  06/01/2024  8:30 AM Penumalli, Eduard SAUNDERS, MD [facility] None  06/27/2024  9:00 AM [facility] PFT ROOM [facility] None  06/29/2024  9:40 AM Mallipeddi, Vishnu P, MD [facility]  08/16/2024  8:45 AM [facility] LAB [facility] None  08/16/2024  9:30 AM [facility] INFUSION [facility] None  10/01/2024  8:30 AM Michael Norris, MD [facility] None  10/02/2024  2:30 PM Michael Rama D, MD [facility] None  11/08/2024  8:15 AM [facility] LAB [facility] None  11/08/2024  8:45 AM Tayana Shankle, Chinita, MD [facility] None  11/08/2024  9:30 AM [facility] INFUSION [facility] None  12/05/2024  8:00 AM [facility] VISIT [facility]     This document was completed utilizing speech recognition software. Grammatical errors, random word insertions, pronoun errors, and incomplete sentences are an occasional consequence of this system due to software limitations, ambient noise, and hardware issues. Any formal questions or concerns about the content, text or information contained within the body of this dictation should be directly addressed to the provider for clarification.

## 2024-05-17 NOTE — Assessment & Plan Note (Addendum)
 Multiple melanomas on the shoulder and face, previously excised. No current active lesions reported.  He has close follow-up with dermatologist.

## 2024-05-17 NOTE — Assessment & Plan Note (Addendum)
 Elevated hemoglobin levels with recent values of 18.5 and 18.3 in January and March 2025.  On his consultation with us  on 02/15/2024, labs actually showed improved hemoglobin of 16.9, hematocrit 50.  White count and platelet count were within normal limits.  CMP, iron studies, LDH, erythropoietin  levels were all unremarkable. JAK2 mutation with reflex testing for CALR, MPL, exon 12-15 mutations was also negative.  His ILD and sleep apnea may contribute to his secondary polycythemia.  Discussed risks of thrombosis, stroke, and cardiovascular complications due to increased blood viscosity.  Consider phlebotomy if hematocrit remains above 55 to minimize complications.  Since he started seeing us  in clinic, he has not needed a therapeutic phlebotomy.  Labs today showed hematocrit of 49, hemoglobin 17.1.  No indication for therapeutic phlebotomy currently.   We will start to space out his clinic intervals.  RTC in 3 months for labs and possible phlebotomy.  RTC in 6 months for labs, office visit and possible phlebotomy.

## 2024-05-17 NOTE — Assessment & Plan Note (Signed)
 Chronic burning mouth syndrome persisting for over two years. Symptoms include burning sensation on the right side, possibly related to previous Bell's palsy. Neurological evaluation pending to explore potential connections. - Continue current management with pregabalin . - Follow up with neurologist for further evaluation.

## 2024-05-17 NOTE — Assessment & Plan Note (Signed)
 ILD may contribute to secondary polycythemia due to impaired oxygen exchange. Evaluated by a lung specialist. Sleep apnea managed with CPAP may also contribute to elevated hemoglobin levels.

## 2024-05-24 ENCOUNTER — Ambulatory Visit: Admitting: Pulmonary Disease

## 2024-05-24 ENCOUNTER — Encounter: Payer: Self-pay | Admitting: Pulmonary Disease

## 2024-05-24 VITALS — BP 132/78 | HR 65 | Ht 67.0 in | Wt 216.0 lb

## 2024-05-24 DIAGNOSIS — J842 Lymphoid interstitial pneumonia: Secondary | ICD-10-CM

## 2024-05-24 DIAGNOSIS — R0609 Other forms of dyspnea: Secondary | ICD-10-CM | POA: Diagnosis not present

## 2024-05-24 NOTE — Progress Notes (Signed)
 @Patient  ID: Michael Bernard, male    DOB: 1956/10/07, 68 y.o.   MRN: [id number]  Chief Complaint  Patient presents with   Follow-up    Referring provider: Johnny Garnette LABOR, MD  HPI:   68 y.o. man we are seeing for evaluation of shortness of breath, cough as well as cystic-like lesions on CT scan, most likely LIP.  Multiple pulmonary notes from [facility] reviewed.  Doing okay.  Has dyspnea.  Repeat CT scan of the interim with unchanged, stable LIP findings.  Enlarged heart noted.  Referral to cardiology was sent.  Has not been scheduled.  Unclear why.  He has upcoming scheduled appointment with neurology to help with the burning mouth.  Pregabalin  has been more helpful.  That is encouraging.  Has been dealing with this for some time.   HPI at initial visit: Patient describes onset of dyspnea on exertion starting around the time he retired.  Couple years ago.  Started prior to retirement.  Previously, during his work, he walked 10+ miles a day.  Likely even more.  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  But again, for the most of his career he did not have any shortness of breath.  Walked quite extensively, was very active.  Since retirement over the last couple years progressive dyspnea as described.  On exertion.  Worse on inclines or stairs in particular.  No time of day when things are better or worse.  No position make things better or worse.  No other relieving or exacerbating factors.  Has tried albuterol but does not think it is helped very much.  He does note he had several sinus infections, viral infections over the last few weeks to months which limits his ability to assess the effectiveness.  In addition, he describes a productive cough.  Present for years.  Possibly for a decade or more.  Productive of phlegm.  Worse in the mornings shortly after waking up.  He does endorse nasal congestion.  Denies much  rhinorrhea or postnasal drip.  He reports a hiatal hernia.  History of heartburn.  Although does not think his symptoms are very bad..  Recently he was prescribed Arnuity for presumed asthma given productive cough and shortness of breath.  He is yet to start this.  It sounds like his PCP was concerned for possible stroke in 2021.  An outpatient CTA neck as well as CT head was ordered.  On the visible upper portion of the lung, apices and upper portion of bilateral upper lobes reveals scattered cystic findings within the walls.  With ongoing recent workup for shortness of breath by his allergist, a CT chest was ordered which shows scattered thin-walled cystic lesions throughout both in the periphery and central areas otherwise clear lungs on my review interpretation.  It appears he has a immunodeficiency diagnosed by his allergist.  The feeling is that may be the reason why he has so many recurrent sinus infections etc.  He does endorse history of pneumonia as a child.  Several when he was a child.  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  He describes the  symptoms in detail as well as improvement with pregabalin .  Seems worse throughout the day better in the mornings.  He wears CPAP for OSA and he thinks with his mouth being close while he sleeps the sensation of burning is improved but as he talks and agrees to his mouth during the day the burning worsens.  He denies history of cigarette smoking.  Smokes cigars 1 or so a day for a couple years from time of 86 early 84s.  Has quit and no smoking since then.  PFTs in 2022 fully interpreted below but demonstrate normal versus mild reduction in TLC and DLCO.  He had serial spirometry in the interim which  shows decreased FEV1 and FVC.  He had TTE 05/2021 that demonstrates normal valves as well as normal right-sided Cavities and normal estimated PASP.  He had a left heart cath 05/2021 that showed 20% RCA lesion, otherwise no significant lesions, nonobstructive CAD, LVEDP reported at 10.  Questionaires / Pulmonary Flowsheets:   ACT:  Asthma Control Test ACT Total Score  12/27/2022 11:00 AM 13    MMRC:     No data to display          Epworth:     11/07/2017    1:00 PM  Results of the Epworth flowsheet  Sitting and reading 3  Watching TV 3  Sitting, inactive in a public place (e.g. a theatre or a meeting) 3  As a passenger in a car for an hour without a break 0  Lying down to rest in the afternoon when circumstances permit 3  Sitting and talking to someone 0  Sitting quietly after a lunch without alcohol 0  In a car, while stopped for a few minutes in traffic 0  Total score 12    Tests:   FENO:  No results found for: NITRICOXIDE  PFT:    Latest Ref Rng & Units 02/07/2018    9:59 AM  PFT Results  FVC-Pre L 3.53   FVC-Predicted Pre % 79   FVC-Post L 3.49   FVC-Predicted Post % 78   Pre FEV1/FVC % % 83   Post FEV1/FCV % % 83   FEV1-Pre L 2.92   FEV1-Predicted Pre % 87   FEV1-Post L 2.90   DLCO uncorrected ml/min/mmHg 23.00   DLCO UNC% % 77   DLVA Predicted % 100   TLC L 5.09   TLC % Predicted % 77   RV % Predicted % 75   Prior PFT 2018 personally reviewed and interpreted as spirometry just of moderate striction versus air trapping, no bronchodilator response, lung volumes not reported low low normal but TLC is 77% of predicted, mildly reduced to right lower limit of normal likely, DLCO similarly 77% predicted, suspect normal versus mildly reduced.  Given mild reduction in TLC and DLCO, this likely reflects underlying mild interstitial lung disease.  WALK:      No data to display          Imaging: Personally reviewed and as per EMR discussion this note No  results found.  Lab Results: Personally reviewed CBC    Component Value Date/Time   WBC 6.0 05/17/2024 0816   WBC 7.0 01/27/2024 1047   RBC 5.50 05/17/2024 0816   HGB 17.1 (H) 05/17/2024 0816   HCT 49.0 05/17/2024 0816   PLT 288 05/17/2024 0816   MCV 89.1 05/17/2024 0816   MCH 31.1 05/17/2024 0816   MCHC 34.9 05/17/2024 0816   RDW 12.9 05/17/2024 0816  LYMPHSABS 1.7 05/17/2024 0816   MONOABS 0.6 05/17/2024 0816   EOSABS 0.2 05/17/2024 0816   BASOSABS 0.1 05/17/2024 0816    BMET    Component Value Date/Time   NA 139 02/15/2024 1002   K 4.1 02/15/2024 1002   CL 106 02/15/2024 1002   CO2 28 02/15/2024 1002   GLUCOSE 93 02/15/2024 1002   BUN 13 02/15/2024 1002   CREATININE 0.98 02/15/2024 1002   CREATININE 0.97 10/14/2020 1141   CALCIUM 9.2 02/15/2024 1002   GFRNONAA >60 02/15/2024 1002   GFRNONAA 83 10/14/2020 1141   GFRAA 96 10/14/2020 1141    BNP No results found for: BNP  ProBNP No results found for: PROBNP  Specialty Problems       Pulmonary Problems   Dyspnea on exertion   PFT 02/07/18- Mild restriction, mild Diffusion reduction, FVC 3.49/78%, FEV1 2.90/86%, ratio 0.83, FEF 25-75% 3.01/108%, No R to BD, TLC 77%, DLCO 77%      Obstructive sleep apnea   HST 12/14/17-AHI 37.9/hour, desaturation to 68%, body weight 225 pounds      Seasonal and perennial allergic rhinitis   Remote allergy  skin testing positive for dust mite and other common environmental allergens.      ILD (interstitial lung disease) (HCC)   LIP (lymphoid interstitial pneumonitis) (HCC)    Allergies  Allergen Reactions   Trazodone  And Nefazodone Other (See Comments)    Feels hung over the next day     Immunization History  Administered Date(s) Administered   Influenza,inj,Quad PF,6+ Mos 08/24/2017, 08/31/2018   Influenza-Unspecified 10/07/2020   PFIZER(Purple Top)SARS-COV-2 Vaccination 02/05/2020, 03/11/2020, 10/23/2020   Td 04/16/2023    Past Medical History:   Diagnosis Date   Anxiety    situational anxiety   Arthritis    LEFT knee   Asthma    Cancer (HCC), Melanoma    skin x 3-4 times   GERD (gastroesophageal reflux disease)    on meds   Hiatal hernia    Kidney stones    Multiple gastric ulcers 2015   seen on endoscopy per Dr. Charlanne in [place]    Seasonal allergies    Sleep apnea    sees Dr. Quita Salt, uses CPAP     Tobacco History: Social History   Tobacco Use  Smoking Status Former   Current packs/day: 0.00   Types: Cigarettes   Start date: 06/10/1976   Quit date: 06/11/1979   Years since quitting: 44.9  Smokeless Tobacco Never  Tobacco Comments   pt reports smoker 0.5 pack/month as a teenager 06/11/2019   Counseling given: Not Answered Tobacco comments: pt reports smoker 0.5 pack/month as a teenager 06/11/2019   Continue to not smoke  Outpatient Encounter Medications as of 05/24/2024  Medication Sig   albuterol (VENTOLIN HFA) 108 (90 Base) MCG/ACT inhaler Inhale into the lungs every 6 (six) hours as needed for wheezing or shortness of breath.   Azelastine -Fluticasone  (DYMISTA ) 137-50 MCG/ACT SUSP Place into the nose.   azithromycin  (ZITHROMAX ) 250 MG tablet Take 250 mg every Monday Wednesday Friday for infection prevention for immunodeficiency   clobetasol  ointment (TEMOVATE ) 0.05 % Apply topically twice daily to BODY as needed for SEVERE red, sandpaper and thickened like rash.  Do not use on face, groin or armpits.  Use for up to two weeks at a time.   loperamide  (IMODIUM ) 2 MG capsule Take 1 capsule (2 mg total) by mouth 4 (four) times daily as needed for diarrhea or loose stools.   meclizine  (ANTIVERT ) 25  MG tablet Take 1 tablet (25 mg total) by mouth every 4 (four) hours as needed for dizziness.   MECLIZINE  HCL PO Take by mouth. (Patient not taking: Reported on 03/21/2024)   methylPREDNISolone  (MEDROL  DOSEPAK) 4 MG TBPK tablet As directed (Patient not taking: Reported on 03/21/2024)   omeprazole  (PRILOSEC) 40 MG  capsule TAKE 1 CAPSULE (40 MG TOTAL) BY MOUTH DAILY.   pregabalin  (LYRICA ) 300 MG capsule TAKE 1 CAPSULE BY MOUTH EVERY DAY   TESSALON  PERLES 100 MG capsule Take 1 capsule (100 mg total) by mouth 3 (three) times daily as needed for cough. (Patient not taking: Reported on 03/21/2024)   zolpidem  (AMBIEN ) 10 MG tablet TAKE 1 TABLET BY MOUTH AT BEDTIME FOR SLEEP IF NEEDED   No facility-administered encounter medications on file as of 05/24/2024.     Review of Systems  Review of Systems  N/a Physical Exam  BP 132/78 (BP Location: Left Arm, Patient Position: Sitting, Cuff Size: Large)   Pulse 65   Ht 5' 7 (1.702 m)   Wt 216 lb (98 kg)   SpO2 97%   BMI 33.83 kg/m   Wt Readings from Last 5 Encounters:  05/24/24 216 lb (98 kg)  05/17/24 215 lb 4.8 oz (97.7 kg)  03/27/24 217 lb 1.6 oz (98.5 kg)  03/21/24 215 lb 3.2 oz (97.6 kg)  02/15/24 219 lb 1.6 oz (99.4 kg)    BMI Readings from Last 5 Encounters:  05/24/24 33.83 kg/m  05/17/24 33.72 kg/m  03/27/24 34.00 kg/m  03/21/24 33.71 kg/m  02/15/24 34.32 kg/m     Physical Exam General: Sitting in chair, no acute distress Eyes: EOMI, no icterus Neck: Supple, no JVP Pulmonary: Clear, normal work of breathing Cardiovascular: Warm, no edema, regular rhythm Abdomen: Nondistended, bowel sounds present MSK: No synovitis , no joint effusion Neuro: Normal gait, no weakness Psych: Normal mood, full affect   Assessment & Plan:   Chronic cough: Largely related to postnasal drip symptoms, he uses nasal sprays as needed based on severity of this symptom.  Possible contribution of asthma given shortness of breath and productive cough.  Worse with Arnuity, DPI in the past.  He did not complain of cough today.  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  Given his history of pneumonias and recurrent infections pneumatoceles are considered.  There are thin-walled cyst.  He has  burning mouth.  Do wonder if this is a manifestation of underlying Sjogren's although review of literature indicates not a clear connection.  With a thin-walled cysts, burning mouth, as well as report of her diagnosis of immunodeficiency per immunologist of specific antibody deficiency with normal immunoglobulin concentration and normal number of B cells, suspicion for LIP relatively high even though epidemiologically it is rare.  Other considerations include [facility] but given lack of smoking history this felt less likely.  Adeline Mirza Dubay considered but given other history felt less likely, no characteristic skin manifestations as well.  Seen by [facility] in the interim in 2024, PFTs normal, imaging stable, ultimately agree with clinical diagnosis of LIP. Given stability disease, no further recommendations made.  Repeat CT scan 03/2024 stable.  Yet to have PFT scheduled, will obtain at time of next visit and review.  Dyspnea on exertion: Suspect related to mild additional lung disease with mild reduction in TLC, mild restriction in the past.  Enlarged heart on most recent CT scan.  Referral to cardiology sent in May.  This is yet to  be scheduled.  Will send a message to schedule to find out what the delay is.  Trial of Breztri via samples today.  Inhalers have not helped in the past per her report.  Return in about 6 months (around 11/24/2024) for f/u Dr. Annella, after PFT.   Donnice JONELLE Annella, MD 05/24/2024

## 2024-05-24 NOTE — Patient Instructions (Signed)
 Neck to see you again  CT scan is stable which is great news  I will send a message to find out about the referral to cardiology, I think getting their evaluation of your shortness of breath is important  I would like to repeat pulmonary function test at your next visit just to keep an eye on things, make sure the lung function is stable  Return to clinic in 6 months with pulmonary function test scheduled same day with Dr. Annella

## 2024-06-01 ENCOUNTER — Ambulatory Visit: Admitting: Diagnostic Neuroimaging

## 2024-06-07 ENCOUNTER — Ambulatory Visit: Admitting: Diagnostic Neuroimaging

## 2024-06-27 ENCOUNTER — Encounter: Payer: Self-pay | Admitting: Diagnostic Neuroimaging

## 2024-06-27 ENCOUNTER — Telehealth: Payer: Self-pay | Admitting: Diagnostic Neuroimaging

## 2024-06-27 ENCOUNTER — Encounter

## 2024-06-27 ENCOUNTER — Ambulatory Visit: Admitting: Diagnostic Neuroimaging

## 2024-06-27 VITALS — BP 128/76 | HR 87 | Ht 68.0 in | Wt 219.0 lb

## 2024-06-27 DIAGNOSIS — K146 Glossodynia: Secondary | ICD-10-CM | POA: Diagnosis not present

## 2024-06-27 DIAGNOSIS — G510 Bell's palsy: Secondary | ICD-10-CM | POA: Diagnosis not present

## 2024-06-27 NOTE — Patient Instructions (Signed)
  Recurrent bells palsy + right tongue / mouth burning sensation - check MRI brain / IAC (with and without) - continue pregabalin  300mg  at bedtime

## 2024-06-27 NOTE — Telephone Encounter (Signed)
 no auth required sent to GI [phone]

## 2024-06-27 NOTE — Progress Notes (Signed)
 [facility]  PATIENT: Michael Bernard DOB: 03-Sep-1956  REFERRING CLINICIAN: Lorin Norris, MD HISTORY FROM: patient  REASON FOR VISIT: new consult   HISTORICAL  CHIEF COMPLAINT:  Chief Complaint  Patient presents with   Bells Palsy     Rm 6 alone Pt is well, reports he has been having R sided bells pasly, Burning mouth since May 2023 and mentions a R hand wound in March that he was concerned for Shingles.  He has a hard time eating and facial pain if he talks a lot.  He mentions he use to work in [employer] most of his life.     HISTORY OF PRESENT ILLNESS:   68 year old male with recurrent Bell's palsy and burning mouth syndrome.  2009 patient had episode of left Bell's palsy, which was diagnosed and treated with good recovery.  May 2023 patient had onset of burning painful sensation in his right tongue and right buccal mucosa and lips.  He went to dentist, ENT, dermatology and allergist for evaluation.  He was diagnosed with some type of immunodeficiency and recommend to start IVIG but this has not been started yet.  He tried some antifungal and oral treatments without benefit.  Also has been diagnosed with lymphoid interstitial pneumonitis.  Had an episode of rash on his right hand which was possible shingles but not clear-cut.  March 2025 had onset of right-sided Bell's palsy which was treated and now has resolved.    REVIEW OF SYSTEMS: Full 14 system review of systems performed and negative with exception of: as per HPI.  ALLERGIES: Allergies  Allergen Reactions   Trazodone  And Nefazodone Other (See Comments)    Feels hung over the next day     HOME MEDICATIONS: Outpatient Medications Prior to Visit  Medication Sig Dispense Refill   albuterol (VENTOLIN HFA) 108 (90 Base) MCG/ACT inhaler Inhale into the lungs every 6 (six) hours as needed for wheezing or shortness of breath.     Azelastine -Fluticasone  (DYMISTA ) 137-50 MCG/ACT SUSP  Place into the nose.     azithromycin  (ZITHROMAX ) 250 MG tablet Take 250 mg every Monday Wednesday Friday for infection prevention for immunodeficiency 12 each 6   clobetasol  ointment (TEMOVATE ) 0.05 % Apply topically twice daily to BODY as needed for SEVERE red, sandpaper and thickened like rash.  Do not use on face, groin or armpits.  Use for up to two weeks at a time. 60 g 1   omeprazole  (PRILOSEC) 40 MG capsule TAKE 1 CAPSULE (40 MG TOTAL) BY MOUTH DAILY. 90 capsule 3   pregabalin  (LYRICA ) 300 MG capsule TAKE 1 CAPSULE BY MOUTH EVERY DAY 90 capsule 1   zolpidem  (AMBIEN ) 10 MG tablet TAKE 1 TABLET BY MOUTH AT BEDTIME FOR SLEEP IF NEEDED 90 tablet 1   meclizine  (ANTIVERT ) 25 MG tablet Take 1 tablet (25 mg total) by mouth every 4 (four) hours as needed for dizziness. (Patient not taking: Reported on 06/27/2024) 60 tablet 3   No facility-administered medications prior to visit.    PAST MEDICAL HISTORY: Past Medical History:  Diagnosis Date   Anxiety    situational anxiety   Arthritis    LEFT knee   Asthma    Cancer (HCC), Melanoma    skin x 3-4 times   GERD (gastroesophageal reflux disease)    on meds   Hiatal hernia    Kidney stones    Multiple gastric ulcers 2015   seen on endoscopy per Dr. Charlanne in [place]  Seasonal allergies    Sleep apnea    sees Dr. Quita Salt, uses CPAP     PAST SURGICAL HISTORY: Past Surgical History:  Procedure Laterality Date   COLONOSCOPY  07/20/2022   per Dr. Lynnie Bring, adenomatous polyps, repeat in 3 yrs   HERNIA REPAIR Bilateral    inguinal hernias repaired with mesh   KNEE SURGERY Left    arthritis and meniscal tear   LEFT HEART CATH AND CORONARY ANGIOGRAPHY N/A 05/22/2021   Procedure: LEFT HEART CATH AND CORONARY ANGIOGRAPHY;  Surgeon: Verlin Lonni BIRCH, MD;  Location: [facility];  Service: Cardiovascular;  Laterality: N/A;   MELANOMA EXCISION     per Dr. Trudy in [place], 3 removed from both shoulders and left temple     POLYPECTOMY  2017   TA    FAMILY HISTORY: Family History  Problem Relation Age of Onset   Colon polyps Mother    Arthritis Mother    Cancer Mother    Hyperlipidemia Mother    Hypertension Mother    Leukemia Mother    Atrial fibrillation Father    Heart attack Father    Heart disease Father    Hypertension Father    Hypertension Sister    Seizures Sister    Colon cancer Neg Hx    Esophageal cancer Neg Hx    Stomach cancer Neg Hx    Rectal cancer Neg Hx     SOCIAL HISTORY: Social History   Socioeconomic History   Marital status: Married    Spouse name: Not on file   Number of children: Not on file   Years of education: Not on file   Highest education level: Associate degree: academic program  Occupational History   Occupation: retired  Tobacco Use   Smoking status: Former    Current packs/day: 0.00    Types: Cigarettes    Start date: 06/10/1976    Quit date: 06/11/1979    Years since quitting: 45.0   Smokeless tobacco: Never   Tobacco comments:    pt reports smoker 0.5 pack/month as a teenager 06/11/2019  Vaping Use   Vaping status: Never Used  Substance and Sexual Activity   Alcohol use: Yes    Alcohol/week: 1.0 - 2.0 standard drink of alcohol    Types: 1 - 2 Cans of beer per week    Comment: 1-2 beers per month   Drug use: Never   Sexual activity: Yes  Other Topics Concern   Not on file  Social History Narrative   Not on file   Social Drivers of Health   Financial Resource Strain: Low Risk  (01/26/2024)   Overall Financial Resource Strain (CARDIA)    Difficulty of Paying Living Expenses: Not hard at all  Food Insecurity: No Food Insecurity (02/15/2024)   Hunger Vital Sign    Worried About Running Out of Food in the Last Year: Never true    Ran Out of Food in the Last Year: Never true  Transportation Needs: No Transportation Needs (02/15/2024)   PRAPARE - Administrator, Civil Service (Medical): No    Lack of Transportation  (Non-Medical): No  Physical Activity: Inactive (01/26/2024)   Exercise Vital Sign    Days of Exercise per Week: 0 days    Minutes of Exercise per Session: 0 min  Stress: Stress Concern Present (01/26/2024)   [employer] of Occupational Health - Occupational Stress Questionnaire    Feeling of Stress : To some extent  Social  Connections: Socially Integrated (01/26/2024)   Social Connection and Isolation Panel    Frequency of Communication with Friends and Family: More than three times a week    Frequency of Social Gatherings with Friends and Family: Once a week    Attends Religious Services: More than 4 times per year    Active Member of [employer] or Organizations: No    Attends Engineer, structural: More than 4 times per year    Marital Status: Married  Catering manager Violence: Not At Risk (02/15/2024)   Humiliation, Afraid, Rape, and Kick questionnaire    Fear of Current or Ex-Partner: No    Emotionally Abused: No    Physically Abused: No    Sexually Abused: No     PHYSICAL EXAM  GENERAL EXAM/CONSTITUTIONAL: Vitals:  Vitals:   06/27/24 1138  BP: 128/76  Pulse: 87  Weight: 219 lb (99.3 kg)  Height: 5' 8 (1.727 m)   Body mass index is 33.3 kg/m. Wt Readings from Last 3 Encounters:  06/27/24 219 lb (99.3 kg)  05/24/24 216 lb (98 kg)  05/17/24 215 lb 4.8 oz (97.7 kg)   Patient is in no distress; well developed, nourished and groomed; neck is supple  CARDIOVASCULAR: Examination of carotid arteries is normal; no carotid bruits Regular rate and rhythm, no murmurs Examination of peripheral vascular system by observation and palpation is normal  EYES: Ophthalmoscopic exam of optic discs and posterior segments is normal; no papilledema or hemorrhages No results found.  MUSCULOSKELETAL: Gait, strength, tone, movements noted in Neurologic exam below  NEUROLOGIC: MENTAL STATUS:      No data to display         awake, alert, oriented to person, place and  time recent and remote memory intact normal attention and concentration language fluent, comprehension intact, naming intact fund of knowledge appropriate  CRANIAL NERVE:  2nd - no papilledema on fundoscopic exam 2nd, 3rd, 4th, 6th - pupils equal and reactive to light, visual fields full to confrontation, extraocular muscles intact, no nystagmus 5th - facial sensation symmetric 7th - facial strength symmetric 8th - hearing intact 9th - palate elevates symmetrically, uvula midline 11th - shoulder shrug symmetric 12th - tongue protrusion midline  MOTOR:  normal bulk and tone, full strength in the BUE, BLE  SENSORY:  normal and symmetric to light touch, temperature, vibration  COORDINATION:  finger-nose-finger, fine finger movements normal  REFLEXES:  deep tendon reflexes present and symmetric  GAIT/STATION:  narrow based gait     DIAGNOSTIC DATA (LABS, IMAGING, TESTING) - I reviewed patient records, labs, notes, testing and imaging myself where available.  Lab Results  Component Value Date   WBC 6.0 05/17/2024   HGB 17.1 (H) 05/17/2024   HCT 49.0 05/17/2024   MCV 89.1 05/17/2024   PLT 288 05/17/2024      Component Value Date/Time   NA 139 02/15/2024 1002   K 4.1 02/15/2024 1002   CL 106 02/15/2024 1002   CO2 28 02/15/2024 1002   GLUCOSE 93 02/15/2024 1002   BUN 13 02/15/2024 1002   CREATININE 0.98 02/15/2024 1002   CREATININE 0.97 10/14/2020 1141   CALCIUM 9.2 02/15/2024 1002   PROT 7.6 02/15/2024 1002   ALBUMIN 4.0 02/15/2024 1002   AST 19 02/15/2024 1002   ALT 19 02/15/2024 1002   ALKPHOS 67 02/15/2024 1002   BILITOT 0.9 02/15/2024 1002   GFRNONAA >60 02/15/2024 1002   GFRNONAA 83 10/14/2020 1141   GFRAA 96 10/14/2020 1141   Lab  Results  Component Value Date   CHOL 105 01/30/2024   HDL 32.10 (L) 01/30/2024   LDLCALC 48 01/30/2024   TRIG 126.0 01/30/2024   CHOLHDL 3 01/30/2024   Lab Results  Component Value Date   HGBA1C 5.6 01/27/2024    Lab Results  Component Value Date   VITAMINB12 288 01/30/2024   Lab Results  Component Value Date   TSH 3.55 01/30/2024       ASSESSMENT AND PLAN  68 y.o. year old male here with:   Dx:  1. Bell's palsy   2. Burning mouth syndrome     PLAN:  Recurrent bells palsy + right tongue / mouth burning sensation - check MRI brain / IAC (with and without) to rule out other causes - continue pregabalin  300mg  at bedtime  Orders Placed This Encounter  Procedures   MR BRAIN/IAC W WO CONTRAST   Return for pending if symptoms worsen or fail to improve, pending test results.    EDUARD FABIENE HANLON, MD 06/27/2024, 12:22 PM Certified in Neurology, Neurophysiology and Neuroimaging  [facility] [address], [address] [place], [place] [address] [phone]

## 2024-06-28 ENCOUNTER — Other Ambulatory Visit: Payer: Self-pay | Admitting: Diagnostic Neuroimaging

## 2024-06-28 ENCOUNTER — Ambulatory Visit: Admitting: Internal Medicine

## 2024-06-28 VITALS — BP 118/78 | HR 82 | Temp 97.7°F

## 2024-06-28 DIAGNOSIS — G510 Bell's palsy: Secondary | ICD-10-CM | POA: Diagnosis not present

## 2024-06-28 DIAGNOSIS — J842 Lymphoid interstitial pneumonia: Secondary | ICD-10-CM | POA: Diagnosis not present

## 2024-06-28 DIAGNOSIS — K146 Glossodynia: Secondary | ICD-10-CM | POA: Diagnosis not present

## 2024-06-28 DIAGNOSIS — J3089 Other allergic rhinitis: Secondary | ICD-10-CM | POA: Diagnosis not present

## 2024-06-28 DIAGNOSIS — J0181 Other acute recurrent sinusitis: Secondary | ICD-10-CM

## 2024-06-28 DIAGNOSIS — D806 Antibody deficiency with near-normal immunoglobulins or with hyperimmunoglobulinemia: Secondary | ICD-10-CM

## 2024-06-28 MED ORDER — METHYLPREDNISOLONE ACETATE 40 MG/ML IJ SUSP
40.0000 mg | Freq: Once | INTRAMUSCULAR | Status: AC
  Administered 2024-06-28: 40 mg via INTRAMUSCULAR

## 2024-06-28 MED ORDER — AMOXICILLIN-POT CLAVULANATE 875-125 MG PO TABS: 1.0000 | ORAL_TABLET | Freq: Two times a day (BID) | ORAL | 0 refills | Status: AC

## 2024-06-28 NOTE — Progress Notes (Signed)
 Follow Up Note  RE: Michael Bernard MRN: [id number] DOB: Mar 31, 1956 Date of Office Visit: 06/28/2024  Referring provider: Johnny Garnette LABOR, MD Primary care provider: Johnny Garnette LABOR, MD  Chief Complaint: Cough and Nasal Congestion  History of Present Illness: I had the pleasure of seeing Michael Bernard for a follow up visit at the [facility] on 06/28/2024. He is a 68 y.o. male, who is being followed for SAD, persistent asthma, recurrent sinusitis, allergic rhinitis . His previous allergy  office visit was on 03/27/24 with Dr. Lorin. Today is a acute visit .  History obtained from patient, chart review.   Since last visit:  Heme (05/17/24): On his consultation with us  on 02/15/2024, labs actually showed improved hemoglobin of 16.9, hematocrit 50.  White count and platelet count were within normal limits.  CMP, iron studies, LDH, erythropoietin  levels were all unremarkable. JAK2 mutation with reflex testing for CALR, MPL, exon 12-15 mutations was also negative.  His ILD and sleep apnea may contribute to his secondary polycythemia.   -no indication for  therapeutic phlebotomy currently, f/u in 3 months   Pulm (05/24/24): Chronic cough: Largely related to postnasal drip symptoms, he uses nasal sprays as needed based on severity of this symptom.  Possible contribution of asthma given shortness of breath and productive cough.  Worse with Arnuity, DPI in the past.  He did not complain of cough today.   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  Given his history of pneumonias and recurrent infections pneumatoceles are considered.  There are thin-walled cyst.  He has burning mouth.  Do wonder if this is a manifestation of underlying Sjogren's although review of literature indicates not a clear connection.  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  Other considerations include Langerhans but given lack of smoking history this felt less likely.  Adeline Mirza Dubay considered but given other history felt less likely, no characteristic skin manifestations as well.  Seen by [facility] in the interim in 2024, PFTs normal, imaging stable, ultimately agree with clinical diagnosis of LIP. Given stability disease, no further recommendations made.  Repeat CT scan 03/2024 stable.  Yet to have PFT scheduled, will obtain at time of next visit and review.   Dyspnea on exertion: Suspect related to mild additional lung disease with mild reduction in TLC, mild restriction in the past.  Enlarged heart on most recent CT scan.  Referral to cardiology sent in May.  This is yet to be scheduled.  Will send a message to schedule to find out what the delay is.  Trial of Breztri via samples today.  Inhalers have not helped in the past per her report.   Neuro (06/27/24): Recurrent bells palsy + right tongue / mouth burning sensation - check MRI brain / IAC (with and without) to rule out other causes - continue pregabalin  300mg  at bedtime  Today he reports  Discussed the use of AI scribe software for clinical note transcription with the patient, who gave verbal consent to proceed.  History of Present Illness Michael Bernard is a 68 year old male with immunodeficiency and lung disease who presents with respiratory symptoms and medication management. He is accompanied by his wife.  Respiratory symptoms - onset  started  Saturday with nasal congestion,  - progressed to Wheezing, coughing and chest tightness, particularly when lying on left side - symptoms started Sunday night  - Oxygen saturation remains stable at 97-98% - No fever, with temperatures around 96F - Uses Breztri for respiratory symptoms -  Difficulty using CPAP due to nasal congestion and cough, resulting in disrupted sleep  Nasal congestion and cough - Nasal congestion and cough interfere with CPAP use and sleep quality - Uses Flonase  nasal spray, astelin  and cough syrup for symptom management  -minimal benefit   Immunodeficiency and medication management - History of immunodeficiency - Did not start hizentra due to cost  - Currently takes azithromycin  on Monday, Wednesday, and Friday   Cardiac evaluation - Scheduled for cardiology evaluation due to concern for heart enlargement   Assessment and Plan: Michael Bernard is a 68 y.o. 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)  Other acute recurrent sinusitis  LIP (lymphoid interstitial pneumonitis) (HCC)  Other allergic rhinitis  Bell's palsy  Burning mouth syndrome   Plan:    Patient Instructions  Specific Antibody Deficiency with acute infection and chest tightness  Continue  azithromycn 250mg  M/W/F  Depo medrol  40mg  IM given today  Start Augmentin  875/125mg  twice daily for 14 days    Recurrent bells palsy, fissured/burning tongue  -Continue work up per neurology  - Continue pregabalin  300mg  at bedtime   Dyspnea:  LIP Follow up with Pulmonary, defer further work up to pulmonary Will continue albuterol as needed Singulair continued for allergic rhinitis  Breztri 2 puffs twice daily as prescribed by pulmonary    Mixed Allergic and nonallergic Rhinitis:   - Testing 08/25/22: postive to  [place]  blue grass pollen and cocklebur weed pollen, intradermals were negative  -Continue avoidance measures - Continue taking: Zyrtec 10mg  daily as needed, Flonase  1 spray per nostril as needed for stuffy nose  and Singulair (montelukast) 10mg  daily - You can use an extra dose of the antihistamine, if needed, for breakthrough symptoms.  - Consider nasal saline rinses 1-2 times daily to remove allergens from the  nasal cavities as well as help with mucous clearance (this is especially helpful to do before the nasal sprays are given)   Follow up: 6 months   Thank you so much for letting me partake in your care today.  Don't hesitate to reach out if you have any additional concerns!  Michael Springer, MD  [facility] [place] [place]    Lab Orders  No laboratory test(s) ordered today    Diagnostics: None done    Medication List:  Current Outpatient Medications  Medication Sig Dispense Refill   amoxicillin -clavulanate (AUGMENTIN ) 875-125 MG tablet Take 1 tablet by mouth 2 (two) times daily for 14 days. 28 tablet 0   albuterol (VENTOLIN HFA) 108 (90 Base) MCG/ACT inhaler Inhale into the lungs every 6 (six) hours as needed for wheezing or shortness of breath.     Azelastine -Fluticasone  (DYMISTA ) 137-50 MCG/ACT SUSP Place into the nose.     azithromycin  (ZITHROMAX ) 250 MG tablet Take 250 mg every Monday Wednesday Friday for infection prevention for immunodeficiency 12 each 6   clobetasol  ointment (TEMOVATE ) 0.05 % Apply topically twice daily to BODY as needed for SEVERE red, sandpaper and thickened like rash.  Do not use on face, groin or armpits.  Use for up to two weeks at a time. 60 g 1   omeprazole  (PRILOSEC) 40 MG capsule TAKE  1 CAPSULE (40 MG TOTAL) BY MOUTH DAILY. 90 capsule 3   pregabalin  (LYRICA ) 300 MG capsule TAKE 1 CAPSULE BY MOUTH EVERY DAY 90 capsule 1   zolpidem  (AMBIEN ) 10 MG tablet TAKE 1 TABLET BY MOUTH AT BEDTIME FOR SLEEP IF NEEDED 90 tablet 1   No current facility-administered medications for this visit.   Allergies: Allergies  Allergen Reactions   Trazodone  And Nefazodone Other (See Comments)    Feels hung over the next day    I reviewed his past medical history, social history, family history, and environmental history and no significant changes have been reported from his previous visit.  ROS: All others negative except as noted per HPI.    Objective: BP 118/78   Pulse 82   Temp 97.7 F (36.5 C) (Temporal)   SpO2 97%  There is no height or weight on file to calculate BMI. General Appearance:  Tired appearing, cooperative, no distress, appears  tired  Head:  Normocephalic, without obvious abnormality, atraumatic  Eyes:  Conjunctiva clear, EOM's intact  Nose: Nares normal, erythematous and edematous nasal mucosa, no visible anterior polyps, and septum midline  Throat: Lips, fissured tongue; teeth and gums normal, + cobblestoning  Neck: Supple, symmetrical  Lungs:   No wheezing, rhonchi present , Respirations unlabored, bronchitic cough   Heart:  regular rate and rhythm and no murmur, Appears well perfused  Extremities: No edema  Skin: Skin color, texture, turgor normal, no rashes or lesions on visualized portions of skin  Neurologic: No gross deficits   Previous notes and tests were reviewed. The plan was reviewed with the patient/family, and all questions/concerned were addressed.  It was my pleasure to see Michael Bernard today and participate in his care. Please feel free to contact me with any questions or concerns.  Sincerely,  Michael Springer, MD  Allergy  & Immunology  [facility]  [place] Office: [phone]

## 2024-06-28 NOTE — Patient Instructions (Signed)
 Specific Antibody Deficiency with acute infection and chest tightness  Continue  azithromycn 250mg  M/W/F  Depo medrol  40mg  IM given today  Start Augmentin  875/125mg  twice daily for 14 days    Recurrent bells palsy, fissured/burning tongue  -Continue work up per neurology  - Continue pregabalin  300mg  at bedtime   Dyspnea:  LIP Follow up with Pulmonary, defer further work up to pulmonary Will continue albuterol as needed Singulair continued for allergic rhinitis  Breztri 2 puffs twice daily as prescribed by pulmonary    Mixed Allergic and nonallergic Rhinitis:   - Testing 08/25/22: postive to  [place]  blue grass pollen and cocklebur weed pollen, intradermals were negative  -Continue avoidance measures - Continue taking: Zyrtec 10mg  daily as needed, Flonase  1 spray per nostril as needed for stuffy nose  and Singulair (montelukast) 10mg  daily - You can use an extra dose of the antihistamine, if needed, for breakthrough symptoms.  - Consider nasal saline rinses 1-2 times daily to remove allergens from the nasal cavities as well as help with mucous clearance (this is especially helpful to do before the nasal sprays are given)   Follow up: 6 months   Thank you so much for letting me partake in your care today.  Don't hesitate to reach out if you have any additional concerns!  Hargis Springer, MD  [facility] [place] [place]

## 2024-06-29 ENCOUNTER — Encounter: Payer: Self-pay | Admitting: Internal Medicine

## 2024-06-29 ENCOUNTER — Ambulatory Visit: Attending: Internal Medicine | Admitting: Internal Medicine

## 2024-06-29 VITALS — BP 124/70 | HR 70 | Ht 68.0 in | Wt 217.2 lb

## 2024-06-29 DIAGNOSIS — I517 Cardiomegaly: Secondary | ICD-10-CM | POA: Insufficient documentation

## 2024-06-29 DIAGNOSIS — Z79899 Other long term (current) drug therapy: Secondary | ICD-10-CM

## 2024-06-29 DIAGNOSIS — G510 Bell's palsy: Secondary | ICD-10-CM

## 2024-06-29 DIAGNOSIS — R0609 Other forms of dyspnea: Secondary | ICD-10-CM | POA: Diagnosis not present

## 2024-06-29 NOTE — Patient Instructions (Addendum)
 Medication Instructions:   Continue all current medications.    Labwork:  BNP - order given today  Testing/Procedures:  Your physician has requested that you have an echocardiogram. Echocardiography is a painless test that uses sound waves to create images of your heart. It provides your doctor with information about the size and shape of your heart and how well your heart's chambers and valves are working. This procedure takes approximately one hour. There are no restrictions for this procedure. Please do NOT wear cologne, perfume, aftershave, or lotions (deodorant is allowed). Please arrive 15 minutes prior to your appointment time.  Please note: We ask at that you not bring children with you during ultrasound (echo/ vascular) testing. Due to room size and safety concerns, children are not allowed in the ultrasound rooms during exams. Our front office staff cannot provide observation of children in our lobby area while testing is being conducted. An adult accompanying a patient to their appointment will only be allowed in the ultrasound room at the discretion of the ultrasound technician under special circumstances. We apologize for any inconvenience.  Follow-Up:  Office will contact with results via phone, letter or mychart.    3 months   Any Other Special Instructions Will Be Listed Below (If Applicable).   If you need a refill on your cardiac medications before your next appointment, please call your pharmacy.

## 2024-06-29 NOTE — Progress Notes (Signed)
 Cardiology Office Note  Date: 06/29/2024   ID: Emari, Demmer [birth year], MRN [id number]  PCP:  Johnny Garnette LABOR, MD  Cardiologist:  Diannah SHAUNNA Maywood, MD Electrophysiologist:  None   History of Present Illness: Michael Bernard is a 68 y.o. male  Referred to cardiology clinic for evaluation of imaging evidence of enlarged heart.  He had DOE since COVID-19 infection in 2021.  He had COVID 4 times since then.  DOE getting worse.  He also has exertional chest pain and DOE for a long time, since 2021, underwent LHC in 2022 that showed mild nonobstructive CAD.  Echocardiogram in 2022 was also unremarkable.  He continues to have exertional chest pain.  Does not have any other symptoms of syncope, palpitations or leg swelling.  Past Medical History:  Diagnosis Date   Anxiety    situational anxiety   Arthritis    LEFT knee   Asthma    Cancer (HCC), Melanoma    skin x 3-4 times   GERD (gastroesophageal reflux disease)    on meds   Hiatal hernia    Kidney stones    Multiple gastric ulcers 2015   seen on endoscopy per Dr. Charlanne in [place]    Seasonal allergies    Sleep apnea    sees Dr. Quita Salt, uses CPAP     Past Surgical History:  Procedure Laterality Date   COLONOSCOPY  07/20/2022   per Dr. Lynnie Charlanne, adenomatous polyps, repeat in 3 yrs   HERNIA REPAIR Bilateral    inguinal hernias repaired with mesh   KNEE SURGERY Left    arthritis and meniscal tear   LEFT HEART CATH AND CORONARY ANGIOGRAPHY N/A 05/22/2021   Procedure: LEFT HEART CATH AND CORONARY ANGIOGRAPHY;  Surgeon: Verlin Lonni BIRCH, MD;  Location: [facility];  Service: Cardiovascular;  Laterality: N/A;   MELANOMA EXCISION     per Dr. Trudy in [place], 3 removed from both shoulders and left temple    POLYPECTOMY  2017   TA    Current Outpatient Medications  Medication Sig Dispense Refill   albuterol (VENTOLIN HFA) 108 (90 Base) MCG/ACT inhaler Inhale into the lungs every  6 (six) hours as needed for wheezing or shortness of breath.     amoxicillin -clavulanate (AUGMENTIN ) 875-125 MG tablet Take 1 tablet by mouth 2 (two) times daily for 14 days. 28 tablet 0   Azelastine -Fluticasone  (DYMISTA ) 137-50 MCG/ACT SUSP Place into the nose.     azithromycin  (ZITHROMAX ) 250 MG tablet Take 250 mg every Monday Wednesday Friday for infection prevention for immunodeficiency 12 each 6   clobetasol  ointment (TEMOVATE ) 0.05 % Apply topically twice daily to BODY as needed for SEVERE red, sandpaper and thickened like rash.  Do not use on face, groin or armpits.  Use for up to two weeks at a time. 60 g 1   omeprazole  (PRILOSEC) 40 MG capsule TAKE 1 CAPSULE (40 MG TOTAL) BY MOUTH DAILY. 90 capsule 3   pregabalin  (LYRICA ) 300 MG capsule TAKE 1 CAPSULE BY MOUTH EVERY DAY 90 capsule 1   zolpidem  (AMBIEN ) 10 MG tablet TAKE 1 TABLET BY MOUTH AT BEDTIME FOR SLEEP IF NEEDED 90 tablet 1   No current facility-administered medications for this visit.   Allergies:  Trazodone  and nefazodone   Social History: The patient  reports that he quit smoking about 45 years ago. His smoking use included cigarettes. He started smoking about 48 years ago. He has never used smokeless tobacco. He  reports current alcohol use of about 1.0 - 2.0 standard drink of alcohol per week. He reports that he does not use drugs.   Family History: The patient's family history includes Arthritis in his mother; Atrial fibrillation in his father; Cancer in his mother; Colon polyps in his mother; Heart attack in his father; Heart disease in his father; Hyperlipidemia in his mother; Hypertension in his father, mother, and sister; Leukemia in his mother; Seizures in his sister.   ROS:  Please see the history of present illness. Otherwise, complete review of systems is positive for none  All other systems are reviewed and negative.   Physical Exam: VS:  BP 124/70 (BP Location: Left Arm, Patient Position: Sitting, Cuff Size: Large)    Pulse 70   Ht 5' 8 (1.727 m)   Wt 217 lb 3.2 oz (98.5 kg)   SpO2 96%   BMI 33.03 kg/m , BMI Body mass index is 33.03 kg/m.  Wt Readings from Last 3 Encounters:  06/29/24 217 lb 3.2 oz (98.5 kg)  06/27/24 219 lb (99.3 kg)  05/24/24 216 lb (98 kg)    General: Patient appears comfortable at rest. HEENT: Conjunctiva and lids normal, oropharynx clear with moist mucosa. Neck: Supple, no elevated JVP or carotid bruits, no thyromegaly. Lungs: Clear to auscultation, nonlabored breathing at rest. Cardiac: Regular rate and rhythm, no S3 or significant systolic murmur, no pericardial rub. Abdomen: Soft, nontender, no hepatomegaly, bowel sounds present, no guarding or rebound. Extremities: No pitting edema, distal pulses 2+. Skin: Warm and dry. Musculoskeletal: No kyphosis. Neuropsychiatric: Alert and oriented x3, affect grossly appropriate.  Recent Labwork: 01/30/2024: TSH 3.55 02/15/2024: ALT 19; AST 19; BUN 13; Creatinine 0.98; Potassium 4.1; Sodium 139 05/17/2024: Hemoglobin 17.1; Platelet Count 288     Component Value Date/Time   CHOL 105 01/30/2024 0925   TRIG 126.0 01/30/2024 0925   HDL 32.10 (L) 01/30/2024 0925   CHOLHDL 3 01/30/2024 0925   VLDL 25.2 01/30/2024 0925   LDLCALC 48 01/30/2024 0925   LDLCALC 64 10/14/2020 1141    Other Studies Reviewed Today:   Assessment and Plan:  Imaging evidence of enlarged heart - DOE since COVID-19 infection in 2021.  Getting worse.  He also has clinical diagnosis of lymphoid and residual pneumonia.  PFTs normal.  Follows with pulmonology. - Echocardiogram and LHC in 2022 were unremarkable. - Repeat echocardiogram. - Obtain BNP.  Cardiac chest pain - Exertional chest pain and DOE since 2021.  LHC and echocardiogram in 2022 were unremarkable.  Repeat echocardiogram. - Unclear if he has any diastolic dysfunction contributing to chest pain or if he has any coronary microvascular dysfunction.  Nonetheless, he probably might benefit from  medical management versus obtaining cardiac stress PET in the future.  Will reevaluate his symptoms at 55-month follow-up after echocardiogram and BNP results.  Patient lives near [place].  He will follow-up in 3 months with [facility] cardiology.   Medication Adjustments/Labs and Tests Ordered: Current medicines are reviewed at length with the patient today.  Concerns regarding medicines are outlined above.    Disposition:  Follow up 3 [facility] cardiology  Signed Neeko Pharo Arleta Maywood, MD, 06/29/2024 10:08 AM    [facility] at [facility] [address] [place], [place], [place] [address]

## 2024-06-30 LAB — BRAIN NATRIURETIC PEPTIDE: BNP: 33.7 pg/mL (ref 0.0–100.0)

## 2024-07-02 ENCOUNTER — Telehealth: Payer: Self-pay | Admitting: Internal Medicine

## 2024-07-02 ENCOUNTER — Other Ambulatory Visit (HOSPITAL_BASED_OUTPATIENT_CLINIC_OR_DEPARTMENT_OTHER): Payer: Self-pay

## 2024-07-02 ENCOUNTER — Encounter (HOSPITAL_BASED_OUTPATIENT_CLINIC_OR_DEPARTMENT_OTHER): Payer: Self-pay

## 2024-07-02 ENCOUNTER — Ambulatory Visit (HOSPITAL_BASED_OUTPATIENT_CLINIC_OR_DEPARTMENT_OTHER)
Admission: EM | Admit: 2024-07-02 | Discharge: 2024-07-02 | Disposition: A | Discharge status: Home / Self Care | Attending: Family Medicine | Admitting: Family Medicine

## 2024-07-02 ENCOUNTER — Ambulatory Visit (HOSPITAL_BASED_OUTPATIENT_CLINIC_OR_DEPARTMENT_OTHER): Payer: Self-pay | Admitting: Family Medicine

## 2024-07-02 ENCOUNTER — Ambulatory Visit (HOSPITAL_BASED_OUTPATIENT_CLINIC_OR_DEPARTMENT_OTHER): Admit: 2024-07-02 | Discharge: 2024-07-02 | Disposition: A | Discharge status: Home / Self Care | Admitting: Radiology

## 2024-07-02 DIAGNOSIS — R0789 Other chest pain: Secondary | ICD-10-CM | POA: Diagnosis not present

## 2024-07-02 DIAGNOSIS — R051 Acute cough: Secondary | ICD-10-CM

## 2024-07-02 DIAGNOSIS — R058 Other specified cough: Secondary | ICD-10-CM | POA: Diagnosis not present

## 2024-07-02 DIAGNOSIS — H6121 Impacted cerumen, right ear: Secondary | ICD-10-CM | POA: Diagnosis not present

## 2024-07-02 DIAGNOSIS — J842 Lymphoid interstitial pneumonia: Secondary | ICD-10-CM

## 2024-07-02 MED ORDER — IPRATROPIUM-ALBUTEROL 0.5-2.5 (3) MG/3ML IN SOLN
3.0000 mL | Freq: Four times a day (QID) | RESPIRATORY_TRACT | 0 refills | Status: DC | PRN
Start: 2024-07-02 — End: 2024-09-20
  Filled 2024-07-02: qty 360, 30d supply, fill #0

## 2024-07-02 MED ORDER — BREZTRI AEROSPHERE 160-9-4.8 MCG/ACT IN AERO
2.0000 | INHALATION_SPRAY | Freq: Two times a day (BID) | RESPIRATORY_TRACT | 0 refills | Status: DC
  Filled 2024-07-02: qty 10.7, 30d supply, fill #0

## 2024-07-02 MED ORDER — IPRATROPIUM-ALBUTEROL 0.5-2.5 (3) MG/3ML IN SOLN
3.0000 mL | Freq: Once | RESPIRATORY_TRACT | Status: AC
  Administered 2024-07-02 (×2): 3 mL via RESPIRATORY_TRACT

## 2024-07-02 NOTE — ED Triage Notes (Signed)
 Pt c/o cough-productive, nasal congestion, chest tightness/pressure- mostly on the right side radiating to the middle, body aches, chills, and facial pain. Pt has seen his immune and allergist dr and was prescribed amoxicillin  bid and gave him an injection- pt is unsure what it was. He is typically takes a z pac on Monday, Wednesday, and Friday due to his autoimmune lung disorders but has been holding it since being prescribed amoxicillin . He has also taken cough meds with some relief.

## 2024-07-02 NOTE — ED Provider Notes (Signed)
 [facility]    CSN: [id number] Arrival date & time: 07/02/24  1212      History   Chief Complaint Chief Complaint  Patient presents with   Cough    HPI Michael Bernard is a 68 y.o. male.   68 year old male with chronic immune deficiency and lung diseases.  He has specific antibody deficiency (SAD) that is managed by Dr. Lorin (Asthma and Allergy ).  He also has lymphoid interstitial pneumonitis which Dr. Annella (pulmonology) is managing.  He started with a cough that was productive with white-yellow sputum, nasal congestion, sinus pressure, chest pressure and tightness (on the right side more than the left), body aches chills.  Symptoms started on 06/22/24.  He was seen on 06/28/2024 by asthma and allergy .  He is on azithromycin  250 mg every M/W/F.  On 06/28/2024 he got Depo-Medrol  40 mg injection in the office at the allergist.  He was started on Augmentin  875-125 mg twice daily for 14 days.  He feels worse not better and contacted the allergist this morning and was encouraged to go to the urgent care to get a chest x-ray to be evaluated for possible pneumonia.   Cough Associated symptoms: rhinorrhea, shortness of breath and wheezing   Associated symptoms: no chest pain, no chills, no ear pain, no fever, no rash and no sore throat     Past Medical History:  Diagnosis Date   Anxiety    situational anxiety   Arthritis    LEFT knee   Asthma    Cancer (HCC), Melanoma    skin x 3-4 times   GERD (gastroesophageal reflux disease)    on meds   Hiatal hernia    Kidney stones    Multiple gastric ulcers 2015   seen on endoscopy per Dr. Charlanne in [place]    Seasonal allergies    Sleep apnea    sees Dr. Quita Salt, uses CPAP     Patient Active Problem List   Diagnosis Date Noted   Cardiac enlargement 06/29/2024   LIP (lymphoid interstitial pneumonitis) (HCC) 03/21/2024   Bell's palsy 02/28/2024   Postherpetic neuralgia 02/28/2024   Secondary polycythemia  02/15/2024   ILD (interstitial lung disease) (HCC) 04/05/2023   Umbilical hernia 12/02/2022   Burning mouth syndrome 12/02/2022   B12 deficiency 11/30/2021   Chronic fatigue 11/30/2021   Vertigo 11/30/2021   COVID-19 virus infection 08/24/2021   Cardiac chest pain    Progressive angina (HCC) 05/21/2021   Unstable angina (HCC)    Seasonal and perennial allergic rhinitis 06/10/2018   Obstructive sleep apnea 11/07/2017   Insomnia 11/07/2017   Dyspnea on exertion 11/07/2017   Multiple gastric ulcers 08/24/2017   Hx of malignant melanoma of skin 08/24/2017   GERD 09/18/2007   NEPHROLITHIASIS, HX OF 09/18/2007    Past Surgical History:  Procedure Laterality Date   COLONOSCOPY  07/20/2022   per Dr. Lynnie Charlanne, adenomatous polyps, repeat in 3 yrs   HERNIA REPAIR Bilateral    inguinal hernias repaired with mesh   KNEE SURGERY Left    arthritis and meniscal tear   LEFT HEART CATH AND CORONARY ANGIOGRAPHY N/A 05/22/2021   Procedure: LEFT HEART CATH AND CORONARY ANGIOGRAPHY;  Surgeon: Verlin Lonni BIRCH, MD;  Location: [facility];  Service: Cardiovascular;  Laterality: N/A;   MELANOMA EXCISION     per Dr. Trudy in [place], 3 removed from both shoulders and left temple    POLYPECTOMY  2017   TA  Home Medications    Prior to Admission medications   Medication Sig Start Date End Date Taking? Authorizing Provider  budesonide -glycopyrrolate -formoterol  (BREZTRI  AEROSPHERE) 160-9-4.8 MCG/ACT AERO inhaler Inhale 2 puffs into the lungs in the morning and at bedtime. 07/02/24  Yes Ival Domino, FNP  ipratropium-albuterol  (DUONEB) 0.5-2.5 (3) MG/3ML SOLN Take 3 mLs by nebulization every 6 (six) hours as needed (wheezing). 07/02/24  Yes Ival Domino, FNP  albuterol  (VENTOLIN  HFA) 108 (90 Base) MCG/ACT inhaler Inhale into the lungs every 6 (six) hours as needed for wheezing or shortness of breath.    [provider]  amoxicillin -clavulanate (AUGMENTIN ) 875-125  MG tablet Take 1 tablet by mouth 2 (two) times daily for 14 days. 06/28/24 07/12/24  Lorin Norris, MD  Azelastine -Fluticasone  (DYMISTA ) 137-50 MCG/ACT SUSP Place into the nose.    [provider]  azithromycin  (ZITHROMAX ) 250 MG tablet Take 250 mg every Monday Wednesday Friday for infection prevention for immunodeficiency 03/29/24   Lorin Norris, MD  clobetasol  ointment (TEMOVATE ) 0.05 % Apply topically twice daily to BODY as needed for SEVERE red, sandpaper and thickened like rash.  Do not use on face, groin or armpits.  Use for up to two weeks at a time. 03/27/24   Lorin Norris, MD  omeprazole  (PRILOSEC) 40 MG capsule TAKE 1 CAPSULE (40 MG TOTAL) BY MOUTH DAILY. 10/06/23   Johnny Garnette LABOR, MD  pregabalin  (LYRICA ) 300 MG capsule TAKE 1 CAPSULE BY MOUTH EVERY DAY 04/27/24   Johnny Garnette LABOR, MD  zolpidem  (AMBIEN ) 10 MG tablet TAKE 1 TABLET BY MOUTH AT BEDTIME FOR SLEEP IF NEEDED 04/26/24   Neysa Reggy BIRCH, MD    Family History Family History  Problem Relation Age of Onset   Colon polyps Mother    Arthritis Mother    Cancer Mother    Hyperlipidemia Mother    Hypertension Mother    Leukemia Mother    Atrial fibrillation Father    Heart attack Father    Heart disease Father    Hypertension Father    Hypertension Sister    Seizures Sister    Colon cancer Neg Hx    Esophageal cancer Neg Hx    Stomach cancer Neg Hx    Rectal cancer Neg Hx     Social History Social History   Tobacco Use   Smoking status: Former    Current packs/day: 0.00    Types: Cigarettes    Start date: 06/10/1976    Quit date: 06/11/1979    Years since quitting: 45.0   Smokeless tobacco: Never   Tobacco comments:    pt reports smoker 0.5 pack/month as a teenager 06/11/2019  Vaping Use   Vaping status: Never Used  Substance Use Topics   Alcohol use: Yes    Alcohol/week: 1.0 - 2.0 standard drink of alcohol    Types: 1 - 2 Cans of beer per week    Comment: 1-2 beers per month   Drug use: Never      Allergies   Trazodone  and nefazodone   Review of Systems Review of Systems  Constitutional:  Positive for fatigue. Negative for chills and fever.  HENT:  Positive for congestion, postnasal drip, rhinorrhea, sinus pressure and sinus pain. Negative for ear pain and sore throat.   Eyes:  Negative for pain and visual disturbance.  Respiratory:  Positive for cough, chest tightness, shortness of breath and wheezing.   Cardiovascular:  Negative for chest pain and palpitations.  Gastrointestinal:  Negative for abdominal pain, constipation, diarrhea, nausea  and vomiting.  Genitourinary:  Negative for dysuria and hematuria.  Musculoskeletal:  Positive for arthralgias. Negative for back pain.  Skin:  Negative for color change and rash.  Neurological:  Negative for seizures and syncope.  All other systems reviewed and are negative.    Physical Exam Triage Vital Signs ED Triage Vitals  Encounter Vitals Group     BP 07/02/24 1235 130/80     Girls Systolic BP Percentile --      Girls Diastolic BP Percentile --      Boys Systolic BP Percentile --      Boys Diastolic BP Percentile --      Pulse Rate 07/02/24 1235 69     Resp 07/02/24 1235 20     Temp 07/02/24 1235 98 F (36.7 C)     Temp Source 07/02/24 1235 Oral     SpO2 07/02/24 1235 96 %     Weight --      Height --      Head Circumference --      Peak Flow --      Pain Score 07/02/24 1233 3     Pain Loc --      Pain Education --      Exclude from Growth Chart --    No data found.  Updated Vital Signs BP 130/80 (BP Location: Right Arm)   Pulse 69   Temp 98 F (36.7 C) (Oral)   Resp 20   SpO2 96%   Visual Acuity Right Eye Distance:   Left Eye Distance:   Bilateral Distance:    Right Eye Near:   Left Eye Near:    Bilateral Near:     Physical Exam Vitals and nursing note reviewed.  Constitutional:      General: He is not in acute distress.    Appearance: He is well-developed. He is not ill-appearing or  toxic-appearing.  HENT:     Head: Normocephalic and atraumatic.     Right Ear: Hearing and external ear normal. There is impacted cerumen (Canal is completely obstructed with cerumen.  TM is not visible.).     Left Ear: Hearing, tympanic membrane, ear canal and external ear normal.     Nose: Congestion and rhinorrhea present. Rhinorrhea is clear.     Right Sinus: Frontal sinus tenderness present. No maxillary sinus tenderness.     Left Sinus: Frontal sinus tenderness present. No maxillary sinus tenderness.     Mouth/Throat:     Lips: Pink.     Mouth: Mucous membranes are moist.     Pharynx: Uvula midline. No oropharyngeal exudate or posterior oropharyngeal erythema.     Tonsils: No tonsillar exudate.  Eyes:     Conjunctiva/sclera: Conjunctivae normal.     Pupils: Pupils are equal, round, and reactive to light.  Cardiovascular:     Rate and Rhythm: Normal rate and regular rhythm.     Heart sounds: S1 normal and S2 normal. No murmur heard. Pulmonary:     Effort: Pulmonary effort is normal. No respiratory distress.     Breath sounds: Examination of the right-upper field reveals wheezing and rhonchi. Examination of the left-upper field reveals wheezing and rhonchi. Examination of the right-middle field reveals wheezing. Examination of the left-middle field reveals wheezing. Examination of the right-lower field reveals wheezing. Examination of the left-lower field reveals wheezing. Wheezing and rhonchi present. No decreased breath sounds or rales.     Comments: Reassessment after DuoNeb treatment: He continues with some wheezing but it is  a significant improvement in breath sounds and oxygen saturation is up to 97% on room air. Abdominal:     General: Bowel sounds are normal.     Palpations: Abdomen is soft.     Tenderness: There is no abdominal tenderness.  Musculoskeletal:        General: No swelling.     Cervical back: Neck supple.  Lymphadenopathy:     Head:     Right side of head: No  submental, submandibular, tonsillar, preauricular or posterior auricular adenopathy.     Left side of head: No submental, submandibular, tonsillar, preauricular or posterior auricular adenopathy.     Cervical: No cervical adenopathy.     Right cervical: No superficial cervical adenopathy.    Left cervical: No superficial cervical adenopathy.  Skin:    General: Skin is warm and dry.     Capillary Refill: Capillary refill takes less than 2 seconds.     Findings: No rash.  Neurological:     Mental Status: He is alert and oriented to person, place, and time.  Psychiatric:        Mood and Affect: Mood normal.      UC Treatments / Results  Labs (all labs ordered are listed, but only abnormal results are displayed) Labs Reviewed - No data to display  EKG   Radiology DG Chest 2 View Result Date: 07/02/2024 CLINICAL DATA:  Productive cough, chest tightness EXAM: CHEST - 2 VIEW COMPARISON:  05/21/2021 FINDINGS: The heart size and mediastinal contours are within normal limits. Both lungs are clear. The visualized skeletal structures are unremarkable. IMPRESSION: No active cardiopulmonary disease. Electronically Signed   By: Franky Crease M.D.   On: 07/02/2024 14:10    Procedures Procedures (including critical care time)  Medications Ordered in UC Medications  ipratropium-albuterol  (DUONEB) 0.5-2.5 (3) MG/3ML nebulizer solution 3 mL (3 mLs Nebulization Given 07/02/24 1354)    Initial Impression / Assessment and Plan / UC Course  I have reviewed the triage vital signs and the nursing notes.  Pertinent labs & imaging results that were available during my care of the patient were reviewed by me and considered in my medical decision making (see chart for details).  Plan of Care: Chronic lymphoid interstitial pneumonitis with cough: Chest x-ray showed some hazy areas but no consolidation.  Will update the patient once radiology has reviewed the film, if there is a differing report and we  need to change the plan of care.  Continue azithromycin  3 times a week and Augmentin  twice daily for 14 days.  Refilled Breztri  inhaler and use 2 puffs twice daily.  Patient had Depo-Medrol  40 mg injection on 06/28/2024 so no additional steroids added.  Significant improvement in breath sounds with DuoNeb treatment.  Provided a nebulizer machine for home use with DuoNeb solution for the nebulizer.  Patient has a large cerumen impaction on the right.  Provided information on how to soften the earwax and return here for cleaning if needed.  Follow-up with asthma/allergist and pulmonologist as needed.  Return here if needed.  I reviewed the plan of care with the patient and/or the patient's guardian.  The patient and/or guardian had time to ask questions and acknowledged that the questions were answered.  I provided instruction on symptoms or reasons to return here or to go to an ER, if symptoms/condition did not improve, worsened or if new symptoms occurred.  Final Clinical Impressions(s) / UC Diagnoses   Final diagnoses:  Acute cough  Impacted cerumen of  right ear  Chronic lymphoid interstitial pneumonitis [facility])     Discharge Instructions      Chronic lymphoid interstitial pneumonitis with cough: Chest x-ray shows some hazy areas but no consolidation.  Chest x-ray is most consistent with bronchitis not pneumonia.  Continue azithromycin  and Augmentin , as provided by Dr. Lorin.  Continue Breztri  inhaler, as provided by Dr. Annella.  Patient had Depo-Medrol  40 mg on 06/28/2024 as an injection at Dr. Carita office.  Promethazine DM, 5 mL, every 6 hours if needed for cough.  Plenty of fluids and rest.  Follow-up with specialist as needed.  Right ear cerumen impaction: See instructions below.  Soften up wax and return here or to primary care for ear lavage when ready.  Follow-up here if needed.  There is hard wax in your ear(s).  It should be softened before someone tries to rinse out the  ears.  Use Colace gelcaps (an over-the-counter stool softener).  Use a large needle or a safety pin to poke a hole in 1 part of the gelcap.  Squeeze 1 or 2 gelcaps into an ear canal and then put a cottonball in the ear.  Do this to both ears if both ears have wax.  Do this at night prior to sleep (for 2-4 nights) and just prior to a planned visit for ear cleaning.  This will soften the wax so that it is easy to clean your ears out.      ED Prescriptions     Medication Sig Dispense Auth. Provider   budesonide -glycopyrrolate -formoterol  (BREZTRI  AEROSPHERE) 160-9-4.8 MCG/ACT AERO inhaler Inhale 2 puffs into the lungs in the morning and at bedtime. 10.7 g Ival Domino, FNP   ipratropium-albuterol  (DUONEB) 0.5-2.5 (3) MG/3ML SOLN Take 3 mLs by nebulization every 6 (six) hours as needed (wheezing). 360 mL Ival Domino, FNP      PDMP not reviewed this encounter.   Ival Domino, FNP 07/02/24 1414

## 2024-07-02 NOTE — Telephone Encounter (Signed)
 Michael Bernard called and stated that he is still having the same issues, and has gotten worse over the weekend. He states he would like a call back from a nurse to discuss.

## 2024-07-02 NOTE — Progress Notes (Signed)
 Chest x-ray is negative.  No sign of acute pneumonia.  Patient was updated on these results during the visit.  Follow-up as needed.

## 2024-07-02 NOTE — Discharge Instructions (Signed)
 Chronic lymphoid interstitial pneumonitis with cough: Chest x-ray shows some hazy areas but no consolidation.  Chest x-ray is most consistent with bronchitis not pneumonia.  Continue azithromycin  and Augmentin , as provided by Dr. Lorin.  Continue Breztri  inhaler, as provided by Dr. Annella.  Patient had Depo-Medrol  40 mg on 06/28/2024 as an injection at Dr. Carita office.  Promethazine DM, 5 mL, every 6 hours if needed for cough.  Plenty of fluids and rest.  Follow-up with specialist as needed.  Right ear cerumen impaction: See instructions below.  Soften up wax and return here or to primary care for ear lavage when ready.  Follow-up here if needed.  There is hard wax in your ear(s).  It should be softened before someone tries to rinse out the ears.  Use Colace gelcaps (an over-the-counter stool softener).  Use a large needle or a safety pin to poke a hole in 1 part of the gelcap.  Squeeze 1 or 2 gelcaps into an ear canal and then put a cottonball in the ear.  Do this to both ears if both ears have wax.  Do this at night prior to sleep (for 2-4 nights) and just prior to a planned visit for ear cleaning.  This will soften the wax so that it is easy to clean your ears out.

## 2024-07-02 NOTE — Telephone Encounter (Signed)
 Patient called back at noon per Dr Lorin advised to seek UC or ED care his symptoms have escalated he needs further workup. Chest xray, swab for FLU, COVID, RSV more than what we can provide in clinic, especially if antibiotics are not helping and if he was febrile over the weekend. Patient understands no other questions or concerns

## 2024-07-02 NOTE — Telephone Encounter (Signed)
 Patient called in stating he received a Depo injection on last Thursday.  Later started running fever,  cough and congestion, shortness of breath, and wheezing.  Patient has been advise to escalate care to an Urgent care, or ED, per DO.

## 2024-07-02 NOTE — ED Notes (Signed)
 Provided patient with nebulizer machine for home use. Instruction given on assembly and care of tubing. Understanding verbalized.

## 2024-07-05 ENCOUNTER — Encounter

## 2024-07-06 ENCOUNTER — Ambulatory Visit: Payer: Self-pay | Admitting: Internal Medicine

## 2024-07-09 ENCOUNTER — Ambulatory Visit
Admission: RE | Admit: 2024-07-09 | Discharge: 2024-07-09 | Disposition: A | Discharge status: Home / Self Care | Source: Ambulatory Visit | Attending: Diagnostic Neuroimaging | Admitting: Diagnostic Neuroimaging

## 2024-07-09 DIAGNOSIS — G510 Bell's palsy: Secondary | ICD-10-CM

## 2024-07-09 DIAGNOSIS — Z135 Encounter for screening for eye and ear disorders: Secondary | ICD-10-CM | POA: Diagnosis not present

## 2024-07-09 MED ORDER — GADOPICLENOL 0.5 MMOL/ML IV SOLN
10.0000 mL | Freq: Once | INTRAVENOUS | Status: AC | PRN
  Administered 2024-07-09: 10 mL via INTRAVENOUS

## 2024-07-11 ENCOUNTER — Ambulatory Visit: Payer: Self-pay | Admitting: Neurology

## 2024-07-11 NOTE — Telephone Encounter (Signed)
-----   Message from True Mar sent at 07/11/2024  8:54 AM EDT ----- No acute findings on brain MRI, which shows signs in keeping with recent Bell's palsy on the R. Also, evidence of chr sinusitis on the L, in the maxillary sinus. If he has Sx of congestion, drainage, facial pressure, I recommend he discuss sinusitis treatment with PCP, maybe even a referral to  ENT if recommended by PCP.  Pls call pt. ----- Message ----- From: Delfino Augustin BROCKS, RN Sent: 07/11/2024   8:08 AM EDT To: True Mar, MD  Please review and result to for Dr Margaret ----- Message ----- From: Vear Charlie LABOR, MD Sent: 07/09/2024   8:44 PM EDT To: Eduard JONELLE Margaret, MD

## 2024-07-11 NOTE — Telephone Encounter (Signed)
 Called the patient and reviewed the MRI brain. Advised the pt that MRI overall showed the findings of the recent bells palsy. Advised of the sinusitis that was found. Pt is aware of this and already following with appropriate MD's. Pt would like to make sure that when Dr Margaret returns he takes a look as well at the MRI due to the different circumstances of his health and what he has went through with burning mouth syndrome. Instructed I would call back if Dr Margaret has further information or recommendations. Pt verbalized understanding.

## 2024-07-13 ENCOUNTER — Encounter: Payer: Self-pay | Admitting: Internal Medicine

## 2024-07-16 NOTE — Telephone Encounter (Signed)
 Nilan is now open to starting immunoglobulin treatment for specific antibody deficiency given he is having recurrent infections on prophylactic antibiotics. He is open to IVIG or SCIG but hoping for the cheapest option.  Thanks!

## 2024-07-17 NOTE — Telephone Encounter (Signed)
 Spoke to patient and advised will submit him to [employer] for [facility] he already was approved for since he wants to do SCIG instead of IVIG at this time. Will be in touch regarding process

## 2024-07-20 ENCOUNTER — Ambulatory Visit: Attending: Internal Medicine

## 2024-07-20 DIAGNOSIS — I517 Cardiomegaly: Secondary | ICD-10-CM | POA: Diagnosis not present

## 2024-07-20 LAB — ECHOCARDIOGRAM COMPLETE
Area-P 1/2: 3.51 cm2
S' Lateral: 3.3 cm

## 2024-07-24 DIAGNOSIS — L301 Dyshidrosis [pompholyx]: Secondary | ICD-10-CM | POA: Diagnosis not present

## 2024-08-02 ENCOUNTER — Telehealth: Payer: Self-pay

## 2024-08-02 NOTE — Telephone Encounter (Signed)
 Copied from CRM #[id number]. Topic: Clinical - Medication Question >> Aug 02, 2024 11:25 AM Isabell A wrote: Reason for CRM: Patient states he was given Breztri  samples, he is requesting an actual prescription.   [id number] [place], [place] - [address], [place] [address] Phone: [phone]  Fax: [phone]   Callback number: [phone]  I called and spoke to pt. Pt states the Breztri  does seem to be working for him and he would like a RX called in for him. I do not see a note in the lov stating if we could send a RX in, so I will route this to Dr Annella to advise if it is okay to place a RX of Breztri  to the confirmed pharmacy stated above.

## 2024-08-03 MED ORDER — BREZTRI AEROSPHERE 160-9-4.8 MCG/ACT IN AERO: 2.0000 | INHALATION_SPRAY | Freq: Two times a day (BID) | RESPIRATORY_TRACT | 11 refills | Status: AC

## 2024-08-03 NOTE — Telephone Encounter (Signed)
Patient notified.NFN

## 2024-08-03 NOTE — Telephone Encounter (Signed)
 Breztri  sent - thanks!

## 2024-08-06 ENCOUNTER — Ambulatory Visit: Admitting: Pulmonary Disease

## 2024-08-06 DIAGNOSIS — J842 Lymphoid interstitial pneumonia: Secondary | ICD-10-CM

## 2024-08-06 LAB — PULMONARY FUNCTION TEST
DL/VA % pred: 102 %
DL/VA: 4.25 ml/min/mmHg/L
DLCO unc % pred: 81 %
DLCO unc: 20.48 ml/min/mmHg
FEF 25-75 Post: 2.36 L/s
FEF 25-75 Pre: 1.45 L/s
FEF2575-%Change-Post: 62 %
FEF2575-%Pred-Post: 94 %
FEF2575-%Pred-Pre: 58 %
FEV1-%Change-Post: 22 %
FEV1-%Pred-Post: 81 %
FEV1-%Pred-Pre: 66 %
FEV1-Post: 2.55 L
FEV1-Pre: 2.09 L
FEV1FVC-%Change-Post: 23 %
FEV1FVC-%Pred-Pre: 85 %
FEV6-%Change-Post: 0 %
FEV6-%Pred-Post: 80 %
FEV6-%Pred-Pre: 80 %
FEV6-Post: 3.24 L
FEV6-Pre: 3.24 L
FEV6FVC-%Change-Post: 0 %
FEV6FVC-%Pred-Post: 105 %
FEV6FVC-%Pred-Pre: 104 %
FVC-%Change-Post: 0 %
FVC-%Pred-Post: 76 %
FVC-%Pred-Pre: 77 %
FVC-Post: 3.25 L
FVC-Pre: 3.27 L
Post FEV1/FVC ratio: 79 %
Post FEV6/FVC ratio: 100 %
Pre FEV1/FVC ratio: 64 %
Pre FEV6/FVC Ratio: 99 %
RV % pred: 107 %
RV: 2.43 L
TLC % pred: 85 %
TLC: 5.67 L

## 2024-08-06 NOTE — Patient Instructions (Signed)
 Full pft performed today

## 2024-08-06 NOTE — Progress Notes (Signed)
 Full pft performed today

## 2024-08-08 ENCOUNTER — Encounter: Payer: Self-pay | Admitting: Pulmonary Disease

## 2024-08-08 NOTE — Telephone Encounter (Signed)
 Please advise,patient had pft completed on 9/15

## 2024-08-13 ENCOUNTER — Encounter: Payer: Self-pay | Admitting: Oncology

## 2024-08-15 ENCOUNTER — Ambulatory Visit: Payer: Self-pay | Admitting: General Surgery

## 2024-08-15 ENCOUNTER — Encounter: Payer: Self-pay | Admitting: Family Medicine

## 2024-08-15 DIAGNOSIS — K429 Umbilical hernia without obstruction or gangrene: Secondary | ICD-10-CM | POA: Diagnosis not present

## 2024-08-15 MED ORDER — SCOPOLAMINE 1 MG/3DAYS TD PT72: 1.0000 | MEDICATED_PATCH | TRANSDERMAL | 2 refills | Status: DC

## 2024-08-15 NOTE — Telephone Encounter (Signed)
I sent in the patches  °

## 2024-08-16 ENCOUNTER — Inpatient Hospital Stay

## 2024-08-16 ENCOUNTER — Other Ambulatory Visit: Payer: Self-pay | Admitting: Oncology

## 2024-08-16 ENCOUNTER — Inpatient Hospital Stay: Attending: Oncology

## 2024-08-16 DIAGNOSIS — D751 Secondary polycythemia: Secondary | ICD-10-CM | POA: Diagnosis not present

## 2024-08-16 LAB — CBC WITH DIFFERENTIAL (CANCER CENTER ONLY)
Abs Immature Granulocytes: 0.02 K/uL (ref 0.00–0.07)
Basophils Absolute: 0.1 K/uL (ref 0.0–0.1)
Basophils Relative: 2 %
Eosinophils Absolute: 0.4 K/uL (ref 0.0–0.5)
Eosinophils Relative: 6 %
HCT: 47.5 % (ref 39.0–52.0)
Hemoglobin: 16.2 g/dL (ref 13.0–17.0)
Immature Granulocytes: 0 %
Lymphocytes Relative: 26 %
Lymphs Abs: 1.6 K/uL (ref 0.7–4.0)
MCH: 31.1 pg (ref 26.0–34.0)
MCHC: 34.1 g/dL (ref 30.0–36.0)
MCV: 91.2 fL (ref 80.0–100.0)
Monocytes Absolute: 0.7 K/uL (ref 0.1–1.0)
Monocytes Relative: 11 %
Neutro Abs: 3.5 K/uL (ref 1.7–7.7)
Neutrophils Relative %: 55 %
Platelet Count: 294 K/uL (ref 150–400)
RBC: 5.21 MIL/uL (ref 4.22–5.81)
RDW: 13 % (ref 11.5–15.5)
WBC Count: 6.4 K/uL (ref 4.0–10.5)
nRBC: 0 % (ref 0.0–0.2)

## 2024-08-16 NOTE — Progress Notes (Signed)
 Hct 47.5. Therapeutic phlebotomy not needed per orders. Pt notified.

## 2024-08-21 DIAGNOSIS — L301 Dyshidrosis [pompholyx]: Secondary | ICD-10-CM | POA: Diagnosis not present

## 2024-08-27 ENCOUNTER — Encounter: Payer: Self-pay | Admitting: Internal Medicine

## 2024-08-27 DIAGNOSIS — J3089 Other allergic rhinitis: Secondary | ICD-10-CM | POA: Diagnosis not present

## 2024-08-27 DIAGNOSIS — J3081 Allergic rhinitis due to animal (cat) (dog) hair and dander: Secondary | ICD-10-CM | POA: Diagnosis not present

## 2024-08-27 DIAGNOSIS — J0181 Other acute recurrent sinusitis: Secondary | ICD-10-CM | POA: Diagnosis not present

## 2024-08-27 DIAGNOSIS — D806 Antibody deficiency with near-normal immunoglobulins or with hyperimmunoglobulinemia: Secondary | ICD-10-CM | POA: Diagnosis not present

## 2024-08-28 NOTE — Telephone Encounter (Signed)
 Stay on the azithromycin  for now until we reach steady state with infusions.  Anticipate 3 months.

## 2024-08-29 ENCOUNTER — Telehealth: Payer: Self-pay | Admitting: Pulmonary Disease

## 2024-08-29 NOTE — Telephone Encounter (Signed)
 Called patient to reschedule an appointment and patient asked if it was possible to see Hunsucker before his surgery on 11/11? I let the patient know that Dr.Hunsucker was booked up until 11/11 and that I would ask clinical staff if there was anyway to get patient in before his surgery. Patient stated that he just wants to get checked out and make sure he is all good before the surgery. Please advise and give patient a call back, thanks.   I did also schedule an appointment for the patient after his surgery w/ Hunsucker on 11/18 in case he wasn't able to get in before.

## 2024-09-03 ENCOUNTER — Encounter: Payer: Self-pay | Admitting: Internal Medicine

## 2024-09-03 DIAGNOSIS — J3089 Other allergic rhinitis: Secondary | ICD-10-CM | POA: Diagnosis not present

## 2024-09-03 DIAGNOSIS — J3081 Allergic rhinitis due to animal (cat) (dog) hair and dander: Secondary | ICD-10-CM | POA: Diagnosis not present

## 2024-09-03 DIAGNOSIS — D806 Antibody deficiency with near-normal immunoglobulins or with hyperimmunoglobulinemia: Secondary | ICD-10-CM | POA: Diagnosis not present

## 2024-09-03 DIAGNOSIS — J0181 Other acute recurrent sinusitis: Secondary | ICD-10-CM | POA: Diagnosis not present

## 2024-09-03 NOTE — Telephone Encounter (Signed)
 Called patient,has been scheduled for the 30th

## 2024-09-14 ENCOUNTER — Other Ambulatory Visit: Payer: Self-pay | Admitting: Family Medicine

## 2024-09-17 ENCOUNTER — Ambulatory Visit: Admitting: Internal Medicine

## 2024-09-17 VITALS — BP 116/72 | HR 60 | Temp 98.2°F

## 2024-09-17 DIAGNOSIS — K146 Glossodynia: Secondary | ICD-10-CM | POA: Diagnosis not present

## 2024-09-17 DIAGNOSIS — D806 Antibody deficiency with near-normal immunoglobulins or with hyperimmunoglobulinemia: Secondary | ICD-10-CM | POA: Diagnosis not present

## 2024-09-17 DIAGNOSIS — J3089 Other allergic rhinitis: Secondary | ICD-10-CM

## 2024-09-17 DIAGNOSIS — G510 Bell's palsy: Secondary | ICD-10-CM

## 2024-09-17 DIAGNOSIS — J842 Lymphoid interstitial pneumonia: Secondary | ICD-10-CM

## 2024-09-17 NOTE — Progress Notes (Signed)
 Follow Up Note  RE: Michael Bernard MRN: [id number] DOB: [birth year] Date of Office Visit: 09/17/2024  Referring provider: Johnny Garnette LABOR, MD Primary care provider: Johnny Garnette LABOR, MD  Chief Complaint: Follow-up (Follow office visit 06/28/24 for sinusitis, allergic rhinitis, and bells palsy. Patient states she is doing well today. )  History of Present Illness: I had the pleasure of seeing Michael Bernard for a follow up visit at the [facility] on 09/17/2024. He is a 68 y.o. male, who is being followed for SAD, persistent asthma, recurrent sinusitis, allergic rhinitis . His previous allergy  office visit was on 06/28/24 with Dr. Lorin. Today is a regular follow up visit.  History obtained from patient, chart review.   Since last visit:  Heme (05/17/24): On his consultation with us  on 02/15/2024, labs actually showed improved hemoglobin of 16.9, hematocrit 50.  White count and platelet count were within normal limits.  CMP, iron studies, LDH, erythropoietin  levels were all unremarkable. JAK2 mutation with reflex testing for CALR, MPL, exon 12-15 mutations was also negative.  His ILD and sleep apnea may contribute to his secondary polycythemia.   -no indication for  therapeutic phlebotomy currently, f/u in 3 months   Pulm (05/24/24): Chronic cough: Largely related to postnasal drip symptoms, he uses nasal sprays as needed based on severity of this symptom.  Possible contribution of asthma given shortness of breath and productive cough.  Worse with Arnuity, DPI in the past.  He did not complain of cough today.   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  Given his history of pneumonias and recurrent infections pneumatoceles are considered.  There are thin-walled cyst.  He has burning mouth.  Do wonder if this is a manifestation of underlying Sjogren's although review of literature indicates not a clear connection.  With  a thin-walled cysts, burning mouth, as well as report of her diagnosis of immunodeficiency per immunologist of specific antibody deficiency with normal immunoglobulin concentration and normal number of B cells, suspicion for LIP relatively high even though epidemiologically it is rare.  Other considerations include Langerhans but given lack of smoking history this felt less likely.  Adeline Mirza Dubay considered but given other history felt less likely, no characteristic skin manifestations as well.  Seen by [facility] in the interim in 2024, PFTs normal, imaging stable, ultimately agree with clinical diagnosis of LIP. Given stability disease, no further recommendations made.  Repeat CT scan 03/2024 stable.  Yet to have PFT scheduled, will obtain at time of next visit and review.   Dyspnea on exertion: Suspect related to mild additional lung disease with mild reduction in TLC, mild restriction in the past.  Enlarged heart on most recent CT scan.  Referral to cardiology sent in May.  This is yet to be scheduled.  Will send a message to schedule to find out what the delay is.  Trial of Breztri  via samples today.  Inhalers have not helped in the past per her report.   Neuro (06/27/24): Recurrent bells palsy + right tongue / mouth burning sensation - check MRI brain / IAC (with and without) to rule out other causes - continue pregabalin  300mg  at bedtime  Today he reports  Discussed the use of AI scribe software for clinical note transcription with the patient, who gave verbal consent to proceed.  History of Present Illness Kingsly Bernard is a 68 year old male with chronic sinusitis and recent Bell's palsy  who presents for follow-up on his immunotherapy treatment and upcoming surgery.  Immunotherapy-related symptoms and management - Undergoing SCIG, with three completed and preparing for a fourth - Experienced headaches after the first two injections in early January, now resolved - Redness in one eye  following injections, described as 'like getting an infection,' resolved spontaneously - Pre-treats with two Tylenol  and an antihistamine (such as Zyrtec) 30 minutes prior to infusion to mitigate side effects - Infusions last over two hours, attributed to emulsion fluid or tubing; third infusion also took over two hours with assistance from his wife - No illness since returning from a recent cruise, considered a positive outcome of treatment - Interested in stopping his azithromycin  now that he is on scig  - last ABX given in AUG 25 - In regards to SCIG he is switching to [employer] in [place] for better coverage as [facility] medicare is limiting options for providers and he would like to continue to see his established physicians.    Chronic sinusitis and upper respiratory symptoms - Still with mild nasal congestion on the right.  Continues take Zyrtec, Flonase , Singulair daily   Dyspnea/LIP - Managed by pulmonary, last PFTs were stable per report.  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 - Stay on current SCIG infusions weekly  - Okay to stop azithromycin  now that you are on SCIG - continue to pretreat infusions with tyelenol and consider switching benadryl to zyrtec (can take 2 tabs)    Recurrent bells palsy, fissured/burning tongue  - Continue work up per neurology  - Continue pregabalin  300mg  at bedtime   Dyspnea:  LIP Follow up with Pulmonary, defer further work up to pulmonary Will continue albuterol  as needed Singulair continued for allergic rhinitis  Breztri  2 puffs twice daily as prescribed by pulmonary    Mixed Allergic and nonallergic Rhinitis:   - Testing 08/25/22: postive to  [place]  blue grass pollen and cocklebur weed pollen, intradermals were negative  -Continue avoidance measures - Continue taking: Zyrtec 10mg  daily as needed, Flonase  1 spray per nostril as needed for stuffy nose  and Singulair (montelukast) 10mg  daily - You can use an extra dose of the antihistamine, if needed, for breakthrough symptoms.  - Consider nasal saline rinses 1-2 times daily to remove allergens from the nasal cavities as well as help with mucous clearance (this is especially helpful to do before the nasal sprays are given)   Follow up: 3 months   Thank you so much for letting me partake in your care today.  Don't hesitate to reach out if you have any  additional concerns!  Hargis Springer, MD  [facility] [place] [place]    Lab Orders  No laboratory test(s) ordered today    Diagnostics: None done    Medication List:  Current Outpatient Medications  Medication Sig Dispense Refill   Azelastine -Fluticasone  (DYMISTA ) 137-50 MCG/ACT SUSP Place into the nose.     azithromycin  (ZITHROMAX ) 250 MG tablet Take 250 mg every Monday Wednesday Friday for infection prevention for immunodeficiency 12 each 6   budesonide -glycopyrrolate -formoterol  (BREZTRI  AEROSPHERE) 160-9-4.8  MCG/ACT AERO inhaler Inhale 2 puffs into the lungs in the morning and at bedtime. 1 each 11   omeprazole  (PRILOSEC) 40 MG capsule TAKE 1 CAPSULE (40 MG TOTAL) BY MOUTH DAILY. 90 capsule 3   pregabalin  (LYRICA ) 300 MG capsule TAKE 1 CAPSULE BY MOUTH EVERY DAY 90 capsule 1   zolpidem  (AMBIEN ) 10 MG tablet TAKE 1 TABLET BY MOUTH AT BEDTIME FOR SLEEP IF NEEDED 90 tablet 1   albuterol  (VENTOLIN  HFA) 108 (90 Base) MCG/ACT inhaler Inhale into the lungs every 6 (six) hours as needed for wheezing or shortness of breath.     clobetasol  ointment (TEMOVATE ) 0.05 % Apply topically twice daily to BODY as needed for SEVERE red, sandpaper and thickened like rash.  Do not use on face, groin or armpits.  Use for up to two weeks at a time. 60 g 1   ipratropium-albuterol  (DUONEB) 0.5-2.5 (3) MG/3ML SOLN Take 3 mLs by nebulization every 6 (six) hours as needed (wheezing). 360 mL 0   scopolamine  (TRANSDERM-SCOP) 1 MG/3DAYS Place 1 patch (1 mg total) onto the skin every 3 (three) days. 10 patch 2   No current facility-administered medications for this visit.   Allergies: Allergies  Allergen Reactions   Trazodone  And Nefazodone Other (See Comments)    Feels hung over the next day    I reviewed his past medical history, social history, family history, and environmental history and no significant changes have been reported from his previous visit.  ROS: All others negative except as noted per HPI.   Objective: BP 116/72 (BP Location: Right Arm, Patient Position: Sitting, Cuff Size: Large)   Pulse 60   Temp 98.2 F (36.8 C)  There is no height or weight on file to calculate BMI. General Appearance:  Tired appearing, cooperative, no distress, appears  tired  Head:  Normocephalic, without obvious abnormality, atraumatic  Eyes:  Conjunctiva clear, EOM's intact  Nose: Nares normal, hypertrophic turbinates, normal mucosa, no visible anterior polyps, and septum midline  Throat: Lips, fissured tongue; teeth and gums  normal, + cobblestoning  Neck: Supple, symmetrical  Lungs:   clear to auscultation bilaterally, Respirations unlabored, no coughing  Heart:  regular rate and rhythm and no murmur, Appears well perfused  Extremities: No edema  Skin: Skin color, texture, turgor normal, no rashes or lesions on visualized portions of skin  Neurologic: No gross deficits   Previous notes and tests were reviewed. The plan was reviewed with the patient/family, and all questions/concerned were addressed.  It was my pleasure to see Michael Bernard today and participate in his care. Please feel free to contact me with any questions or concerns.  Sincerely,  Hargis Springer, MD  Allergy  & Immunology  [facility]  [place] Office: [phone]

## 2024-09-17 NOTE — Patient Instructions (Addendum)
 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 - Stay on current SCIG infusions weekly  - Okay to stop azithromycin  now that you are on SCIG - continue to pretreat infusions with tyelenol and consider switching benadryl to zyrtec (can take 2 tabs)    Recurrent bells palsy, fissured/burning tongue  - Continue work up per neurology  - Continue pregabalin  300mg  at bedtime   Dyspnea:  LIP Follow up with Pulmonary, defer further work up to pulmonary Will continue albuterol  as needed Singulair continued for allergic rhinitis  Breztri  2 puffs twice daily as prescribed by pulmonary    Mixed Allergic and nonallergic Rhinitis:   - Testing 08/25/22: postive to  [place]  blue grass pollen and cockle[place] weed pollen, intradermals were negative  -Continue avoidance measures - Continue taking: Zyrtec 10mg  daily as needed, Flonase  1 spray per nostril as needed for stuffy nose  and Singulair (montelukast) 10mg  daily - You can use an extra dose of the antihistamine, if needed, for breakthrough symptoms.  - Consider nasal saline rinses 1-2 times daily to remove allergens from the nasal cavities as well as help with mucous clearance (this is especially helpful to do before the nasal sprays are given)   Follow up: 3 months   Thank you so much for letting me partake in your care today.  Don't hesitate to reach out if you have any additional concerns!  Hargis Springer, MD  [facility] [place], [place]

## 2024-09-20 ENCOUNTER — Ambulatory Visit: Admitting: Pulmonary Disease

## 2024-09-20 ENCOUNTER — Encounter: Payer: Self-pay | Admitting: Pulmonary Disease

## 2024-09-20 VITALS — BP 116/74 | HR 86 | Ht 67.0 in | Wt 233.3 lb

## 2024-09-20 DIAGNOSIS — R0609 Other forms of dyspnea: Secondary | ICD-10-CM

## 2024-09-20 DIAGNOSIS — J842 Lymphoid interstitial pneumonia: Secondary | ICD-10-CM | POA: Diagnosis not present

## 2024-09-20 DIAGNOSIS — J849 Interstitial pulmonary disease, unspecified: Secondary | ICD-10-CM

## 2024-09-20 NOTE — Patient Instructions (Signed)
 Nice to see you again  No changes to medication  Your pulmonary function test are stable and actually in some ways improved from 2019.  CT scans over time have been stable.  This is all encouraging of the lungs are doing okay.  I sent a referral for pulmonary rehab to see if we can build up your stamina sick this helps her shortness of breath.  I see no reason to delay the surgery for the hernia, I think it is okay to get this done  Return to clinic in 6 months or sooner as needed with Dr. Annella

## 2024-09-20 NOTE — Progress Notes (Signed)
 @Patient  ID: Michael Bernard, male    DOB: 1956-09-01, 67 y.o.   MRN: [id number]  Chief Complaint  Patient presents with   Medical Management of Chronic Issues    Pt states here to see MD before a surgery     Referring provider: Johnny Garnette LABOR, MD  HPI:   68 y.o. man we are seeing for evaluation of shortness of breath, cough as well as cystic-like lesions on CT scan, most likely LIP.  Here today for preoperative evaluation.  Overall, doing fairly well.  Baseline dyspnea persist.  Gradually worse over the last several years.  We performed PFTs in the interim, full interpretation below, but these are normal and some mild values actually improved compared to prior, overall percentage predicted stable.  Serial CT scans reviewed, my interpretation reveals stable cystic lung lesions without other significant changes.  Discussed at length that his dyspnea is real but given normal PFTs and stable lung images, either is not related to the lung or our treatments are ineffective.  He continues on his inhaler.  Breztri .  Does not think it is very helpful.  We discussed pulmonary rehab ability and cardiovascular endurance.  Has upcoming planned abdominal hernia surgery.   HPI at initial visit: Patient describes onset of dyspnea on exertion starting around the time he retired.  Couple years ago.  Started prior to retirement.  Previously, during his work, he walked 10+ miles a day.  Likely even more.  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  But again, for the most of his career he did not have any shortness of breath.  Walked quite extensively, was very active.  Since retirement over the last couple years progressive dyspnea as described.  On exertion.  Worse on inclines or stairs in particular.  No time of day when things are better or worse.  No position make things better or worse.  No other relieving or exacerbating  factors.  Has tried albuterol  but does not think it is helped very much.  He does note he had several sinus infections, viral infections over the last few weeks to months which limits his ability to assess the effectiveness.  In addition, he describes a productive cough.  Present for years.  Possibly for a decade or more.  Productive of phlegm.  Worse in the mornings shortly after waking up.  He does endorse nasal congestion.  Denies much rhinorrhea or postnasal drip.  He reports a hiatal hernia.  History of heartburn.  Although does not think his symptoms are very bad..  Recently he was prescribed Arnuity for presumed asthma given productive cough and shortness of breath.  He is yet to start this.  It sounds like his PCP was concerned for possible stroke in 2021.  An outpatient CTA neck as well as CT head was ordered.  On the visible upper portion of the lung, apices and upper portion of bilateral upper lobes reveals scattered cystic findings within the walls.  With ongoing recent workup for shortness of breath by his allergist, a CT chest was ordered which shows scattered thin-walled cystic lesions throughout both in the periphery and central areas otherwise clear lungs on my review interpretation.  It appears he has a immunodeficiency diagnosed by his allergist.  The feeling is that may be the reason why he has so many recurrent sinus infections etc.  He does endorse history of pneumonia  as a child.  Several when he was a child.  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  He describes the symptoms in detail as well as improvement with pregabalin .  Seems worse throughout the day better in the mornings.  He wears CPAP for OSA and he thinks with his mouth  being close while he sleeps the sensation of burning is improved but as he talks and agrees to his mouth during the day the burning worsens.  He denies history of cigarette smoking.  Smokes cigars 1 or so a day for a couple years from time of 42 early 82s.  Has quit and no smoking since then.  PFTs in 2022 fully interpreted below but demonstrate normal versus mild reduction in TLC and DLCO.  He had serial spirometry in the interim which shows decreased FEV1 and FVC.  He had TTE 05/2021 that demonstrates normal valves as well as normal right-sided Cavities and normal estimated PASP.  He had a left heart cath 05/2021 that showed 20% RCA lesion, otherwise no significant lesions, nonobstructive CAD, LVEDP reported at 10.  Questionaires / Pulmonary Flowsheets:   ACT:  Asthma Control Test ACT Total Score  12/27/2022 11:00 AM 13    MMRC:     No data to display          Epworth:     11/07/2017    1:00 PM  Results of the Epworth flowsheet  Sitting and reading 3  Watching TV 3  Sitting, inactive in a public place (e.g. a theatre or a meeting) 3  As a passenger in a car for an hour without a break 0  Lying down to rest in the afternoon when circumstances permit 3  Sitting and talking to someone 0  Sitting quietly after a lunch without alcohol 0  In a car, while stopped for a few minutes in traffic 0  Total score 12    Tests:   FENO:  No results found for: NITRICOXIDE  PFT:    Latest Ref Rng & Units 08/06/2024   10:31 AM 02/07/2018    9:59 AM  PFT Results  FVC-Pre L 3.27  3.53   FVC-Predicted Pre % 77  79   FVC-Post L 3.25  3.49   FVC-Predicted Post % 76  78   Pre FEV1/FVC % % 64  83   Post FEV1/FCV % % 79  83   FEV1-Pre L 2.09  2.92   FEV1-Predicted Pre % 66  87   FEV1-Post L 2.55  2.90   DLCO uncorrected ml/min/mmHg 20.48  23.00   DLCO UNC% % 81  77   DLVA Predicted % 102  100   TLC L 5.67  5.09   TLC % Predicted % 85  77   RV % Predicted % 107  75   Prior PFT 2019  personally reviewed and interpreted as spirometry suggestive of moderate striction versus air trapping, no bronchodilator response, lung volumes not reported low low normal but TLC is 77% of predicted, mildly reduced to right lower limit of normal likely, DLCO similarly 77% predicted, suspect normal versus mildly reduced.  Given mild reduction in TLC and DLCO, this likely reflects underlying mild interstitial lung disease.,  PFT 07/2024 personally  reviewed interpreted as spirometry shows of mild restriction versus air trapping, lung volumes within normal limits and improved, DLCO within normal limits, normal PFTs overall improved from prior  WALK:      No data to display          Imaging: Personally reviewed and as per EMR discussion this note No results found.  Lab Results: Personally reviewed CBC    Component Value Date/Time   WBC 6.4 08/16/2024 0851   WBC 7.0 01/27/2024 1047   RBC 5.21 08/16/2024 0851   HGB 16.2 08/16/2024 0851   HCT 47.5 08/16/2024 0851   PLT 294 08/16/2024 0851   MCV 91.2 08/16/2024 0851   MCH 31.1 08/16/2024 0851   MCHC 34.1 08/16/2024 0851   RDW 13.0 08/16/2024 0851   LYMPHSABS 1.6 08/16/2024 0851   MONOABS 0.7 08/16/2024 0851   EOSABS 0.4 08/16/2024 0851   BASOSABS 0.1 08/16/2024 0851    BMET    Component Value Date/Time   NA 139 02/15/2024 1002   K 4.1 02/15/2024 1002   CL 106 02/15/2024 1002   CO2 28 02/15/2024 1002   GLUCOSE 93 02/15/2024 1002   BUN 13 02/15/2024 1002   CREATININE 0.98 02/15/2024 1002   CREATININE 0.97 10/14/2020 1141   CALCIUM 9.2 02/15/2024 1002   GFRNONAA >60 02/15/2024 1002   GFRNONAA 83 10/14/2020 1141   GFRAA 96 10/14/2020 1141    BNP    Component Value Date/Time   BNP 33.7 06/29/2024 1315    ProBNP No results found for: PROBNP  Specialty Problems       Pulmonary Problems   Dyspnea on exertion   PFT 02/07/18- Mild restriction, mild Diffusion reduction, FVC 3.49/78%, FEV1 2.90/86%, ratio 0.83, FEF  25-75% 3.01/108%, No R to BD, TLC 77%, DLCO 77%      Obstructive sleep apnea   HST 12/14/17-AHI 37.9/hour, desaturation to 68%, body weight 225 pounds      Seasonal and perennial allergic rhinitis   Remote allergy  skin testing positive for dust mite and other common environmental allergens.      ILD (interstitial lung disease) (HCC)   LIP (lymphoid interstitial pneumonitis) (HCC)    Allergies  Allergen Reactions   Trazodone  And Nefazodone Other (See Comments)    Feels hung over the next day     Immunization History  Administered Date(s) Administered   Influenza,inj,Quad PF,6+ Mos 08/24/2017, 08/31/2018   Influenza-Unspecified 10/07/2020   PFIZER(Purple Top)SARS-COV-2 Vaccination 02/05/2020, 03/11/2020, 10/23/2020   Td 04/16/2023    Past Medical History:  Diagnosis Date   Anxiety    situational anxiety   Arthritis    LEFT knee   Asthma    Cancer (HCC), Melanoma    skin x 3-4 times   GERD (gastroesophageal reflux disease)    on meds   Hiatal hernia    Kidney stones    Multiple gastric ulcers 2015   seen on endoscopy per Dr. Charlanne in [place]    Seasonal allergies    Sleep apnea    sees Dr. Quita Salt, uses CPAP     Tobacco History: Social History   Tobacco Use  Smoking Status Former   Current packs/day: 0.00   Types: Cigarettes   Start date: 06/10/1976   Quit date: 06/11/1979   Years since quitting: 45.3  Smokeless Tobacco Never  Tobacco Comments   pt reports smoker 0.5 pack/month as a teenager 06/11/2019   Counseling given: Not Answered Tobacco comments: pt reports smoker 0.5 pack/month as a teenager 06/11/2019  Continue to not smoke  Outpatient Encounter Medications as of 09/20/2024  Medication Sig   Azelastine -Fluticasone  (DYMISTA ) 137-50 MCG/ACT SUSP Place into the nose.   azithromycin  (ZITHROMAX ) 250 MG tablet Take 250 mg every Monday Wednesday Friday for infection prevention for immunodeficiency   budesonide -glycopyrrolate -formoterol   (BREZTRI  AEROSPHERE) 160-9-4.8 MCG/ACT AERO inhaler Inhale 2 puffs into the lungs in the morning and at bedtime.   EPINEPHrine 0.3 mg/0.3 mL IJ SOAJ injection Inject into the muscle.   omeprazole  (PRILOSEC) 40 MG capsule TAKE 1 CAPSULE (40 MG TOTAL) BY MOUTH DAILY.   pregabalin  (LYRICA ) 300 MG capsule TAKE 1 CAPSULE BY MOUTH EVERY DAY   zolpidem  (AMBIEN ) 10 MG tablet TAKE 1 TABLET BY MOUTH AT BEDTIME FOR SLEEP IF NEEDED   albuterol  (VENTOLIN  HFA) 108 (90 Base) MCG/ACT inhaler Inhale into the lungs every 6 (six) hours as needed for wheezing or shortness of breath.   clobetasol  ointment (TEMOVATE ) 0.05 % Apply topically twice daily to BODY as needed for SEVERE red, sandpaper and thickened like rash.  Do not use on face, groin or armpits.  Use for up to two weeks at a time.   ipratropium-albuterol  (DUONEB) 0.5-2.5 (3) MG/3ML SOLN Take 3 mLs by nebulization every 6 (six) hours as needed (wheezing).   scopolamine  (TRANSDERM-SCOP) 1 MG/3DAYS Place 1 patch (1 mg total) onto the skin every 3 (three) days.   No facility-administered encounter medications on file as of 09/20/2024.     Review of Systems  Review of Systems  N/a Physical Exam  BP 116/74   Pulse 86   Ht 5' 7 (1.702 m) Comment: per pt  Wt 233 lb 4.8 oz (105.8 kg)   SpO2 97%   BMI 36.54 kg/m   Wt Readings from Last 5 Encounters:  09/20/24 233 lb 4.8 oz (105.8 kg)  06/29/24 217 lb 3.2 oz (98.5 kg)  06/27/24 219 lb (99.3 kg)  05/24/24 216 lb (98 kg)  05/17/24 215 lb 4.8 oz (97.7 kg)    BMI Readings from Last 5 Encounters:  09/20/24 36.54 kg/m  06/29/24 33.03 kg/m  06/27/24 33.30 kg/m  05/24/24 33.83 kg/m  05/17/24 33.72 kg/m     Physical Exam General: Sitting in chair, no acute distress Eyes: No icterus Neck: No JVP appreciated Pulmonary: Clear, normal work of breathing Cardiovascular: Regular rate and rhythm, no murmur Abdomen: Nondistended Neuro: Normal gait, no weakness   Assessment & Plan:   Cystic  lung disease, 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  There are thin-walled cysts.  He has burning mouth.  Do wonder if this is a manifestation of underlying Sjogren's although review of literature indicates not a clear connection.  With a thin-walled cysts, burning mouth, as well as report of her diagnosis of immunodeficiency per immunologist of specific antibody deficiency with normal immunoglobulin concentration and normal number of B cells, suspicion for LIP relatively high even though epidemiologically it is rare.  Other considerations include Langerhans but given lack of smoking history this felt less likely.  Adeline Mirza Dubay considered but given other history felt less likely, no characteristic skin manifestations as well.  Seen by [facility] in the interim in 2024, PFTs normal, imaging stable, ultimately agree with clinical diagnosis of LIP. Given stability disease, no further recommendations made.  Repeat CT scan 03/2024 stable.  Repeat PFT 07/2024 stable to improved compared to 2019.  I think overall, his cystic lung disease is not very clinically significant in terms of  his dyspnea as discussed below.  Dyspnea on exertion: Possible contribution from mild interstitial lung disease, L IP.  However repeat PFTs are normal.  No improvement with inhalers.  I think this i contribution from pulmonary causes is small if present at all. Echocardiogram 06/2024 reassuring largely within normal limits.  Minimal coronary artery disease on catheterization 05/2021.  High suspicion for deconditioning as primary driver.  Seems to all of started after COVID infection in 2020.  Referral to pulmonary rehab today.  Preoperative evaluation: Pulmonary medicine is not provide preoperative clearance or other preoperative risk assessment.  Based on the ARISCAT model, patient is low at 1.6% risk of postoperative pulmonary complication if  duration of surgery is less than 3 hours.  If the duration of surgery is greater than 3 hours, patient is intermediate at 13.3% risk of postoperative pulmonary complication.  Notably has normal PFTs and stable imaging of the lungs.  No modifiable risk factors to improve upon prior to surgery.  Return in about 6 months (around 03/21/2025) for f/u Dr. Annella.   Michael JONELLE Annella, MD 09/20/2024

## 2024-09-25 ENCOUNTER — Telehealth: Payer: Self-pay | Admitting: Pulmonary Disease

## 2024-09-25 ENCOUNTER — Encounter (HOSPITAL_COMMUNITY): Payer: Self-pay

## 2024-09-25 NOTE — Pre-Procedure Instructions (Signed)
 Surgical Instructions   Your procedure is scheduled on October 01, 2024. Report to [facility] Main Entrance A at 6:30 A.M., then check in with the Admitting office. Any questions or running late day of surgery: call [phone]  Questions prior to your surgery date: call [phone], Monday-Friday, 8am-4pm. If you experience any cold or flu symptoms such as cough, fever, chills, shortness of breath, etc. between now and your scheduled surgery, please notify us  at the above number.     Remember:  Do not eat after midnight the night before your surgery   You may drink clear liquids until 5:30 AM the morning of your surgery.   Clear liquids allowed are: Water, Non-Citrus Juices (without pulp), Carbonated Beverages, Clear Tea (no milk, honey, etc.), Black Coffee Only (NO MILK, CREAM OR POWDERED CREAMER of any kind), and Gatorade.    Take these medicines the morning of surgery with A SIP OF WATER: budesonide -glycopyrrolate -formoterol  (BREZTRI  AEROSPHERE) inhaler omeprazole  (PRILOSEC)    May take these medicines IF NEEDED: acetaminophen  (TYLENOL )  Azelastine -Fluticasone  (DYMISTA ) nasal spray cetirizine (ZYRTEC)  EPINEPHrine Pen meclizine  (ANTIVERT )    One week prior to surgery, STOP taking any Aspirin  (unless otherwise instructed by your surgeon) Aleve, Naproxen, Ibuprofen, Motrin, Advil, Goody's, BC's, all herbal medications, fish oil, and non-prescription vitamins.                     Do NOT Smoke (Tobacco/Vaping) for 24 hours prior to your procedure.  If you use a CPAP at night, you may bring your mask/headgear for your overnight stay.   You will be asked to remove any contacts, glasses, piercing's, hearing aid's, dentures/partials prior to surgery. Please bring cases for these items if needed.    Patients discharged the day of surgery will not be allowed to drive home, and someone needs to stay with them for 24 hours.  SURGICAL WAITING ROOM VISITATION Patients may have  no more than 2 support people in the waiting area - these visitors may rotate.   Pre-op nurse will coordinate an appropriate time for 1 ADULT support person, who may not rotate, to accompany patient in pre-op.  Children under the age of 63 must have an adult with them who is not the patient and must remain in the main waiting area with an adult.  If the patient needs to stay at the hospital during part of their recovery, the visitor guidelines for inpatient rooms apply.  Please refer to the [facility] website for the visitor guidelines for any additional information.   If you received a COVID test during your pre-op visit  it is requested that you wear a mask when out in public, stay away from anyone that may not be feeling well and notify your surgeon if you develop symptoms. If you have been in contact with anyone that has tested positive in the last 10 days please notify you surgeon.      Pre-operative CHG Bathing Instructions   You can play a key role in reducing the risk of infection after surgery. Your skin needs to be as free of germs as possible. You can reduce the number of germs on your skin by washing with CHG (chlorhexidine gluconate) soap before surgery. CHG is an antiseptic soap that kills germs and continues to kill germs even after washing.   DO NOT use if you have an allergy  to chlorhexidine/CHG or antibacterial soaps. If your skin becomes reddened or irritated, stop using the CHG and notify one of  our RNs at [phone].              TAKE A SHOWER THE NIGHT BEFORE SURGERY   Please keep in mind the following:  DO NOT shave, including legs and underarms, 48 hours prior to surgery.   You may shave your face before/day of surgery.  Place clean sheets on your bed the night before surgery Use a clean washcloth (not used since being washed) for shower. DO NOT sleep with pet's night before surgery.  CHG Shower Instructions:  Wash your face and private area with normal soap.  If you choose to wash your hair, wash first with your normal shampoo.  After you use shampoo/soap, rinse your hair and body thoroughly to remove shampoo/soap residue.  Turn the water OFF and apply half the bottle of CHG soap to a CLEAN washcloth.  Apply CHG soap ONLY FROM YOUR NECK DOWN TO YOUR TOES (washing for 3-5 minutes)  DO NOT use CHG soap on face, private areas, open wounds, or sores.  Pay special attention to the area where your surgery is being performed.  If you are having back surgery, having someone wash your back for you may be helpful. Wait 2 minutes after CHG soap is applied, then you may rinse off the CHG soap.  Pat dry with a clean towel  Put on clean pajamas    Additional instructions for the day of surgery: If you choose, you may shower the morning of surgery with an antibacterial soap.  DO NOT APPLY any lotions, deodorants, cologne, or perfumes.   Do not wear jewelry or makeup Do not wear nail polish, gel polish, artificial nails, or any other type of covering on natural nails (fingers and toes) Do not bring valuables to the hospital. [facility] is not responsible for valuables/personal belongings. Put on clean/comfortable clothes.  Please brush your teeth.  Ask your nurse before applying any prescription medications to the skin.

## 2024-09-25 NOTE — Telephone Encounter (Signed)
 Copy of ov note with risk assessment from Dr. Annella 09/20/24 was faxed to Dr. Yvonne office. Confirmation received.

## 2024-09-26 ENCOUNTER — Other Ambulatory Visit: Payer: Self-pay

## 2024-09-26 ENCOUNTER — Encounter (HOSPITAL_COMMUNITY)
Admission: RE | Admit: 2024-09-26 | Discharge: 2024-09-26 | Disposition: A | Discharge status: Home / Self Care | Source: Ambulatory Visit | Attending: General Surgery | Admitting: General Surgery

## 2024-09-26 ENCOUNTER — Encounter (HOSPITAL_COMMUNITY): Payer: Self-pay

## 2024-09-26 VITALS — BP 126/81 | HR 72 | Temp 97.9°F | Resp 18 | Ht 67.0 in | Wt 220.0 lb

## 2024-09-26 DIAGNOSIS — G4733 Obstructive sleep apnea (adult) (pediatric): Secondary | ICD-10-CM | POA: Diagnosis not present

## 2024-09-26 DIAGNOSIS — I2510 Atherosclerotic heart disease of native coronary artery without angina pectoris: Secondary | ICD-10-CM | POA: Insufficient documentation

## 2024-09-26 DIAGNOSIS — Z01812 Encounter for preprocedural laboratory examination: Secondary | ICD-10-CM | POA: Diagnosis present

## 2024-09-26 DIAGNOSIS — J45909 Unspecified asthma, uncomplicated: Secondary | ICD-10-CM | POA: Insufficient documentation

## 2024-09-26 HISTORY — DX: Personal history of urinary calculi: Z87.442

## 2024-09-26 HISTORY — DX: Paralytic syndrome, unspecified: G83.9

## 2024-09-26 HISTORY — DX: Dyspnea, unspecified: R06.00

## 2024-09-26 HISTORY — DX: Headache, unspecified: R51.9

## 2024-09-26 HISTORY — DX: Depression, unspecified: F32.A

## 2024-09-26 HISTORY — DX: Pneumonia, unspecified organism: J18.9

## 2024-09-26 HISTORY — DX: Family history of other specified conditions: Z84.89

## 2024-09-26 HISTORY — DX: Atherosclerotic heart disease of native coronary artery without angina pectoris: I25.10

## 2024-09-26 HISTORY — DX: Secondary polycythemia: D75.1

## 2024-09-26 HISTORY — DX: Interstitial pulmonary disease, unspecified: J84.9

## 2024-09-26 HISTORY — DX: Disease of blood and blood-forming organs, unspecified: D75.9

## 2024-09-26 LAB — BASIC METABOLIC PANEL WITH GFR
Anion gap: 10 (ref 5–15)
BUN: 15 mg/dL (ref 8–23)
CO2: 28 mmol/L (ref 22–32)
Calcium: 9.3 mg/dL (ref 8.9–10.3)
Chloride: 100 mmol/L (ref 98–111)
Creatinine, Ser: 1.08 mg/dL (ref 0.61–1.24)
GFR, Estimated: >60 mL/min (ref >60)
Glucose, Bld: 89 mg/dL (ref 70–99)
Potassium: 4 mmol/L (ref 3.5–5.1)
Sodium: 138 mmol/L (ref 135–145)

## 2024-09-26 LAB — CBC
HCT: 49.5 % (ref 39.0–52.0)
Hemoglobin: 16.6 g/dL (ref 13.0–17.0)
MCH: 31 pg (ref 26.0–34.0)
MCHC: 33.5 g/dL (ref 30.0–36.0)
MCV: 92.5 fL (ref 80.0–100.0)
Platelets: 276 K/uL (ref 150–400)
RBC: 5.35 MIL/uL (ref 4.22–5.81)
RDW: 13.2 % (ref 11.5–15.5)
WBC: 6.4 K/uL (ref 4.0–10.5)
nRBC: 0 % (ref 0.0–0.2)

## 2024-09-26 NOTE — Progress Notes (Signed)
 PCP - Dr. Garnette Olmsted Cardiologist - Dr. Vishnu Mallipeddi - last office visit 06/29/2024. Will follow-up with cardiologist in [place] Pulmonologist - Dr. Donnice Beals - last office visit 09/20/2024 - notes pre-op evaluation in office note Oncologist - Avinash Pasam Allergy  MD - Hargis Springer  PPM/ICD - Denies Device Orders - n/a Rep Notified - n/a  Chest x-ray - 07/02/2024 - CT Lung 04/04/2024 EKG - 06/29/2024 Stress Test - Denies ECHO - 07/20/2024 Cardiac Cath - 05/22/2021 - no intervention  Sleep Study - +OSA. Pt wears CPAP nightly. Pressure setting 12-15  No DM  Last dose of GLP1 agonist- n/a GLP1 instructions: n/a  Blood Thinner Instructions: n/a Aspirin  Instructions: n/a  ERAS Protcol - Clear liquids until 0530 morning of surgery PRE-SURGERY Ensure or G2- n/a  COVID TEST- n/a   Anesthesia review: Yes. Hx of mild, nonobstructive CAD, ILD, Asthma, OSA on CPAP, polycythemia on subcutaneous IG, Bell's Palsy (last episode this year) and burning mouth syndrome. His next dose of subcutaneous IG will be today, November 5th with next dose to be completed after surgery  Patient denies shortness of breath, fever, cough and chest pain at PAT appointment. Pt denies any respiratory illness/infection in the last two months.    All instructions explained to the patient, with a verbal understanding of the material. Patient agrees to go over the instructions while at home for a better understanding. Patient also instructed to self quarantine after being tested for COVID-19. The opportunity to ask questions was provided.

## 2024-09-27 ENCOUNTER — Telehealth: Payer: Self-pay | Admitting: *Deleted

## 2024-09-27 NOTE — Anesthesia Preprocedure Evaluation (Addendum)
 Anesthesia Evaluation  Patient identified by MRN, date of birth, ID band Patient awake    Reviewed: Allergy  & Precautions, H&P , NPO status , Patient's Chart, lab work & pertinent test results  Airway Mallampati: IV  TM Distance: >3 FB Neck ROM: Full    Dental  (+) Dental Advisory Given, Teeth Intact   Pulmonary neg pulmonary ROS, shortness of breath, asthma , sleep apnea and Continuous Positive Airway Pressure Ventilation , COPD,  COPD inhaler, former smoker Interstitial lung disease   Pulmonary exam normal breath sounds clear to auscultation       Cardiovascular Exercise Tolerance: Poor + CAD (mild, non-obstructive RCA disease by cath '22)  Normal cardiovascular exam Rhythm:Regular Rate:Normal  06/2024 ECHO: EF 60-65%, normal LVF, normal RVF, no significant valvular abnormlities   Neuro/Psych  Headaches PSYCHIATRIC DISORDERS Anxiety Depression    H/o Bell's palsy    GI/Hepatic negative GI ROS, Neg liver ROS, hiatal hernia, PUD,GERD  Medicated and Controlled,,  Endo/Other  negative endocrine ROS  BMI 34.5  Renal/GU negative Renal ROS     Musculoskeletal  (+) Arthritis ,    Abdominal  (+) + obese  Peds  Hematology Hb 16.6, plt 276k   Anesthesia Other Findings   Reproductive/Obstetrics                              Anesthesia Physical Anesthesia Plan  ASA: 3  Anesthesia Plan: General   Post-op Pain Management: Tylenol  PO (pre-op)*   Induction: Intravenous  PONV Risk Score and Plan: 2 and Ondansetron  and Dexamethasone  Airway Management Planned: Oral ETT and Video Laryngoscope Planned  Additional Equipment: None  Intra-op Plan:   Post-operative Plan: Extubation in OR  Informed Consent: I have reviewed the patients History and Physical, chart, labs and discussed the procedure including the risks, benefits and alternatives for the proposed anesthesia with the patient or authorized  representative who has indicated his/her understanding and acceptance.     Dental advisory given  Plan Discussed with: CRNA  Anesthesia Plan Comments: (PAT note by Lynwood Hope, PA-C: Normal sinus rhythm Nonspecific ST abnormality  )         Anesthesia Quick Evaluation

## 2024-09-27 NOTE — Progress Notes (Signed)
 Anesthesia Chart Review:  68 year old male follows with cardiology and pulmonology for history of DOE.  He had prior evaluation for chest pain in 2022 with LHC showing mild nonobstructive CAD.  Echo in 2022 was also unremarkable.  He continues to report DOE.  Last seen by Dr. Mallipeddi 06/29/2024 and repeat echo was ordered to rule out diastolic dysfunction.  Echo 07/20/2024 showed EF 60 to 65%, normal diastolic parameters, normal RV, no significant valvular abnormalities.  No changes to management.  Last seen by pulmonologist Dr. Annella on 09/20/2024.  Recent PFTs done 08/06/2024 were noted to be normal.  History and imaging felt potentially consistent with lymphoid interstitial pneumonia.  Per note, Given his history of pneumonias and recurrent infections pneumatoceles are considered.  There are thin-walled cysts.  He has burning mouth.  Do wonder if this is a manifestation of underlying Sjogren's although review of literature indicates not a clear connection.  With a thin-walled cysts, burning mouth, as well as report of her diagnosis of immunodeficiency per immunologist of specific antibody deficiency with normal immunoglobulin concentration and normal number of B cells, suspicion for LIP relatively high even though epidemiologically it is rare.  Other considerations include Langerhans but given lack of smoking history this felt less likely.  Adeline Mirza Dubay considered but given other history felt less likely, no characteristic skin manifestations as well.  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 Given stability disease, no further recommendations made.  Repeat CT scan 03/2024 stable.  Repeat PFT 07/2024 stable to improved compared to 2019.  I think overall, his cystic lung disease is not very clinically significant in terms of his dyspnea as discussed below.   Dyspnea on exertion: Possible contribution from mild interstitial lung  disease, L IP.  However repeat PFTs are normal.  No improvement with inhalers.  I think this i contribution from pulmonary causes is small if present at all. Echocardiogram 06/2024 reassuring largely within normal limits.  Minimal coronary artery disease on catheterization 05/2021.  High suspicion for deconditioning as primary driver.  Seems to all of started after COVID infection in 2020.  Referral to pulmonary rehab today.   Preoperative evaluation: Pulmonary medicine is not provide preoperative clearance or other preoperative risk assessment.  Based on the ARISCAT model, patient is low at 1.6% risk of postoperative pulmonary complication if duration of surgery is less than 3 hours.  If the duration of surgery is greater than 3 hours, patient is intermediate at 13.3% risk of postoperative pulmonary complication.  Notably has normal PFTs and stable imaging of the lungs.  No modifiable risk factors to improve upon prior to surgery.  Other pertinent history includes GERD on PPI, OSA on CPAP, asthma, anxiety/depression, headaches, polycythemia, Bell's palsy (2009 and 2025).  Preop labs reviewed, WNL.  EKG 06/29/2024: Normal sinus rhythm.  Rate 70. Nonspecific ST abnormality  TTE 07/20/2024: 1. Left ventricular ejection fraction, by estimation, is 60 to 65%. The  left ventricle has normal function. The left ventricle has no regional  wall motion abnormalities. Left ventricular diastolic parameters were  normal. The average left ventricular  global longitudinal strain is -18.5 %. The global longitudinal strain is  normal.   2. Right ventricular systolic function is normal. The right ventricular  size is normal. There is normal pulmonary artery systolic pressure.   3. The mitral valve is normal in structure. No evidence of mitral valve  regurgitation. No evidence of mitral stenosis.  4. The aortic valve is normal in structure. Aortic valve regurgitation is  not visualized. No aortic stenosis is  present.   5. The inferior vena cava is normal in size with greater than 50%  respiratory variability, suggesting right atrial pressure of 3 mmHg.   Cath 05/22/2021: Prox RCA to Mid RCA lesion is 20% stenosed.   Mild non-obstructive plaque in the mid RCA No angiographic evidence of disease in the LAD or Circumflex   Recommendations: Medical management of very mild CAD    Lynwood Geofm [facility] Short Stay Center/Anesthesiology Phone [phone] 09/27/2024 11:12 AM

## 2024-09-27 NOTE — Telephone Encounter (Signed)
 Received fax from Montie Saa, RN BSN from [employer] requesting that Dr Lorin sign, date, and return the treatment nursing plan that she sent to us . Placed on Dr Carita desk for completion.

## 2024-10-01 ENCOUNTER — Ambulatory Visit: Admitting: Internal Medicine

## 2024-10-01 ENCOUNTER — Ambulatory Visit (HOSPITAL_COMMUNITY): Admitting: Anesthesiology

## 2024-10-01 ENCOUNTER — Ambulatory Visit (HOSPITAL_COMMUNITY)
Admission: RE | Admit: 2024-10-01 | Discharge: 2024-10-01 | Disposition: A | Discharge status: Home / Self Care | Attending: General Surgery | Admitting: General Surgery

## 2024-10-01 ENCOUNTER — Ambulatory Visit (HOSPITAL_COMMUNITY): Payer: Self-pay | Admitting: Physician Assistant

## 2024-10-01 ENCOUNTER — Encounter (HOSPITAL_COMMUNITY): Payer: Self-pay | Admitting: General Surgery

## 2024-10-01 ENCOUNTER — Encounter (HOSPITAL_COMMUNITY)
Admission: RE | Disposition: A | Discharge status: Home / Self Care | Payer: Self-pay | Source: Home / Self Care | Attending: General Surgery

## 2024-10-01 DIAGNOSIS — Z87891 Personal history of nicotine dependence: Secondary | ICD-10-CM | POA: Diagnosis not present

## 2024-10-01 DIAGNOSIS — D751 Secondary polycythemia: Secondary | ICD-10-CM | POA: Diagnosis not present

## 2024-10-01 DIAGNOSIS — K429 Umbilical hernia without obstruction or gangrene: Secondary | ICD-10-CM | POA: Diagnosis not present

## 2024-10-01 DIAGNOSIS — I2510 Atherosclerotic heart disease of native coronary artery without angina pectoris: Secondary | ICD-10-CM

## 2024-10-01 DIAGNOSIS — K219 Gastro-esophageal reflux disease without esophagitis: Secondary | ICD-10-CM | POA: Diagnosis not present

## 2024-10-01 DIAGNOSIS — J849 Interstitial pulmonary disease, unspecified: Secondary | ICD-10-CM | POA: Diagnosis not present

## 2024-10-01 DIAGNOSIS — G4730 Sleep apnea, unspecified: Secondary | ICD-10-CM | POA: Diagnosis not present

## 2024-10-01 DIAGNOSIS — K449 Diaphragmatic hernia without obstruction or gangrene: Secondary | ICD-10-CM | POA: Diagnosis not present

## 2024-10-01 DIAGNOSIS — J4489 Other specified chronic obstructive pulmonary disease: Secondary | ICD-10-CM | POA: Insufficient documentation

## 2024-10-01 HISTORY — PX: INSERTION OF MESH: SHX5868

## 2024-10-01 HISTORY — PX: LAPAROSCOPIC ASSISTED VENTRAL HERNIA REPAIR: SHX6312

## 2024-10-01 SURGERY — REPAIR, HERNIA, VENTRAL, LAPAROSCOPY-ASSISTED
Anesthesia: General

## 2024-10-01 MED ORDER — FENTANYL CITRATE (PF) 100 MCG/2ML IJ SOLN
INTRAMUSCULAR | Status: AC
  Filled 2024-10-01: qty 2

## 2024-10-01 MED ORDER — OXYCODONE HCL 5 MG/5ML PO SOLN
5.0000 mg | Freq: Once | ORAL | Status: AC | PRN
  Administered 2024-10-01: 5 mg via ORAL

## 2024-10-01 MED ORDER — OXYCODONE HCL 5 MG PO TABS
5.0000 mg | ORAL_TABLET | Freq: Three times a day (TID) | ORAL | 0 refills | Status: AC | PRN
Start: 2024-10-01 — End: 2025-10-01

## 2024-10-01 MED ORDER — GABAPENTIN 100 MG PO CAPS
100.0000 mg | ORAL_CAPSULE | ORAL | Status: AC
  Administered 2024-10-01: 100 mg via ORAL
  Filled 2024-10-01: qty 1

## 2024-10-01 MED ORDER — HYDROMORPHONE HCL 1 MG/ML IJ SOLN
INTRAMUSCULAR | Status: AC
  Filled 2024-10-01: qty 1

## 2024-10-01 MED ORDER — KETOROLAC TROMETHAMINE 30 MG/ML IJ SOLN
INTRAMUSCULAR | Status: AC
Start: 2024-10-01 — End: 2024-10-01
  Filled 2024-10-01: qty 1

## 2024-10-01 MED ORDER — PROPOFOL 10 MG/ML IV BOLUS
INTRAVENOUS | Status: AC
  Filled 2024-10-01: qty 20

## 2024-10-01 MED ORDER — HYDROMORPHONE HCL 1 MG/ML IJ SOLN
0.2500 mg | INTRAMUSCULAR | Status: DC | PRN
  Administered 2024-10-01: 0.25 mg via INTRAVENOUS

## 2024-10-01 MED ORDER — LACTATED RINGERS IV SOLN: INTRAVENOUS | Status: DC

## 2024-10-01 MED ORDER — ORAL CARE MOUTH RINSE: 15.0000 mL | Freq: Once | OROMUCOSAL | Status: AC

## 2024-10-01 MED ORDER — OXYCODONE HCL 5 MG PO TABS: 5.0000 mg | ORAL_TABLET | Freq: Once | ORAL | Status: AC | PRN

## 2024-10-01 MED ORDER — PROPOFOL 10 MG/ML IV BOLUS
INTRAVENOUS | Status: DC | PRN
  Administered 2024-10-01: 150 mg via INTRAVENOUS

## 2024-10-01 MED ORDER — CEFAZOLIN SODIUM-DEXTROSE 2-4 GM/100ML-% IV SOLN
2.0000 g | INTRAVENOUS | Status: AC
  Administered 2024-10-01: 2 g via INTRAVENOUS
  Filled 2024-10-01: qty 100

## 2024-10-01 MED ORDER — OXYCODONE HCL 5 MG/5ML PO SOLN
ORAL | Status: AC
  Filled 2024-10-01: qty 5

## 2024-10-01 MED ORDER — CHLORHEXIDINE GLUCONATE 0.12 % MT SOLN
15.0000 mL | Freq: Once | OROMUCOSAL | Status: AC
  Administered 2024-10-01: 15 mL via OROMUCOSAL
  Filled 2024-10-01: qty 15

## 2024-10-01 MED ORDER — SUGAMMADEX SODIUM 200 MG/2ML IV SOLN
INTRAVENOUS | Status: DC | PRN
  Administered 2024-10-01: 400 mg via INTRAVENOUS

## 2024-10-01 MED ORDER — MIDAZOLAM HCL 2 MG/2ML IJ SOLN
INTRAMUSCULAR | Status: AC
  Filled 2024-10-01: qty 2

## 2024-10-01 MED ORDER — PHENYLEPHRINE HCL-NACL 20-0.9 MG/250ML-% IV SOLN
INTRAVENOUS | Status: DC | PRN
  Administered 2024-10-01: 35 ug/min via INTRAVENOUS

## 2024-10-01 MED ORDER — 0.9 % SODIUM CHLORIDE (POUR BTL) OPTIME
TOPICAL | Status: DC | PRN
  Administered 2024-10-01: 1000 mL

## 2024-10-01 MED ORDER — MIDAZOLAM HCL (PF) 2 MG/2ML IJ SOLN: 0.5000 mg | Freq: Once | INTRAMUSCULAR | Status: DC | PRN

## 2024-10-01 MED ORDER — MIDAZOLAM HCL (PF) 2 MG/2ML IJ SOLN
INTRAMUSCULAR | Status: DC | PRN
  Administered 2024-10-01: 2 mg via INTRAVENOUS

## 2024-10-01 MED ORDER — CHLORHEXIDINE GLUCONATE CLOTH 2 % EX PADS: 6.0000 | MEDICATED_PAD | Freq: Once | CUTANEOUS | Status: DC

## 2024-10-01 MED ORDER — KETOROLAC TROMETHAMINE 30 MG/ML IJ SOLN
30.0000 mg | Freq: Once | INTRAMUSCULAR | Status: AC
  Administered 2024-10-01: 30 mg via INTRAVENOUS

## 2024-10-01 MED ORDER — ACETAMINOPHEN 500 MG PO TABS
1000.0000 mg | ORAL_TABLET | ORAL | Status: AC
  Administered 2024-10-01: 1000 mg via ORAL
  Filled 2024-10-01: qty 2

## 2024-10-01 MED ORDER — PHENYLEPHRINE 80 MCG/ML (10ML) SYRINGE FOR IV PUSH (FOR BLOOD PRESSURE SUPPORT)
PREFILLED_SYRINGE | INTRAVENOUS | Status: DC | PRN
  Administered 2024-10-01 (×2): 80 ug via INTRAVENOUS

## 2024-10-01 MED ORDER — DEXAMETHASONE SOD PHOSPHATE PF 10 MG/ML IJ SOLN
INTRAMUSCULAR | Status: DC | PRN
  Administered 2024-10-01: 10 mg via INTRAVENOUS

## 2024-10-01 MED ORDER — HYDROMORPHONE HCL 1 MG/ML IJ SOLN
INTRAMUSCULAR | Status: DC | PRN
  Administered 2024-10-01 (×2): .5 mg via INTRAVENOUS

## 2024-10-01 MED ORDER — MEPERIDINE HCL 25 MG/ML IJ SOLN: 6.2500 mg | INTRAMUSCULAR | Status: DC | PRN

## 2024-10-01 MED ORDER — HYDROMORPHONE HCL 1 MG/ML IJ SOLN
0.2500 mg | INTRAMUSCULAR | Status: DC | PRN
  Administered 2024-10-01 (×4): 0.5 mg via INTRAVENOUS

## 2024-10-01 MED ORDER — ONDANSETRON HCL 4 MG/2ML IJ SOLN
INTRAMUSCULAR | Status: DC | PRN
  Administered 2024-10-01: 4 mg via INTRAVENOUS

## 2024-10-01 MED ORDER — ROCURONIUM BROMIDE 10 MG/ML (PF) SYRINGE
PREFILLED_SYRINGE | INTRAVENOUS | Status: DC | PRN
  Administered 2024-10-01: 60 mg via INTRAVENOUS
  Administered 2024-10-01: 20 mg via INTRAVENOUS

## 2024-10-01 MED ORDER — BUPIVACAINE-EPINEPHRINE 0.25% -1:200000 IJ SOLN
INTRAMUSCULAR | Status: DC | PRN
  Administered 2024-10-01: 27 mL

## 2024-10-01 MED ORDER — LACTATED RINGERS IV SOLN
INTRAVENOUS | Status: DC | PRN
Start: 2024-10-01 — End: 2024-10-01

## 2024-10-01 MED ORDER — FENTANYL CITRATE (PF) 250 MCG/5ML IJ SOLN
INTRAMUSCULAR | Status: DC | PRN
  Administered 2024-10-01: 100 ug via INTRAVENOUS
  Administered 2024-10-01 (×2): 50 ug via INTRAVENOUS

## 2024-10-01 MED ORDER — ACETAMINOPHEN 500 MG PO TABS: 1000.0000 mg | ORAL_TABLET | Freq: Once | ORAL | Status: DC

## 2024-10-01 MED ORDER — BUPIVACAINE-EPINEPHRINE (PF) 0.25% -1:200000 IJ SOLN
INTRAMUSCULAR | Status: AC
  Filled 2024-10-01: qty 30

## 2024-10-01 MED ORDER — LIDOCAINE 2% (20 MG/ML) 5 ML SYRINGE
INTRAMUSCULAR | Status: DC | PRN
  Administered 2024-10-01: 20 mg via INTRAVENOUS

## 2024-10-01 SURGICAL SUPPLY — 40 items
BINDER ABDOMINAL 12 ML 46-62 (SOFTGOODS) IMPLANT
BNDG GAUZE DERMACEA FLUFF 4 (GAUZE/BANDAGES/DRESSINGS) IMPLANT
CANISTER SUCTION 3000ML PPV (SUCTIONS) IMPLANT
CHLORAPREP W/TINT 26 (MISCELLANEOUS) ×2 IMPLANT
COVER SURGICAL LIGHT HANDLE (MISCELLANEOUS) ×2 IMPLANT
DERMABOND ADVANCED .7 DNX12 (GAUZE/BANDAGES/DRESSINGS) ×2 IMPLANT
DEVICE SECURE STRAP 25 ABSORB (INSTRUMENTS) ×2 IMPLANT
DEVICE TROCAR PUNCTURE CLOSURE (ENDOMECHANICALS) ×2 IMPLANT
DRAPE INCISE IOBAN 66X45 STRL (DRAPES) ×2 IMPLANT
DRAPE LAPAROSCOPIC ABDOMINAL (DRAPES) ×2 IMPLANT
ELECT CAUTERY BLADE 6.4 (BLADE) ×2 IMPLANT
ELECTRODE REM PT RTRN 9FT ADLT (ELECTROSURGICAL) ×2 IMPLANT
GAUZE PAD ABD 8X10 STRL (GAUZE/BANDAGES/DRESSINGS) IMPLANT
GLOVE BIO SURGEON STRL SZ7.5 (GLOVE) ×2 IMPLANT
GOWN STRL REUS W/ TWL LRG LVL3 (GOWN DISPOSABLE) ×6 IMPLANT
IRRIGATION SUCT STRKRFLW 2 WTP (MISCELLANEOUS) IMPLANT
KIT BASIN OR (CUSTOM PROCEDURE TRAY) ×2 IMPLANT
KIT TURNOVER KIT B (KITS) ×2 IMPLANT
MARKER SKIN DUAL TIP RULER LAB (MISCELLANEOUS) ×2 IMPLANT
MESH OVITEX CORE PERM 12X12 4L (Mesh General) IMPLANT
NDL SPNL 22GX3.5 QUINCKE BK (NEEDLE) ×2 IMPLANT
NEEDLE SPNL 22GX3.5 QUINCKE BK (NEEDLE) ×1 IMPLANT
PAD ARMBOARD POSITIONER FOAM (MISCELLANEOUS) ×4 IMPLANT
PENCIL BUTTON HOLSTER BLD 10FT (ELECTRODE) ×2 IMPLANT
SCISSORS LAP 5X35 DISP (ENDOMECHANICALS) IMPLANT
SET TUBE SMOKE EVAC HIGH FLOW (TUBING) ×2 IMPLANT
SHEARS HARMONIC 36 ACE (MISCELLANEOUS) IMPLANT
SLEEVE Z-THREAD 5X100MM (TROCAR) ×2 IMPLANT
SOLN 0.9% NACL POUR BTL 1000ML (IV SOLUTION) ×2 IMPLANT
SOLN STERILE WATER BTL 1000 ML (IV SOLUTION) ×2 IMPLANT
SUT MNCRL AB 4-0 PS2 18 (SUTURE) ×2 IMPLANT
SUT NOVA NAB DX-16 0-1 5-0 T12 (SUTURE) ×2 IMPLANT
SUT VIC AB 3-0 SH 27XBRD (SUTURE) ×2 IMPLANT
TOWEL GREEN STERILE FF (TOWEL DISPOSABLE) ×2 IMPLANT
TRAY FOLEY W/BAG SLVR 16FR ST (SET/KITS/TRAYS/PACK) ×2 IMPLANT
TRAY LAPAROSCOPIC MC (CUSTOM PROCEDURE TRAY) ×2 IMPLANT
TROCAR 11X100 Z THREAD (TROCAR) IMPLANT
TROCAR BALLN 12MMX100 BLUNT (TROCAR) IMPLANT
TROCAR Z-THREAD OPTICAL 5X100M (TROCAR) ×2 IMPLANT
WARMER LAPAROSCOPE (MISCELLANEOUS) ×2 IMPLANT

## 2024-10-01 NOTE — Interval H&P Note (Signed)
 History and Physical Interval Note:  10/01/2024 7:30 AM  Michael Bernard  has presented today for surgery, with the diagnosis of UMBILICAL HERNIA.  The various methods of treatment have been discussed with the patient and family. After consideration of risks, benefits and other options for treatment, the patient has consented to  Procedure(s): REPAIR, HERNIA, VENTRAL, LAPAROSCOPY-ASSISTED (N/A) as a surgical intervention.  The patient's history has been reviewed, patient examined, no change in status, stable for surgery.  I have reviewed the patient's chart and labs.  Questions were answered to the patient's satisfaction.     Deward Null III

## 2024-10-01 NOTE — Anesthesia Postprocedure Evaluation (Signed)
 Anesthesia Post Note  Patient: Michael Bernard  Procedure(s) Performed: REPAIR, HERNIA, VENTRAL, LAPAROSCOPY-ASSISTED INSERTION OF MESH     Patient location during evaluation: PACU Anesthesia Type: General Level of consciousness: awake and alert, patient cooperative and oriented Pain management: pain level controlled Vital Signs Assessment: post-procedure vital signs reviewed and stable Respiratory status: spontaneous breathing, nonlabored ventilation and respiratory function stable Cardiovascular status: blood pressure returned to baseline and stable Postop Assessment: no apparent nausea or vomiting and able to ambulate Anesthetic complications: no   No notable events documented.  Last Vitals:  Vitals:   10/01/24 1045 10/01/24 1100  BP: 95/73 110/81  Pulse: (!) 59 63  Resp: 15 17  Temp:  36.6 C  SpO2: 92% 93%    Last Pain:  Vitals:   10/01/24 1037  TempSrc:   PainSc: 8                  Magda Muise,E. Vanassa Penniman

## 2024-10-01 NOTE — H&P (Signed)
 PROVIDER: DEWARD GARNETTE NULL, MD MRN: [id number] DOB: 1956-09-20 Subjective   Chief Complaint: Follow-up  History of Present Illness: Michael Bernard is a 68 y.o. male who is seen today as an office consultation for evaluation of Follow-up  We are asked to see the patient in consultation by Dr. Garnette Olmsted to evaluate him for an umbilical hernia. The patient is a 68 year old white male who has noticed a bulge near his umbilicus for several years. He has some occasional tenderness associated with it. He denies any nausea or vomiting. He did have a recent CT scan that did show what looks like multiple small defects just above the umbilicus as well as a rectus diastases. He does report a ill-defined hematologic disorder. He is seeing oncology for this in a couple weeks. He does not smoke. Since his last visit he has been diagnosed with polycythemia and interstitial lung disease. He is getting ready to start IVIG therapy. He is still active and does not use oxygen at home.  Review of Systems: A complete review of systems was obtained from the patient. I have reviewed this information and discussed as appropriate with the patient. See HPI as well for other ROS.  ROS   Medical History: Past Medical History:  Diagnosis Date  Asthma, unspecified asthma severity, unspecified whether complicated, unspecified whether persistent ([facility]HS-HCC)  GERD (gastroesophageal reflux disease)  Sleep apnea   Patient Active Problem List  Diagnosis  Umbilical hernia without obstruction or gangrene   Past Surgical History:  Procedure Laterality Date  double hernia  Knee surgery    Allergies  Allergen Reactions  Penicillins Hives  Nefazodone Other (See Comments)  Feels hung over the next day  Trazodone  Other (See Comments)  Feels hung over the next day   Current Outpatient Medications on File Prior to Visit  Medication Sig Dispense Refill  cyanocobalamin  (VITAMIN B12) 1000 MCG tablet Take 1,000 mcg by  mouth once daily  meclizine  (ANTIVERT ) 25 MG tablet Take by mouth  omeprazole  (PRILOSEC) 40 MG DR capsule Take 40 mg by mouth once daily  pregabalin  (LYRICA ) 300 MG capsule Take 300 mg by mouth once daily  zolpidem  (AMBIEN ) 10 mg tablet 1 at bedtime for sleep if needed   No current facility-administered medications on file prior to visit.   Family History  Problem Relation Age of Onset  Hyperlipidemia (Elevated cholesterol) Mother  Obesity Father  Coronary Artery Disease (Blocked arteries around heart) Father    Social History   Tobacco Use  Smoking Status Never  Smokeless Tobacco Never    Social History   Socioeconomic History  Marital status: Married  Tobacco Use  Smoking status: Never  Smokeless tobacco: Never  Substance and Sexual Activity  Alcohol use: Defer  Drug use: Defer   Social Drivers of Health   Financial Resource Strain: Low Risk (01/26/2024)  Received from [facility]  Overall Financial Resource Strain (CARDIA)  Difficulty of Paying Living Expenses: Not hard at all  Food Insecurity: No Food Insecurity (02/15/2024)  Received from [facility]  Hunger Vital Sign  Within the past 12 months, you worried that your food would run out before you got the money to buy more.: Never true  Within the past 12 months, the food you bought just didn't last and you didn't have money to get more.: Never true  Transportation Needs: No Transportation Needs (02/15/2024)  Received from [facility] - Transportation  Lack of Transportation (Medical): No  Lack of Transportation (Non-Medical): No  Physical Activity: Inactive (01/26/2024)  [facility] from [facility]  Exercise Vital Sign  On average, how many days per week do you engage in moderate to strenuous exercise (like a brisk walk)?: 0 days  On average, how many minutes do you engage in exercise at this level?: 0 min  Stress: Stress Concern Present (01/26/2024)  Received from [facility] of  Occupational Health - Occupational Stress Questionnaire  Feeling of Stress : To some extent  Social Connections: Socially Integrated (01/26/2024)  Received from [facility]  Social Connection and Isolation Panel  In a typical week, how many times do you talk on the phone with family, friends, or neighbors?: More than three times a week  How often do you get together with friends or relatives?: Once a week  How often do you attend church or religious services?: More than 4 times per year  Do you belong to any clubs or organizations such as church groups, unions, fraternal or athletic groups, or school groups?: No  How often do you attend meetings of the clubs or organizations you belong to?: More than 4 times per year  Are you married, widowed, divorced, separated, never married, or living with a partner?: Married  Housing Stability: Unknown (02/07/2024)  Housing Stability Vital Sign  Homeless in the Last Year: No   Objective:   Vitals:  PainSc: 0-No pain   There is no height or weight on file to calculate BMI.  Physical Exam Constitutional:  General: He is not in acute distress. Appearance: Normal appearance.  HENT:  Head: Normocephalic and atraumatic.  Right Ear: External ear normal.  Left Ear: External ear normal.  Nose: Nose normal.  Mouth/Throat:  Mouth: Mucous membranes are moist.  Pharynx: Oropharynx is clear.  Eyes:  General: No scleral icterus. Extraocular Movements: Extraocular movements intact.  Conjunctiva/sclera: Conjunctivae normal.  Pupils: Pupils are equal, round, and reactive to light.  Cardiovascular:  Rate and Rhythm: Normal rate and regular rhythm.  Pulses: Normal pulses.  Heart sounds: Normal heart sounds.  Pulmonary:  Effort: Pulmonary effort is normal. No respiratory distress.  Breath sounds: Normal breath sounds.  Abdominal:  General: Abdomen is flat. Bowel sounds are normal. There is no distension.  Palpations: Abdomen is soft.  Tenderness: There  is no abdominal tenderness.  Comments: There is a rectus diastases along the upper abdomen. There is a small fatty bulge just above the umbilicus that does not seem to reduce very well. There is no sign of obstruction  Musculoskeletal:  General: No swelling or deformity. Normal range of motion.  Cervical back: Normal range of motion and neck supple. No tenderness.  Skin: General: Skin is warm and dry.  Coloration: Skin is not jaundiced.  Neurological:  General: No focal deficit present.  Mental Status: He is alert and oriented to person, place, and time.  Psychiatric:  Mood and Affect: Mood normal.  Behavior: Behavior normal.     Labs, Imaging and Diagnostic Testing:  Assessment and Plan:   Diagnoses and all orders for this visit:  Umbilical hernia without obstruction or gangrene - [facility] Case Posting Request; Future   The patient appears to have multiple small fascial defects just above the umbilicus consistent with umbilical hernia. Because of the risk of incarceration and strangulation I think he would benefit from having the hernias fixed. He would also like to have this done. I have discussed with him in detail the risks and benefits of the operation as well as some of the  technical aspects and I feel he would be a good candidate for a laparoscopic assisted type repair with mesh. We will move forward with surgical scheduling. He will notify his oncologist of the pending surgery

## 2024-10-01 NOTE — Transfer of Care (Signed)
 Immediate Anesthesia Transfer of Care Note  Patient: Michael Bernard  Procedure(s) Performed: REPAIR, HERNIA, VENTRAL, LAPAROSCOPY-ASSISTED INSERTION OF MESH  Patient Location: PACU  Anesthesia Type:General  Level of Consciousness: awake, alert , oriented, and patient cooperative  Airway & Oxygen Therapy: Patient Spontanous Breathing and Patient connected to face mask oxygen  Post-op Assessment: Report given to RN, Post -op Vital signs reviewed and stable, Patient moving all extremities, and Patient moving all extremities X 4  Post vital signs: Reviewed and stable  Last Vitals:  Vitals Value Taken Time  BP 130/97 10/01/24 09:45  Temp 36.6 C 10/01/24 09:45  Pulse 62 10/01/24 09:48  Resp 12 10/01/24 09:48  SpO2 96 % 10/01/24 09:48  Vitals shown include unfiled device data.  Last Pain:  Vitals:   10/01/24 0640  TempSrc: Oral         Complications: No notable events documented.

## 2024-10-01 NOTE — Op Note (Signed)
 10/01/2024  9:37 AM  PATIENT:  Michael Bernard  68 y.o. male  PRE-OPERATIVE DIAGNOSIS:  UMBILICAL HERNIA  POST-OPERATIVE DIAGNOSIS:  UMBILICAL HERNIA  PROCEDURE:  Procedure(s): REPAIR, HERNIA, VENTRAL, LAPAROSCOPY-ASSISTED (N/A) INSERTION OF MESH (N/A)  SURGEON:  Surgeons and Role:    * Curvin Deward MOULD, MD - Primary  PHYSICIAN ASSISTANT:   ASSISTANTS: Dr. Cooper   ANESTHESIA:   local and general  EBL:  minimal   BLOOD ADMINISTERED:none  DRAINS: none   LOCAL MEDICATIONS USED:  MARCAINE     SPECIMEN:  No Specimen  DISPOSITION OF SPECIMEN:  N/A  COUNTS:  YES  TOURNIQUET:  * No tourniquets in log *  DICTATION: .Dragon Dictation  After informed consent was obtained the patient was brought to the operating room and placed in the supine position on the operating table.  After adequate induction of general anesthesia the patient's abdomen was prepped with ChloraPrep, allowed to dry, and draped in usual sterile manner including the use of an Ioban drape.  An appropriate timeout was performed.  The site was chosen to access the abdominal cavity in the left upper quadrant.  This area was infiltrated with quarter percent Marcaine.  A small stab incision was made with a 15 blade knife.  A 5 mm Optiview port and camera were used to bluntly dissected the layers of the abdominal wall under direct vision until access was gained to the abdominal cavity.  The abdomen was then insufflated with carbon oxide without difficulty.  A second 5 mm port was placed in the left lower quadrant under direct vision after infiltrating the area with quarter percent Marcaine.  There was some omental adhesion to the hernia around the umbilicus.  This was taken down sharply with the harmonic scalpel.  I was then able to identify 3 small hernia defects adjacent to each other in this location.  The total area covered by the hernia defects was approximately 4 cm.  A 12 x 12cm piece of Ovitex LPR was chosen.  The  mesh was oriented with the blue side towards the anterior abdominal wall.  Four #1 Novafil stitches were placed at equidistant points around the edge of the mesh.  Next a small incision was made just above the umbilicus through the middle of the hernias.  The incision was carried through the skin and subcutaneous tissue sharply with the electrocautery.  Once the hernia sac was entered I was then able to identify the fascial defects.  They were joined sharply with the electrocautery.  The hernia sac was excised sharply with the electrocautery.  The mesh was then placed into the abdominal cavity through this fascial opening with the appropriate orientation.  The fascial defect was then closed with interrupted #1 Novafil stitches.  The abdomen was then insufflated again.  4 small stab incisions were made in the abdominal wall corresponding to the sites of the anchor stitches.  A suture passer was then placed through each 1 of these incisions and used to bring the appropriate tails of the stitch through the abdominal wall.  All of the stitches were then cinched up and tied.  The mesh was observed to be in good apposition to the anterior abdominal wall.  The gaps between the stitches were filled in with a secure strap tacker.  Once this was accomplished then again the mesh was a observed to be in good apposition to the abdominal wall without any gaps or redundancy.  The area appeared to be hemostatic.  The  abdomen was then inspected and no other abnormalities were noted.  The gas was then allowed to escape and the mesh was observed to remain in good apposition to the abdominal wall.  The ports were removed.  The subcutaneous tissue of the supraumbilical incision was closed with interrupted 3-0 Vicryl stitches.  The skin incisions were then closed with 4-0 Monocryl subcuticular stitches.  Dermabond dressings were applied.  The patient tolerated the procedure well.  At the end of the case all needle sponge and instrument  counts were correct.  The patient was then awakened and taken to recovery in stable condition.  PLAN OF CARE: Discharge to home after PACU  PATIENT DISPOSITION:  PACU - hemodynamically stable.   Delay start of Pharmacological VTE agent (>24hrs) due to surgical blood loss or risk of bleeding: not applicable

## 2024-10-01 NOTE — Progress Notes (Signed)
 Wasted 0.75 mg Dilaudid with Leontine, CHARITY FUNDRAISER at [phone]. Pt has been discharged on dayshift prior to medication being wasted.

## 2024-10-01 NOTE — Anesthesia Procedure Notes (Signed)
 Procedure Name: Intubation Date/Time: 10/01/2024 8:18 AM  Performed by: Arvell Edsel HERO, CRNAPre-anesthesia Checklist: Patient identified, Emergency Drugs available, Suction available, Patient being monitored and Timeout performed Patient Re-evaluated:Patient Re-evaluated prior to induction Oxygen Delivery Method: Circle system utilized Preoxygenation: Pre-oxygenation with 100% oxygen Induction Type: IV induction Ventilation: Oral airway inserted - appropriate to patient size and Two handed mask ventilation required Laryngoscope Size: Glidescope and 4 Grade View: Grade I Tube size: 7.0 mm Number of attempts: 1 Airway Equipment and Method: Patient positioned with wedge pillow and Video-laryngoscopy Placement Confirmation: ETT inserted through vocal cords under direct vision, positive ETCO2 and breath sounds checked- equal and bilateral Secured at: 23 cm Tube secured with: Tape Dental Injury: Teeth and Oropharynx as per pre-operative assessment

## 2024-10-02 ENCOUNTER — Ambulatory Visit: Payer: Medicare HMO | Admitting: Internal Medicine

## 2024-10-02 ENCOUNTER — Ambulatory Visit: Admitting: Pulmonary Disease

## 2024-10-02 ENCOUNTER — Encounter (HOSPITAL_COMMUNITY): Payer: Self-pay | Admitting: General Surgery

## 2024-10-05 ENCOUNTER — Ambulatory Visit (HOSPITAL_BASED_OUTPATIENT_CLINIC_OR_DEPARTMENT_OTHER): Admission: EM | Admit: 2024-10-05 | Discharge: 2024-10-05 | Disposition: A | Discharge status: Home / Self Care

## 2024-10-05 NOTE — ED Provider Notes (Addendum)
 [facility]    CSN: [id number] Arrival date & time: 10/05/24  0913      History   Chief Complaint No chief complaint on file.   HPI Michael Bernard is a 68 y.o. male.   68 year old male who had a hernia repair with mesh on Monday, 10/01/2024.  He has not had a bowel movement since that time.  He is bloated and has tried everything that his surgeons office suggested.  They encouraged him to go to the [facility] room but the patient came here.  He is in pain and cannot have a bowel movement.  I spoke with the patient at the front reception area.  I alerted him that we are happy to see him and work him up but that as an urgent care there were some limitations to the services we could provide and given his recent surgery I felt that it would be better for him to go to the emergency room of the hospital where he had the surgery in case his surgeon needed to be called or at minimum go to an emergency room where he could have advanced imaging if needed, onsite lab work and even IV fluids if needed.  The patient and his wife have decided to go onto an emergency room at [facility].     Past Medical History:  Diagnosis Date   Anxiety    situational anxiety   Arthritis    Hips, Back   Asthma    Blood dyscrasia    Polycethemia   Cancer (HCC), Melanoma    skin x 3-4 times   Coronary artery disease    MIld, nonobstructive   Depression    Dyspnea    Family history of adverse reaction to anesthesia    Mother took very long time to wake up from anesthesia   GERD (gastroesophageal reflux disease)    on meds   Headache    r/t SCIG   Hiatal hernia    History of kidney stones    ILD (interstitial lung disease) (HCC)    Multiple gastric ulcers 2015   seen on endoscopy per Dr. Charlanne in [place]    Paralysis [facility])    2009 and 2025 Bell's Palsy   Pneumonia    Polycythemia    Seasonal allergies    Sleep apnea    sees Dr. Quita Salt, uses CPAP      Patient Active Problem List   Diagnosis Date Noted   Cardiac enlargement 06/29/2024   LIP (lymphoid interstitial pneumonitis) (HCC) 03/21/2024   Bell's palsy 02/28/2024   Postherpetic neuralgia 02/28/2024   Secondary polycythemia 02/15/2024   ILD (interstitial lung disease) (HCC) 04/05/2023   Umbilical hernia 12/02/2022   Burning mouth syndrome 12/02/2022   B12 deficiency 11/30/2021   Chronic fatigue 11/30/2021   Vertigo 11/30/2021   COVID-19 virus infection 08/24/2021   Cardiac chest pain    Progressive angina (HCC) 05/21/2021   Unstable angina (HCC)    Seasonal and perennial allergic rhinitis 06/10/2018   Obstructive sleep apnea 11/07/2017   Insomnia 11/07/2017   Dyspnea on exertion 11/07/2017   Multiple gastric ulcers 08/24/2017   Hx of malignant melanoma of skin 08/24/2017   GERD 09/18/2007   NEPHROLITHIASIS, HX OF 09/18/2007    Past Surgical History:  Procedure Laterality Date   COLONOSCOPY  07/20/2022   per Dr. Lynnie Charlanne, adenomatous polyps, repeat in 3 yrs   HERNIA REPAIR Bilateral    inguinal hernias repaired with mesh  INSERTION OF MESH N/A 10/01/2024   Procedure: INSERTION OF MESH;  Surgeon: Curvin Deward MOULD, MD;  Location: [facility] OR;  Service: General;  Laterality: N/A;   KNEE SURGERY Left    arthritis and meniscal tear   LAPAROSCOPIC ASSISTED VENTRAL HERNIA REPAIR N/A 10/01/2024   Procedure: REPAIR, HERNIA, VENTRAL, LAPAROSCOPY-ASSISTED;  Surgeon: Curvin Deward MOULD, MD;  Location: [facility] OR;  Service: General;  Laterality: N/A;   LEFT HEART CATH AND CORONARY ANGIOGRAPHY N/A 05/22/2021   Procedure: LEFT HEART CATH AND CORONARY ANGIOGRAPHY;  Surgeon: Verlin Lonni BIRCH, MD;  Location: [facility] INVASIVE CV LAB;  Service: Cardiovascular;  Laterality: N/A;   MELANOMA EXCISION     per Dr. Trudy in [place], 3 removed from both shoulders and left temple    POLYPECTOMY  2017   TA       Home Medications    Prior to Admission medications   Medication Sig Start  Date End Date Taking? Authorizing Provider  acetaminophen  (TYLENOL ) 500 MG tablet Take 1,000 mg by mouth every 6 (six) hours as needed for moderate pain (pain score 4-6).    [provider]  Azelastine -Fluticasone  (DYMISTA ) 137-50 MCG/ACT SUSP Place 1 spray into both nostrils daily as needed (allergies).    [provider]  azithromycin  (ZITHROMAX ) 250 MG tablet Take 250 mg every Monday Wednesday Friday for infection prevention for immunodeficiency Patient not taking: Reported on 09/21/2024 03/29/24   Lorin Norris, MD  budesonide -glycopyrrolate -formoterol  (BREZTRI  AEROSPHERE) 160-9-4.8 MCG/ACT AERO inhaler Inhale 2 puffs into the lungs in the morning and at bedtime. 08/03/24   Hunsucker, Donnice SAUNDERS, MD  cetirizine (ZYRTEC) 10 MG tablet Take 10 mg by mouth daily as needed for allergies.    [provider]  EPINEPHrine 0.3 mg/0.3 mL IJ SOAJ injection Inject 0.3 mg into the muscle as needed for anaphylaxis. 08/22/24   [provider]  meclizine  (ANTIVERT ) 25 MG tablet Take 25 mg by mouth 3 (three) times daily as needed for dizziness.    [provider]  omeprazole  (PRILOSEC) 40 MG capsule TAKE 1 CAPSULE (40 MG TOTAL) BY MOUTH DAILY. 09/14/24   Johnny Garnette LABOR, MD  oxyCODONE (ROXICODONE) 5 MG immediate release tablet Take 1 tablet (5 mg total) by mouth every 8 (eight) hours as needed. 10/01/24 10/01/25  Curvin Deward III, MD  pregabalin  (LYRICA ) 300 MG capsule TAKE 1 CAPSULE BY MOUTH EVERY DAY Patient taking differently: Take 300 mg by mouth at bedtime. 04/27/24   Johnny Garnette LABOR, MD  tacrolimus (PROTOPIC) 0.1 % ointment Apply 1 Application topically daily. 07/24/24   [provider]  zolpidem  (AMBIEN ) 10 MG tablet TAKE 1 TABLET BY MOUTH AT BEDTIME FOR SLEEP IF NEEDED Patient taking differently: Take 10 mg by mouth at bedtime. 04/26/24   Neysa Reggy BIRCH, MD    Family History Family History  Problem Relation Age of Onset   Colon polyps Mother    Arthritis  Mother    Cancer Mother    Hyperlipidemia Mother    Hypertension Mother    Leukemia Mother    Atrial fibrillation Father    Heart attack Father    Heart disease Father    Hypertension Father    Hypertension Sister    Seizures Sister    Colon cancer Neg Hx    Esophageal cancer Neg Hx    Stomach cancer Neg Hx    Rectal cancer Neg Hx     Social History Social History   Tobacco Use   Smoking status: Former  Current packs/day: 0.00    Types: Cigarettes    Start date: 06/10/1976    Quit date: 06/11/1979    Years since quitting: 45.3   Smokeless tobacco: Never   Tobacco comments:    pt reports smoker 0.5 pack/month as a teenager 06/11/2019  Vaping Use   Vaping status: Never Used  Substance Use Topics   Alcohol use: Not Currently    Alcohol/week: 1.0 - 2.0 standard drink of alcohol    Types: 1 - 2 Cans of beer per week    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   Drug use: Never     Allergies   Trazodone  and nefazodone   Review of Systems Review of Systems   Physical Exam Triage Vital Signs ED Triage Vitals  Encounter Vitals Group     BP      Girls Systolic BP Percentile      Girls Diastolic BP Percentile      Boys Systolic BP Percentile      Boys Diastolic BP Percentile      Pulse      Resp      Temp      Temp src      SpO2      Weight      Height      Head Circumference      Peak Flow      Pain Score      Pain Loc      Pain Education      Exclude from Growth Chart    No data found.  Updated Vital Signs There were no vitals taken for this visit.  Visual Acuity Right Eye Distance:   Left Eye Distance:   Bilateral Distance:    Right Eye Near:   Left Eye Near:    Bilateral Near:     Physical Exam   UC Treatments / Results  Labs (all labs ordered are listed, but only abnormal results are displayed) Labs Reviewed - No data to display  EKG   Radiology No results found.  Procedures Procedures (including critical care  time)  Medications Ordered in UC Medications - No data to display  Initial Impression / Assessment and Plan / UC Course  I have reviewed the triage vital signs and the nursing notes.  Pertinent labs & imaging results that were available during my care of the patient were reviewed by me and considered in my medical decision making (see chart for details).  Left prior to being seen to go to an emergency room for more advanced care. Final Clinical Impressions(s) / UC Diagnoses   Final diagnoses:  None   Discharge Instructions   None    ED Prescriptions   None    PDMP not reviewed this encounter.   Ival Domino, FNP 10/05/24 0957    Ival Domino, FNP 10/08/24 1435

## 2024-10-09 ENCOUNTER — Ambulatory Visit: Admitting: Pulmonary Disease

## 2024-10-24 NOTE — Progress Notes (Signed)
 Patient ID: Nadia Torr, male   DOB: [birth year], 68 y.o.   MRN: [id number]  HPI  male former smoker for sleep evaluation.referred by Dr Johnny for ? OSA, wakes up during the night. HST 12/14/17-AHI 37.9/hour, desaturation to 68%, body weight 225 pounds PFT 02/07/18- Mild restriction, mild Diffusion reduction, FVC 3.49/78%, FEV1 2.90/86%, ratio 0.83, FEF 25-75% 3.01/108%, No R to BD, TLC 77%, DLCO 77% ------------------------------------------------------------------------------------   10/03/23- 67 yom former smoker followed for OSA, Insomnia, complicated by Burning Mouth Syndrome,  Followed by Dr Annella and [facility] for DOE with ILD/ fibrocystic lung disease/ Lymphocytic Interstitial Pneumonia , chronic Stomatitis, allergic Rhinitis -Temazepam  30,  CPAP auto 5-20/ [employer]   new 2019 Download compliance- 100%, AHI 0.2/hr Body weight today-221 lbs Will get flu vax from PCP. Says he is doing very well sith CPAP, used every night Insurance won't cover Ambien  or temazepam , but he says ambien  worked best for insomnia. Now wakes 3AM and lies in bed. We will let him try trazodone  and discussed sleep hygiene. Has seen multiple physicians for burning mouth syndrome. Dr Annella has referred to Dr Lobo/ [facility]- observation status ILD.  10/23/24- 68 yom former smoker followed for OSA, Insomnia, complicated by Burning Mouth Syndrome,  Followed by Dr Annella and [facility] for DOE with ILD/ fibrocystic lung disease/ Lymphocytic Interstitial Pneumonia , chronic Stomatitis, allergic Rhinitis CPAP auto 5-15/ [employer]   new July, 2024 Download compliance- 80%, AHI 0.6/hr Body weight today-214 lbs ED visit after obstipation complicating hernia repair with mesh> [facility]. Discussed the use of AI scribe software for clinical note transcription with the patient, who gave verbal consent to proceed.  History of Present Illness   Dequavion Follette is a 68 year old male who presents with  issues related to CPAP machine usage and medication management.  He reports that his CPAP machine turns itself back on after he switches it off in the morning, and he is unsure if this is normal or a malfunction. He also has difficulty putting on the CPAP headpiece while lying down because he cannot lift his head. Now recovering from umbilical hernia surgery.  At night he takes zolpidem  tartrate 10 mg and pregabalin , which help his sleep and are required for burning mouth pain control. He previously tried alternative sleep medications but had nightmares and returned to zolpidem .  He is on Breztri , which he finds helpful, especially during certain times of the year.     CXR MPRESSION: No active cardiopulmonary disease  Assessment and Plan:    Obstructive sleep apnea Issues with CPAP machine turning on after being paused. Difficulty adjusting routine due to upcoming surgery. Machine is about a year old. - Consult home care company regarding CPAP machine issues. - Continue current CPAP settings.  Chronic pain and burning mouth syndrome Managed with pregabalin . Significant improvement in symptoms with pregabalin , especially with adequate sleep. - Continue pregabalin  for chronic pain and burning mouth syndrome.  Insomnia Previous medication changes caused nightmares. - Ensure zolpidem  prescription is refilled as needed.  Gastroesophageal reflux disease - Continue acid blockeror gastroesophageal reflux disease.     ROS-see HPI   + = positive Constitutional:    weight loss, night sweats, fevers, chills, fatigue, lassitude. HEENT:    headaches, difficulty swallowing, tooth/dental problems, sore throat,       sneezing, itching, ear ache, +nasal congestion, post nasal drip, snoring CV:    chest pain, orthopnea, PND, swelling in lower extremities, anasarca,  dizziness, palpitations Resp:  + shortness of breath with exertion or at rest.                + productive cough,   non-productive cough, coughing up of blood.              change in color of mucus.  wheezing.   Skin:    rash or lesions. GI:     heartburn,+ indigestion, abdominal pain, nausea, vomiting, diarrhea,                 change in bowel habits, loss of appetite GU: dysuria, change in color of urine, no urgency or frequency.   flank pain. MS:   joint pain, stiffness, decreased range of motion, back pain. Neuro-     nothing unusual Psych:  change in mood or affect.  depression or anxiety.   memory loss.   OBJ- Physical Exam General- Alert, Oriented, Affect-appropriate, Distress- none acute,  + overweight Skin- rash-none, lesions- none, excoriation- none Lymphadenopathy- none Head- atraumatic            Eyes- Gross vision intact, PERRLA, conjunctivae and secretions clear            Ears- Hearing, canals-normal            Nose- Clear, no-Septal dev, mucus, polyps, erosion, perforation             Throat- Mallampati III , mucosa clear , drainage- none, tonsils- atrophic,  Neck- flexible , trachea midline, no stridor , thyroid  nl, carotid no bruit Chest - symmetrical excursion , unlabored           Heart/CV- RRR , no murmur , no gallop  , no rub, nl s1 s2                           - JVD- none , edema- none, stasis changes- none, varices- none           Lung-clear, cough-none, dullness-none, rub- none           Chest wall-  Abd- +bandaged after hernia surgery Br/ Gen/ Rectal- Not done, not indicated Extrem- cyanosis- none, clubbing, none, atrophy- none, strength- nl Neuro- grossly intact to observation

## 2024-10-25 ENCOUNTER — Ambulatory Visit: Admitting: Internal Medicine

## 2024-10-25 ENCOUNTER — Encounter: Payer: Self-pay | Admitting: Internal Medicine

## 2024-10-25 VITALS — BP 134/87 | HR 70 | Ht 67.0 in | Wt 214.6 lb

## 2024-10-25 DIAGNOSIS — G4733 Obstructive sleep apnea (adult) (pediatric): Secondary | ICD-10-CM

## 2024-10-25 NOTE — Patient Instructions (Addendum)
 Glad you are doing well now. We can continue CPAP auto 5-15 with mask of choice, humidifier, supplies, airView  Dr Johnny can refill zolpidem  when needed

## 2024-11-08 ENCOUNTER — Inpatient Hospital Stay

## 2024-11-08 ENCOUNTER — Inpatient Hospital Stay: Attending: Oncology

## 2024-11-08 ENCOUNTER — Inpatient Hospital Stay: Attending: Oncology | Admitting: Oncology

## 2024-11-08 ENCOUNTER — Encounter: Payer: Self-pay | Admitting: Oncology

## 2024-11-08 VITALS — BP 128/81 | HR 63 | Temp 97.5°F | Resp 18 | Ht 67.0 in | Wt 218.9 lb

## 2024-11-08 DIAGNOSIS — J849 Interstitial pulmonary disease, unspecified: Secondary | ICD-10-CM | POA: Insufficient documentation

## 2024-11-08 DIAGNOSIS — D751 Secondary polycythemia: Secondary | ICD-10-CM | POA: Insufficient documentation

## 2024-11-08 DIAGNOSIS — K146 Glossodynia: Secondary | ICD-10-CM | POA: Diagnosis not present

## 2024-11-08 DIAGNOSIS — Z806 Family history of leukemia: Secondary | ICD-10-CM | POA: Insufficient documentation

## 2024-11-08 DIAGNOSIS — Z79899 Other long term (current) drug therapy: Secondary | ICD-10-CM | POA: Insufficient documentation

## 2024-11-08 DIAGNOSIS — Z85820 Personal history of malignant melanoma of skin: Secondary | ICD-10-CM | POA: Diagnosis not present

## 2024-11-08 LAB — CBC WITH DIFFERENTIAL (CANCER CENTER ONLY)
Abs Immature Granulocytes: 0.02 K/uL (ref 0.00–0.07)
Basophils Absolute: 0.1 K/uL (ref 0.0–0.1)
Basophils Relative: 2 %
Eosinophils Absolute: 0.5 K/uL (ref 0.0–0.5)
Eosinophils Relative: 8 %
HCT: 47 % (ref 39.0–52.0)
Hemoglobin: 16 g/dL (ref 13.0–17.0)
Immature Granulocytes: 0 %
Lymphocytes Relative: 28 %
Lymphs Abs: 1.6 K/uL (ref 0.7–4.0)
MCH: 30.7 pg (ref 26.0–34.0)
MCHC: 34 g/dL (ref 30.0–36.0)
MCV: 90 fL (ref 80.0–100.0)
Monocytes Absolute: 0.6 K/uL (ref 0.1–1.0)
Monocytes Relative: 10 %
Neutro Abs: 3 K/uL (ref 1.7–7.7)
Neutrophils Relative %: 52 %
Platelet Count: 268 K/uL (ref 150–400)
RBC: 5.22 MIL/uL (ref 4.22–5.81)
RDW: 12.8 % (ref 11.5–15.5)
WBC Count: 5.7 K/uL (ref 4.0–10.5)
nRBC: 0 % (ref 0.0–0.2)

## 2024-11-08 NOTE — Assessment & Plan Note (Signed)
 Chronic burning mouth syndrome persisting for over two years. Symptoms include burning sensation on the right side, possibly related to previous Bell's palsy. Neurological evaluation pending to explore potential connections. - Continue current management with pregabalin . - Follow up with neurologist for further evaluation.

## 2024-11-08 NOTE — Assessment & Plan Note (Signed)
 Multiple melanomas on the shoulder and face, previously excised. No current active lesions reported.  He has close follow-up with dermatologist.

## 2024-11-08 NOTE — Assessment & Plan Note (Signed)
 ILD may contribute to secondary polycythemia due to impaired oxygen exchange. Evaluated by a lung specialist. Sleep apnea managed with CPAP may also contribute to elevated hemoglobin levels.

## 2024-11-08 NOTE — Progress Notes (Signed)
 [facility]  HEMATOLOGY CLINIC PROGRESS NOTE  PATIENT NAME: Michael Bernard   MR#: [id number] DOB: [birth year]  Patient Care Team: Johnny Garnette LABOR, MD as PCP - General (Family Medicine) Mallipeddi, Diannah SQUIBB, MD as PCP - Cardiology (Cardiology) Lorin Norris, MD as Consulting Physician (Allergy ) Hunsucker, Donnice SAUNDERS, MD as Consulting Physician (Pulmonary Disease) Neysa Reggy BIRCH, MD as Consulting Physician (Pulmonary Disease)  Date of visit: 11/08/2024   ASSESSMENT & PLAN:   Michael Bernard is a 68 y.o. gentleman with past medical history of Burning mouth syndrome, ILD/ fibrocystic lung disease/ Lymphocytic Interstitial Pneumonia, melanoma of skin, hiatal hernia, multiple gastric ulcers, obstructive sleep apnea on CPAP, was referred to our clinic in March 2025 for evaluation of polycythemia.  Workup consistent with secondary polycythemia, likely from his interstitial lung disease and obstructive sleep apnea.  Secondary polycythemia Elevated hemoglobin levels with recent values of 18.5 and 18.3 in January and March 2025.  On his consultation with us  on 02/15/2024, labs actually showed improved hemoglobin of 16.9, hematocrit 50.  White count and platelet count were within normal limits.  CMP, iron studies, LDH, erythropoietin  levels were all unremarkable. JAK2 mutation with reflex testing for CALR, MPL, exon 12-15 mutations was also negative.  His ILD and sleep apnea may contribute to his secondary polycythemia.  Discussed risks of thrombosis, stroke, and cardiovascular complications due to increased blood viscosity.  Consider phlebotomy if hematocrit remains above 55 to minimize complications.  Since he started seeing us  in clinic, he has not needed a therapeutic phlebotomy.  Labs today showed hematocrit of 47, hemoglobin 16.0.  No indication for therapeutic phlebotomy currently.   He has not needed therapeutic phlebotomy since we started seeing him in  clinic.  Since no additional hematological workup or intervention is needed, patient can be discharged from our office for continued follow-up with his PCP and other subspecialists.  Please reach out to us  with any questions or concerns.  Please reconsult us  as necessary.  Please monitor hemoglobin and hematocrit at least every 6 months.  If hematocrit is more than 55 consistently, we can see him again for further evaluation.  Thank you for giving us  an opportunity to participate in his care.   ILD (interstitial lung disease) (HCC) ILD may contribute to secondary polycythemia due to impaired oxygen exchange. Evaluated by a lung specialist. Sleep apnea managed with CPAP may also contribute to elevated hemoglobin levels.  Hx of malignant melanoma of skin Multiple melanomas on the shoulder and face, previously excised. No current active lesions reported.  He has close follow-up with dermatologist.  Burning mouth syndrome Chronic burning mouth syndrome persisting for over two years. Symptoms include burning sensation on the right side, possibly related to previous Bell's palsy. Neurological evaluation pending to explore potential connections. - Continue current management with pregabalin . - Follow up with neurologist for further evaluation.   I spent a total of 30 minutes during this encounter with the patient including review of chart and various tests results, discussions about plan of care and coordination of care plan.  I reviewed lab results and outside records for this visit and discussed relevant results with the patient. Diagnosis, plan of care and treatment options were also discussed in detail with the patient. Opportunity provided to ask questions and answers provided to his apparent satisfaction. Provided instructions to call our clinic with any problems, questions or concerns prior to return visit. I recommended to continue follow-up with PCP and sub-specialists. He verbalized  understanding  and agreed with the plan. No barriers to learning was detected.  Chinita Patten, MD  11/08/2024 9:21 AM  [facility] - A DEPT OF [facility] [address] [place] [address] Dept: [phone] Dept Fax: [phone]   CHIEF COMPLAINT/ REASON FOR VISIT:  Follow-up for secondary polycythemia, likely contributed by his interstitial lung disease and obstructive sleep apnea.  INTERVAL HISTORY:  Discussed the use of AI scribe software for clinical note transcription with the patient, who gave verbal consent to proceed.  History of Present Illness  Michael Bernard is a 68 year old male with secondary polycythemia who presents for hematology follow-up to monitor hematocrit stability.  He has secondary polycythemia with prior episodes of marked hematocrit elevation, most notably in January and February of the previous year, during which he experienced significant illness and hematocrit levels were markedly increased. Over the past year, hematocrit has stabilized and remained within normal limits, with the most recent value at 47. He has not required phlebotomy at his last visit or today. He undergoes laboratory monitoring at least every six months, with the next annual labs scheduled for January or February. He denies current symptoms of nausea, vomiting, or bowel disturbances.  He continues regular dermatologic surveillance for malignant melanoma, with ongoing treatment for a hand lesion and a follow-up dermatology appointment scheduled in January.  He underwent hernia repair approximately one month ago and continues to experience localized pain with activities such as bending over the sink, but otherwise feels well. This pain is not associated with gastrointestinal symptoms or other complications.  He manages burning mouth syndrome with pregabalin  and uses CPAP for obstructive sleep apnea. He follows with  pulmonology for interstitial lung disease and is transitioning care due to his pulmonologist's retirement.   SUMMARY OF HEMATOLOGIC HISTORY:  On 01/27/2024, routine labs at his PCPs office showed elevated hemoglobin of 18.3, hematocrit of 54.6.  White count and platelet count were within normal limits.  Previously on 12/18/2018, his hemoglobin was 18.5, hematocrit 55.8.  Given persistent polycythemia, referral was sent to us  for further evaluation.   The patient reports that this is a new finding and has never had high hemoglobin count in the past. The patient also mentions a significant weight loss of eight pounds in five days due to a suspected norovirus infection earlier in the year.   The patient's burning mouth syndrome has been persistent and has led to dietary changes, including avoiding spicy foods, soy, soft drinks, sugar, and bread. Despite these changes, the patient reports that the burning sensation is still present and affects his daily life.   The patient's interstitial lung disease and sleep apnea are reportedly under control with the use of a CPAP machine. However, the patient reports struggling with insomnia and only getting four to six hours of sleep per night.   The patient also mentions a history of multiple melanomas that have been surgically removed. The patient is also scheduled for a hernia repair, which has been postponed due to the current investigation of his high hemoglobin count.   Family history of leukemia noted.    On his consultation with us  on 02/15/2024, labs actually showed improved hemoglobin of 16.9, hematocrit 50.  White count and platelet count were within normal limits.  CMP, iron studies, LDH, erythropoietin  levels were all unremarkable. JAK2 mutation with reflex testing for CALR, MPL, exon 12-15 mutations was also negative.   His ILD and sleep apnea  may contribute to his secondary polycythemia.   Discussed risks of thrombosis, stroke, and cardiovascular  complications due to increased blood viscosity.   Consider phlebotomy if hematocrit remains above 55 to minimize complications.   I have reviewed the past medical history, past surgical history, social history and family history with the patient and they are unchanged from previous note.  ALLERGIES: He is allergic to trazodone  and nefazodone.  MEDICATIONS:  Current Outpatient Medications  Medication Sig Dispense Refill   acetaminophen  (TYLENOL ) 500 MG tablet Take 1,000 mg by mouth every 6 (six) hours as needed for moderate pain (pain score 4-6).     Azelastine -Fluticasone  (DYMISTA ) 137-50 MCG/ACT SUSP Place 1 spray into both nostrils daily as needed (allergies).     budesonide -glycopyrrolate -formoterol  (BREZTRI  AEROSPHERE) 160-9-4.8 MCG/ACT AERO inhaler Inhale 2 puffs into the lungs in the morning and at bedtime. 1 each 11   cetirizine (ZYRTEC) 10 MG tablet Take 10 mg by mouth daily as needed for allergies.     EPINEPHrine  0.3 mg/0.3 mL IJ SOAJ injection Inject 0.3 mg into the muscle as needed for anaphylaxis.     meclizine  (ANTIVERT ) 25 MG tablet Take 25 mg by mouth 3 (three) times daily as needed for dizziness.     omeprazole  (PRILOSEC) 40 MG capsule TAKE 1 CAPSULE (40 MG TOTAL) BY MOUTH DAILY. 90 capsule 3   pregabalin  (LYRICA ) 300 MG capsule TAKE 1 CAPSULE BY MOUTH EVERY DAY (Patient taking differently: Take 300 mg by mouth at bedtime.) 90 capsule 1   tacrolimus (PROTOPIC) 0.1 % ointment Apply 1 Application topically daily.     zolpidem  (AMBIEN ) 10 MG tablet TAKE 1 TABLET BY MOUTH AT BEDTIME FOR SLEEP IF NEEDED (Patient taking differently: Take 10 mg by mouth at bedtime.) 90 tablet 1   No current facility-administered medications for this visit.     REVIEW OF SYSTEMS:    Review of Systems - Oncology  All other pertinent systems were reviewed with the patient and are negative.  PHYSICAL EXAMINATION:    Onc Performance Status - 11/08/24 0844       ECOG Perf Status   ECOG Perf  Status Ambulatory and capable of all selfcare but unable to carry out any work activities.  Up and about more than 50% of waking hours      KPS SCALE   KPS % SCORE Cares for self, unable to carry on normal activity or to do active work           Vitals:   11/08/24 0843  BP: 128/81  Pulse: 63  Resp: 18  Temp: (!) 97.5 F (36.4 C)  SpO2: 98%    Filed Weights   11/08/24 0843  Weight: 218 lb 14.4 oz (99.3 kg)     Physical Exam Constitutional:      General: He is not in acute distress.    Appearance: Normal appearance.  HENT:     Head: Normocephalic and atraumatic.  Eyes:     Conjunctiva/sclera: Conjunctivae normal.  Cardiovascular:     Rate and Rhythm: Normal rate and regular rhythm.  Pulmonary:     Effort: Pulmonary effort is normal. No respiratory distress.  Abdominal:     General: There is no distension.  Neurological:     General: No focal deficit present.     Mental Status: He is alert and oriented to person, place, and time.  Psychiatric:        Mood and Affect: Mood normal.        Behavior:  Behavior normal.      LABORATORY DATA:   I have reviewed the data as listed.  Results for orders placed or performed in visit on 11/08/24  CBC with Differential (Cancer Center Only)  Result Value Ref Range   WBC Count 5.7 4.0 - 10.5 K/uL   RBC 5.22 4.22 - 5.81 MIL/uL   Hemoglobin 16.0 13.0 - 17.0 g/dL   HCT 52.9 60.9 - 47.9 %   MCV 90.0 80.0 - 100.0 fL   MCH 30.7 26.0 - 34.0 pg   MCHC 34.0 30.0 - 36.0 g/dL   RDW 87.1 88.4 - 84.4 %   Platelet Count 268 150 - 400 K/uL   nRBC 0.0 0.0 - 0.2 %   Neutrophils Relative % 52 %   Neutro Abs 3.0 1.7 - 7.7 K/uL   Lymphocytes Relative 28 %   Lymphs Abs 1.6 0.7 - 4.0 K/uL   Monocytes Relative 10 %   Monocytes Absolute 0.6 0.1 - 1.0 K/uL   Eosinophils Relative 8 %   Eosinophils Absolute 0.5 0.0 - 0.5 K/uL   Basophils Relative 2 %   Basophils Absolute 0.1 0.0 - 0.1 K/uL   Immature Granulocytes 0 %   Abs Immature  Granulocytes 0.02 0.00 - 0.07 K/uL     RADIOGRAPHIC STUDIES:  No recent pertinent imaging studies available to review.  No orders of the defined types were placed in this encounter.    Future Appointments  Date Time Provider Department Center  12/05/2024  8:00 AM [facility]-ANNUAL WELLNESS VISIT [facility] [address]     This document was completed utilizing speech recognition software. Grammatical errors, random word insertions, pronoun errors, and incomplete sentences are an occasional consequence of this system due to software limitations, ambient noise, and hardware issues. Any formal questions or concerns about the content, text or information contained within the body of this dictation should be directly addressed to the provider for clarification.

## 2024-11-08 NOTE — Assessment & Plan Note (Addendum)
 Elevated hemoglobin levels with recent values of 18.5 and 18.3 in January and March 2025.  On his consultation with us  on 02/15/2024, labs actually showed improved hemoglobin of 16.9, hematocrit 50.  White count and platelet count were within normal limits.  CMP, iron studies, LDH, erythropoietin  levels were all unremarkable. JAK2 mutation with reflex testing for CALR, MPL, exon 12-15 mutations was also negative.  His ILD and sleep apnea may contribute to his secondary polycythemia.  Discussed risks of thrombosis, stroke, and cardiovascular complications due to increased blood viscosity.  Consider phlebotomy if hematocrit remains above 55 to minimize complications.  Since he started seeing us  in clinic, he has not needed a therapeutic phlebotomy.  Labs today showed hematocrit of 47, hemoglobin 16.0.  No indication for therapeutic phlebotomy currently.   He has not needed therapeutic phlebotomy since we started seeing him in clinic.  Since no additional hematological workup or intervention is needed, patient can be discharged from our office for continued follow-up with his PCP and other subspecialists.  Please reach out to us  with any questions or concerns.  Please reconsult us  as necessary.  Please monitor hemoglobin and hematocrit at least every 6 months.  If hematocrit is more than 55 consistently, we can see him again for further evaluation.  Thank you for giving us  an opportunity to participate in his care.

## 2024-11-30 ENCOUNTER — Other Ambulatory Visit: Payer: Self-pay | Admitting: Pulmonary Disease

## 2024-11-30 ENCOUNTER — Telehealth: Payer: Self-pay | Admitting: Internal Medicine

## 2024-11-30 MED ORDER — ZOLPIDEM TARTRATE 10 MG PO TABS: 10.0000 mg | ORAL_TABLET | Freq: Every day | ORAL | 1 refills | Status: AC

## 2024-11-30 NOTE — Telephone Encounter (Unsigned)
 Copied from CRM #[id number]. Topic: Clinical - Medication Refill >> Nov 30, 2024  9:42 AM Isabell A wrote: Medication: zolpidem  (AMBIEN ) 10 MG tablet [[id number]]   Requesting 90 day supply      Has the patient contacted their pharmacy? Yes (Agent: If no, request that the patient contact the pharmacy for the refill. If patient does not wish to contact the pharmacy document the reason why and proceed with request.) (Agent: If yes, when and what did the pharmacy advise?)  This is the patient's preferred pharmacy:  [facility] - [place], [place] - [address] [place] [address] Phone: [phone] Fax: [phone]  Is this the correct pharmacy for this prescription? Yes If no, delete pharmacy and type the correct one.   Has the prescription been filled recently? Yes  Is the patient out of the medication? No  Has the patient been seen for an appointment in the last year OR does the patient have an upcoming appointment? Yes  Can we respond through MyChart? No  Agent: Please be advised that Rx refills may take up to 3 business days. We ask that you follow-up with your pharmacy.

## 2024-11-30 NOTE — Telephone Encounter (Signed)
 Message sent to Dr. Neda since Dr. Neysa has retired.  Dr. Neda, Patient requesting 90 day supply of Ambien .  Thank you.

## 2024-12-03 ENCOUNTER — Encounter: Payer: Self-pay | Admitting: Oncology

## 2024-12-04 ENCOUNTER — Encounter: Payer: Self-pay | Admitting: Oncology

## 2024-12-05 ENCOUNTER — Ambulatory Visit (INDEPENDENT_AMBULATORY_CARE_PROVIDER_SITE_OTHER): Payer: Medicare Other

## 2024-12-05 VITALS — BP 120/60 | HR 60 | Temp 98.4°F | Ht 67.0 in | Wt 218.7 lb

## 2024-12-05 DIAGNOSIS — Z0000 Encounter for general adult medical examination without abnormal findings: Secondary | ICD-10-CM

## 2024-12-05 MED ORDER — FLUTICASONE PROPIONATE 50 MCG/ACT NA SUSP: 2.0000 | Freq: Every day | NASAL | 2 refills | Status: DC

## 2024-12-05 MED ORDER — AZELASTINE-FLUTICASONE 137-50 MCG/ACT NA SUSP: 1.0000 | Freq: Every day | NASAL | 2 refills | Status: DC | PRN

## 2024-12-05 NOTE — Patient Instructions (Addendum)
 Michael Bernard,  Thank you for taking the time for your Medicare Wellness Visit. I appreciate your continued commitment to your health goals. Please review the care plan we discussed, and feel free to reach out if I can assist you further.  Please note that Annual Wellness Visits do not include a physical exam. Some assessments may be limited, especially if the visit was conducted virtually. If needed, we may recommend an in-person follow-up with your provider.  Ongoing Care Seeing your primary care provider every 3 to 6 months helps us  monitor your health and provide consistent, personalized care.   Referrals If a referral was made during today's visit and you haven't received any updates within two weeks, please contact the referred provider directly to check on the status.  Recommended Screenings:  Health Maintenance  Topic Date Due   Hepatitis C Screening  Never done   Zoster (Shingles) Vaccine (1 of 2) Never done   COVID-19 Vaccine (4 - 2025-26 season) 07/23/2024   Pneumococcal Vaccine for age over 4 (1 of 2 - PCV) 02/19/2025*   Flu Shot  03/22/2025*   Colon Cancer Screening  07/20/2025   Medicare Annual Wellness Visit  12/05/2025   DTaP/Tdap/Td vaccine (2 - Tdap) 04/15/2033   Meningitis B Vaccine  Aged Out  *Topic was postponed. The date shown is not the original due date.       12/05/2024    8:20 AM  Advanced Directives  Does Patient Have a Medical Advance Directive? Yes  Type of Estate Agent of [place];Living will  Does patient want to make changes to medical advance directive? No - Patient declined  Copy of Healthcare Power of Attorney in Chart? No - copy requested    Vision: Annual vision screenings are recommended for early detection of glaucoma, cataracts, and diabetic retinopathy. These exams can also reveal signs of chronic conditions such as diabetes and high blood pressure.  Dental: Annual dental screenings help detect early signs of oral  cancer, gum disease, and other conditions linked to overall health, including heart disease and diabetes.  Please see the attached documents for additional preventive care recommendations.

## 2024-12-05 NOTE — Progress Notes (Signed)
 "  Chief Complaint  Patient presents with   Medicare Wellness     Subjective:   Michael Bernard is a 69 y.o. male who presents for a Medicare Annual Wellness Visit.  Visit info / Clinical Intake: Medicare Wellness Visit Type:: Subsequent Annual Wellness Visit Persons participating in visit and providing information:: patient Medicare Wellness Visit Mode:: In-person (required for WTM) Interpreter Needed?: No Pre-visit prep was completed: yes AWV questionnaire completed by patient prior to visit?: yes Date:: 12/03/24 Living arrangements:: lives with spouse/significant other Patient's Overall Health Status Rating: good Typical amount of pain: some Does pain affect daily life?: no Are you currently prescribed opioids?: no  Dietary Habits and Nutritional Risks How many meals a day?: 2 Eats fruit and vegetables daily?: (!) no Most meals are obtained by: preparing own meals Diabetic:: no  Functional Status Activities of Daily Living (to include ambulation/medication): Independent Ambulation: Independent with device- listed below Home Assistive Devices/Equipment: CPAP; Eyeglasses Medication Administration: Needs assistance (comment) (Wife assist with injections) Is this a change from baseline?: Pre-admission baseline Home Management (perform basic housework or laundry): Independent Manage your own finances?: yes Primary transportation is: driving Concerns about vision?: no *vision screening is required for WTM* Concerns about hearing?: no  Fall Screening Falls in the past year?: 0 Number of falls in past year: 0 Was there an injury with Fall?: 0 Fall Risk Category Calculator: 0 Patient Fall Risk Level: Low Fall Risk  Fall Risk Patient at Risk for Falls Due to: No Fall Risks Fall risk Follow up: Falls evaluation completed  Home and Transportation Safety: All rugs have non-skid backing?: yes All stairs or steps have railings?: yes Grab bars in the bathtub or shower?:  yes Have non-skid surface in bathtub or shower?: yes Good home lighting?: yes Regular seat belt use?: yes Hospital stays in the last year:: (!) yes How many hospital stays:: other (No Hospital stays in last year)  Cognitive Assessment Difficulty concentrating, remembering, or making decisions? : no Will 6CIT or Mini Cog be Completed: yes What year is it?: 0 points What month is it?: 0 points Give patient an address phrase to remember (5 components): [address] [place]  About what time is it?: 0 points Count backwards from 20 to 1: 0 points Say the months of the year in reverse: 0 points Repeat the address phrase from earlier: 0 points 6 CIT Score: 0 points  Advance Directives (For Healthcare) Does Patient Have a Medical Advance Directive?: Yes Does patient want to make changes to medical advance directive?: No - Patient declined Type of Advance Directive: Healthcare Power of [place]; Living will Copy of Healthcare Power of Attorney in Chart?: No - copy requested Copy of Living Will in Chart?: No - copy requested Would patient like information on creating a medical advance directive?: No - Patient declined  Reviewed/Updated  Reviewed/Updated: Reviewed All (Medical, Surgical, Family, Medications, Allergies, Care Teams, Patient Goals)    Allergies (verified) Trazodone  and nefazodone   Current Medications (verified) Outpatient Encounter Medications as of 12/05/2024  Medication Sig   Immune Globulin, Human,-klhw (XEMBIFY) 4 GM/20ML SOLN Inject into the skin.   [DISCONTINUED] fluticasone  (FLONASE ) 50 MCG/ACT nasal spray Place into both nostrils daily.   acetaminophen  (TYLENOL ) 500 MG tablet Take 1,000 mg by mouth every 6 (six) hours as needed for moderate pain (pain score 4-6).   Azelastine -Fluticasone  (DYMISTA ) 137-50 MCG/ACT SUSP Place 1 spray into both nostrils daily as needed (allergies).   budesonide -glycopyrrolate -formoterol  (BREZTRI  AEROSPHERE) 160-9-4.8 MCG/ACT  AERO  inhaler Inhale 2 puffs into the lungs in the morning and at bedtime.   cetirizine (ZYRTEC) 10 MG tablet Take 10 mg by mouth daily as needed for allergies.   EPINEPHrine  0.3 mg/0.3 mL IJ SOAJ injection Inject 0.3 mg into the muscle as needed for anaphylaxis.   fluticasone  (FLONASE ) 50 MCG/ACT nasal spray Place 2 sprays into both nostrils daily.   meclizine  (ANTIVERT ) 25 MG tablet Take 25 mg by mouth 3 (three) times daily as needed for dizziness.   omeprazole  (PRILOSEC) 40 MG capsule TAKE 1 CAPSULE (40 MG TOTAL) BY MOUTH DAILY.   pregabalin  (LYRICA ) 300 MG capsule TAKE 1 CAPSULE BY MOUTH EVERY DAY (Patient taking differently: Take 300 mg by mouth at bedtime.)   tacrolimus (PROTOPIC) 0.1 % ointment Apply 1 Application topically daily.   zolpidem  (AMBIEN ) 10 MG tablet Take 1 tablet (10 mg total) by mouth at bedtime.   [DISCONTINUED] Azelastine -Fluticasone  (DYMISTA ) 137-50 MCG/ACT SUSP Place 1 spray into both nostrils daily as needed (allergies).   No facility-administered encounter medications on file as of 12/05/2024.    History: Past Medical History:  Diagnosis Date   Anxiety    situational anxiety   Arthritis    Hips, Back   Asthma    Blood dyscrasia    Polycethemia   Cancer (HCC), Melanoma    skin x 3-4 times   Coronary artery disease    MIld, nonobstructive   Depression    Dyspnea    Family history of adverse reaction to anesthesia    Mother took very long time to wake up from anesthesia   GERD (gastroesophageal reflux disease)    on meds   Headache    r/t SCIG   Hiatal hernia    History of kidney stones    ILD (interstitial lung disease) (HCC)    Multiple gastric ulcers 2015   seen on endoscopy per Dr. 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 Quita Salt, uses CPAP    Past Surgical History:  Procedure Laterality Date   COLONOSCOPY  07/20/2022   per Dr.  Lynnie Charlanne, adenomatous polyps, repeat in 3 yrs   HERNIA REPAIR Bilateral    inguinal hernias repaired with mesh   INSERTION OF MESH N/A 10/01/2024   Procedure: INSERTION OF MESH;  Surgeon: Curvin Deward MOULD, MD;  Location: [facility] OR;  Service: General;  Laterality: N/A;   KNEE SURGERY Left    arthritis and meniscal tear   LAPAROSCOPIC ASSISTED VENTRAL HERNIA REPAIR N/A 10/01/2024   Procedure: REPAIR, HERNIA, VENTRAL, LAPAROSCOPY-ASSISTED;  Surgeon: Curvin Deward MOULD, MD;  Location: [facility] OR;  Service: General;  Laterality: N/A;   LEFT HEART CATH AND CORONARY ANGIOGRAPHY N/A 05/22/2021   Procedure: LEFT HEART CATH AND CORONARY ANGIOGRAPHY;  Surgeon: Verlin Lonni BIRCH, MD;  Location: [facility] INVASIVE CV LAB;  Service: Cardiovascular;  Laterality: N/A;   MELANOMA EXCISION     per Dr. Trudy in [place], 3 removed from both shoulders and left temple    POLYPECTOMY  2017   TA   Family History  Problem Relation Age of Onset   Colon polyps Mother    Arthritis Mother    Cancer Mother    Hyperlipidemia Mother    Hypertension Mother    Leukemia Mother    Atrial fibrillation Father    Heart attack Father  Heart disease Father    Hypertension Father    Hypertension Sister    Seizures Sister    Colon cancer Neg Hx    Esophageal cancer Neg Hx    Stomach cancer Neg Hx    Rectal cancer Neg Hx    Social History   Occupational History   Occupation: retired  Tobacco Use   Smoking status: Former    Current packs/day: 0.00    Types: Cigarettes    Start date: 06/10/1976    Quit date: 06/11/1979    Years since quitting: 45.5   Smokeless tobacco: Never   Tobacco comments:    pt reports smoker 0.5 pack/month as a teenager 06/11/2019  Vaping Use   Vaping status: Never Used  Substance and Sexual Activity   Alcohol use: Not Currently    Alcohol/week: 1.0 - 2.0 standard drink of alcohol    Types: 1 - 2 Cans of beer per week    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   Drug use: Never   Sexual  activity: Yes   Tobacco Counseling Counseling given: No Tobacco comments: pt reports smoker 0.5 pack/month as a teenager 06/11/2019  SDOH Screenings   Food Insecurity: No Food Insecurity (12/05/2024)  Housing: Low Risk (12/05/2024)  Transportation Needs: No Transportation Needs (12/05/2024)  Utilities: Not At Risk (12/05/2024)  Alcohol Screen: Low Risk (11/30/2023)  Depression (PHQ2-9): Low Risk (12/05/2024)  Financial Resource Strain: Low Risk (12/03/2024)  Physical Activity: Inactive (12/05/2024)  Social Connections: Moderately Integrated (12/05/2024)  Stress: Stress Concern Present (12/05/2024)  Tobacco Use: Medium Risk (12/05/2024)  Health Literacy: Adequate Health Literacy (12/05/2024)   See flowsheets for full screening details  Depression Screen PHQ 2 & 9 Depression Scale- Over the past 2 weeks, how often have you been bothered by any of the following problems? Little interest or pleasure in doing things: 1 Feeling down, depressed, or hopeless (PHQ Adolescent also includes...irritable): 0 PHQ-2 Total Score: 1 Trouble falling or staying asleep, or sleeping too much: 1 Feeling tired or having little energy: 2 Poor appetite or overeating (PHQ Adolescent also includes...weight loss): 0 Feeling bad about yourself - or that you are a failure or have let yourself or your family down: 0 Trouble concentrating on things, such as reading the newspaper or watching television (PHQ Adolescent also includes...like school work): 1 Moving or speaking so slowly that other people could have noticed. Or the opposite - being so fidgety or restless that you have been moving around a lot more than usual: 2 Thoughts that you would be better off dead, or of hurting yourself in some way: 0 PHQ-9 Total Score: 8 If you checked off any problems, how difficult have these problems made it for you to do your work, take care of things at home, or get along with other people?: Somewhat difficult  Depression  Treatment Depression Interventions/Treatment : Patient refuses Treatment     Goals Addressed               This Visit's Progress     Remain active (pt-stated)               Objective:    Today's Vitals   12/05/24 0811  BP: 120/60  Pulse: 60  Temp: 98.4 F (36.9 C)  TempSrc: Oral  SpO2: 93%  Weight: 218 lb 11.2 oz (99.2 kg)  Height: 5' 7 (1.702 m)   Body mass index is 34.25 kg/m.  Hearing/Vision screen Hearing Screening - Comments:: Denies hearing difficulties  Vision Screening - Comments:: Wears rx glasses - up to date with routine eye exams with  [facility] Immunizations and Health Maintenance Health Maintenance  Topic Date Due   Hepatitis C Screening  Never done   Zoster Vaccines- Shingrix (1 of 2) Never done   COVID-19 Vaccine (4 - 2025-26 season) 07/23/2024   Pneumococcal Vaccine: 50+ Years (1 of 2 - PCV) 02/19/2025 (Originally 08/28/1975)   Influenza Vaccine  03/22/2025 (Originally 06/22/2024)   Colonoscopy  07/20/2025   Medicare Annual Wellness (AWV)  12/05/2025   DTaP/Tdap/Td (2 - Tdap) 04/15/2033   Meningococcal B Vaccine  Aged Out        Assessment/Plan:  This is a routine wellness examination for Jeff.  Patient Care Team: Johnny Garnette LABOR, MD as PCP - General (Family Medicine) Mallipeddi, Diannah SQUIBB, MD as PCP - Cardiology (Cardiology) Lorin Norris, MD as Consulting Physician (Allergy ) Hunsucker, Donnice SAUNDERS, MD as Consulting Physician (Pulmonary Disease) Neysa Reggy BIRCH, MD as Consulting Physician (Pulmonary Disease)  I have personally reviewed and noted the following in the patients chart:   Medical and social history Use of alcohol, tobacco or illicit drugs  Current medications and supplements including opioid prescriptions. Functional ability and status Nutritional status Physical activity Advanced directives List of other physicians Hospitalizations, surgeries, and ER visits in previous 12 months Vitals Screenings to  include cognitive, depression, and falls Referrals and appointments  No orders of the defined types were placed in this encounter.  In addition, I have reviewed and discussed with patient certain preventive protocols, quality metrics, and best practice recommendations. A written personalized care plan for preventive services as well as general preventive health recommendations were provided to patient.   Rojelio LELON Blush, LPN   8/85/[id number]   Return in 53 weeks (on 12/11/2025).  After Visit Summary: (In Person-Printed) AVS printed and given to the patient  Nurse Notes: No voiced or noted concerns at this time  Chief Complaint  Patient presents with   Medicare Wellness     Subjective:   Michael Bernard is a 69 y.o. male who presents for a Medicare Annual Wellness Visit.  Visit info / Clinical Intake: Medicare Wellness Visit Type:: Subsequent Annual Wellness Visit Persons participating in visit and providing information:: patient Medicare Wellness Visit Mode:: In-person (required for WTM) Interpreter Needed?: No Pre-visit prep was completed: yes AWV questionnaire completed by patient prior to visit?: yes Date:: 12/03/24 Living arrangements:: lives with spouse/significant other Patient's Overall Health Status Rating: good Typical amount of pain: some Does pain affect daily life?: no Are you currently prescribed opioids?: no  Dietary Habits and Nutritional Risks How many meals a day?: 2 Eats fruit and vegetables daily?: (!) no Most meals are obtained by: preparing own meals Diabetic:: no  Functional Status Activities of Daily Living (to include ambulation/medication): Independent Ambulation: Independent with device- listed below Home Assistive Devices/Equipment: CPAP; Eyeglasses Medication Administration: Needs assistance (comment) (Wife assist with injections) Is this a change from baseline?: Pre-admission baseline Home Management (perform basic housework or laundry):  Independent Manage your own finances?: yes Primary transportation is: driving Concerns about vision?: no *vision screening is required for WTM* Concerns about hearing?: no  Fall Screening Falls in the past year?: 0 Number of falls in past year: 0 Was there an injury with Fall?: 0 Fall Risk Category Calculator: 0 Patient Fall Risk Level: Low Fall Risk  Fall Risk Patient at Risk for Falls Due to: No Fall Risks Fall risk Follow up: Falls evaluation completed  Home and Transportation Safety: All rugs have non-skid backing?: yes All stairs or steps have railings?: yes Grab bars in the bathtub or shower?: yes Have non-skid surface in bathtub or shower?: yes Good home lighting?: yes Regular seat belt use?: yes Hospital stays in the last year:: (!) yes How many hospital stays:: other (No Hospital stays in last year)  Cognitive Assessment Difficulty concentrating, remembering, or making decisions? : no Will 6CIT or Mini Cog be Completed: yes What year is it?: 0 points What month is it?: 0 points Give patient an address phrase to remember (5 components): [address] [place]  About what time is it?: 0 points Count backwards from 20 to 1: 0 points Say the months of the year in reverse: 0 points Repeat the address phrase from earlier: 0 points 6 CIT Score: 0 points  Advance Directives (For Healthcare) Does Patient Have a Medical Advance Directive?: Yes Does patient want to make changes to medical advance directive?: No - Patient declined Type of Advance Directive: Healthcare Power of [place]; Living will Copy of Healthcare Power of Attorney in Chart?: No - copy requested Copy of Living Will in Chart?: No - copy requested Would patient like information on creating a medical advance directive?: No - Patient declined  Reviewed/Updated  Reviewed/Updated: Reviewed All (Medical, Surgical, Family, Medications, Allergies, Care Teams, Patient Goals)    Allergies  (verified) Trazodone  and nefazodone   Current Medications (verified) Outpatient Encounter Medications as of 12/05/2024  Medication Sig   Immune Globulin, Human,-klhw (XEMBIFY) 4 GM/20ML SOLN Inject into the skin.   [DISCONTINUED] fluticasone  (FLONASE ) 50 MCG/ACT nasal spray Place into both nostrils daily.   acetaminophen  (TYLENOL ) 500 MG tablet Take 1,000 mg by mouth every 6 (six) hours as needed for moderate pain (pain score 4-6).   Azelastine -Fluticasone  (DYMISTA ) 137-50 MCG/ACT SUSP Place 1 spray into both nostrils daily as needed (allergies).   budesonide -glycopyrrolate -formoterol  (BREZTRI  AEROSPHERE) 160-9-4.8 MCG/ACT AERO inhaler Inhale 2 puffs into the lungs in the morning and at bedtime.   cetirizine (ZYRTEC) 10 MG tablet Take 10 mg by mouth daily as needed for allergies.   EPINEPHrine  0.3 mg/0.3 mL IJ SOAJ injection Inject 0.3 mg into the muscle as needed for anaphylaxis.   fluticasone  (FLONASE ) 50 MCG/ACT nasal spray Place 2 sprays into both nostrils daily.   meclizine  (ANTIVERT ) 25 MG tablet Take 25 mg by mouth 3 (three) times daily as needed for dizziness.   omeprazole  (PRILOSEC) 40 MG capsule TAKE 1 CAPSULE (40 MG TOTAL) BY MOUTH DAILY.   pregabalin  (LYRICA ) 300 MG capsule TAKE 1 CAPSULE BY MOUTH EVERY DAY (Patient taking differently: Take 300 mg by mouth at bedtime.)   tacrolimus (PROTOPIC) 0.1 % ointment Apply 1 Application topically daily.   zolpidem  (AMBIEN ) 10 MG tablet Take 1 tablet (10 mg total) by mouth at bedtime.   [DISCONTINUED] Azelastine -Fluticasone  (DYMISTA ) 137-50 MCG/ACT SUSP Place 1 spray into both nostrils daily as needed (allergies).   No facility-administered encounter medications on file as of 12/05/2024.    History: Past Medical History:  Diagnosis Date   Anxiety    situational anxiety   Arthritis    Hips, Back   Asthma    Blood dyscrasia    Polycethemia   Cancer (HCC), Melanoma    skin x 3-4 times   Coronary artery disease    MIld, nonobstructive    Depression    Dyspnea    Family history of adverse reaction to anesthesia    Mother took very long time to  wake up from anesthesia   GERD (gastroesophageal reflux disease)    on meds   Headache    r/t SCIG   Hiatal hernia    History of kidney stones    ILD (interstitial lung disease) (HCC)    Multiple gastric ulcers 2015   seen on endoscopy per Dr. Charlanne in [place]    Paralysis [facility])    2009 and 2025 Bell's Palsy   Pneumonia    Polycythemia    Seasonal allergies    Sleep apnea    sees Dr. Quita Salt, uses CPAP    Past Surgical History:  Procedure Laterality Date   COLONOSCOPY  07/20/2022   per Dr. Lynnie Charlanne, adenomatous polyps, repeat in 3 yrs   HERNIA REPAIR Bilateral    inguinal hernias repaired with mesh   INSERTION OF MESH N/A 10/01/2024   Procedure: INSERTION OF MESH;  Surgeon: Curvin Deward MOULD, MD;  Location: [facility] OR;  Service: General;  Laterality: N/A;   KNEE SURGERY Left    arthritis and meniscal tear   LAPAROSCOPIC ASSISTED VENTRAL HERNIA REPAIR N/A 10/01/2024   Procedure: REPAIR, HERNIA, VENTRAL, LAPAROSCOPY-ASSISTED;  Surgeon: Curvin Deward MOULD, MD;  Location: [facility] OR;  Service: General;  Laterality: N/A;   LEFT HEART CATH AND CORONARY ANGIOGRAPHY N/A 05/22/2021   Procedure: LEFT HEART CATH AND CORONARY ANGIOGRAPHY;  Surgeon: Verlin Lonni BIRCH, MD;  Location: [facility] INVASIVE CV LAB;  Service: Cardiovascular;  Laterality: N/A;   MELANOMA EXCISION     per Dr. Trudy in [place], 3 removed from both shoulders and left temple    POLYPECTOMY  2017   TA   Family History  Problem Relation Age of Onset   Colon polyps Mother    Arthritis Mother    Cancer Mother    Hyperlipidemia Mother    Hypertension Mother    Leukemia Mother    Atrial fibrillation Father    Heart attack Father    Heart disease Father    Hypertension Father    Hypertension Sister    Seizures Sister    Colon cancer Neg Hx    Esophageal cancer Neg Hx    Stomach cancer Neg Hx    Rectal  cancer Neg Hx    Social History   Occupational History   Occupation: retired  Tobacco Use   Smoking status: Former    Current packs/day: 0.00    Types: Cigarettes    Start date: 06/10/1976    Quit date: 06/11/1979    Years since quitting: 45.5   Smokeless tobacco: Never   Tobacco comments:    pt reports smoker 0.5 pack/month as a teenager 06/11/2019  Vaping Use   Vaping status: Never Used  Substance and Sexual Activity   Alcohol use: Not Currently    Alcohol/week: 1.0 - 2.0 standard drink of alcohol    Types: 1 - 2 Cans of beer per week    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   Drug use: Never   Sexual activity: Yes   Tobacco Counseling Counseling given: No Tobacco comments: pt reports smoker 0.5 pack/month as a teenager 06/11/2019  SDOH Screenings   Food Insecurity: No Food Insecurity (12/05/2024)  Housing: Low Risk (12/05/2024)  Transportation Needs: No Transportation Needs (12/05/2024)  Utilities: Not At Risk (12/05/2024)  Alcohol Screen: Low Risk (11/30/2023)  Depression (PHQ2-9): Low Risk (12/05/2024)  Financial Resource Strain: Low Risk (12/03/2024)  Physical Activity: Inactive (12/05/2024)  Social Connections: Moderately Integrated (12/05/2024)  Stress: Stress Concern Present (12/05/2024)  Tobacco Use:  Medium Risk (12/05/2024)  Health Literacy: Adequate Health Literacy (12/05/2024)   See flowsheets for full screening details  Depression Screen PHQ 2 & 9 Depression Scale- Over the past 2 weeks, how often have you been bothered by any of the following problems? Little interest or pleasure in doing things: 1 Feeling down, depressed, or hopeless (PHQ Adolescent also includes...irritable): 0 PHQ-2 Total Score: 1 Trouble falling or staying asleep, or sleeping too much: 1 Feeling tired or having little energy: 2 Poor appetite or overeating (PHQ Adolescent also includes...weight loss): 0 Feeling bad about yourself - or that you are a failure or have let yourself or your  family down: 0 Trouble concentrating on things, such as reading the newspaper or watching television (PHQ Adolescent also includes...like school work): 1 Moving or speaking so slowly that other people could have noticed. Or the opposite - being so fidgety or restless that you have been moving around a lot more than usual: 2 Thoughts that you would be better off dead, or of hurting yourself in some way: 0 PHQ-9 Total Score: 8 If you checked off any problems, how difficult have these problems made it for you to do your work, take care of things at home, or get along with other people?: Somewhat difficult  Depression Treatment Depression Interventions/Treatment : Patient refuses Treatment     Goals Addressed               This Visit's Progress     Remain active (pt-stated)               Objective:    Today's Vitals   12/05/24 0811  BP: 120/60  Pulse: 60  Temp: 98.4 F (36.9 C)  TempSrc: Oral  SpO2: 93%  Weight: 218 lb 11.2 oz (99.2 kg)  Height: 5' 7 (1.702 m)   Body mass index is 34.25 kg/m.  Hearing/Vision screen Hearing Screening - Comments:: Denies hearing difficulties   Vision Screening - Comments:: Wears rx glasses - up to date with routine eye exams with  [facility] Immunizations and Health Maintenance Health Maintenance  Topic Date Due   Hepatitis C Screening  Never done   Zoster Vaccines- Shingrix (1 of 2) Never done   COVID-19 Vaccine (4 - 2025-26 season) 07/23/2024   Pneumococcal Vaccine: 50+ Years (1 of 2 - PCV) 02/19/2025 (Originally 08/28/1975)   Influenza Vaccine  03/22/2025 (Originally 06/22/2024)   Colonoscopy  07/20/2025   Medicare Annual Wellness (AWV)  12/05/2025   DTaP/Tdap/Td (2 - Tdap) 04/15/2033   Meningococcal B Vaccine  Aged Out        Assessment/Plan:  This is a routine wellness examination for [place].  Patient Care Team: Johnny Garnette LABOR, MD as PCP - General (Family Medicine) Mallipeddi, Diannah SQUIBB, MD as PCP - Cardiology  (Cardiology) Lorin Norris, MD as Consulting Physician (Allergy ) Hunsucker, Donnice SAUNDERS, MD as Consulting Physician (Pulmonary Disease) Neysa Reggy BIRCH, MD as Consulting Physician (Pulmonary Disease)  I have personally reviewed and noted the following in the patients chart:   Medical and social history Use of alcohol, tobacco or illicit drugs  Current medications and supplements including opioid prescriptions. Functional ability and status Nutritional status Physical activity Advanced directives List of other physicians Hospitalizations, surgeries, and ER visits in previous 12 months Vitals Screenings to include cognitive, depression, and falls Referrals and appointments  No orders of the defined types were placed in this encounter.  In addition, I have reviewed and discussed with patient certain preventive protocols, quality  metrics, and best practice recommendations. A written personalized care plan for preventive services as well as general preventive health recommendations were provided to patient.   Rojelio LELON Blush, LPN   8/85/7973   Return in 53 weeks (on 12/11/2025).  After Visit Summary: (In Person-Printed) AVS printed and given to the patient  Nurse Notes: No voiced or noted concerns at this time "

## 2024-12-10 ENCOUNTER — Telehealth: Payer: Self-pay

## 2024-12-10 ENCOUNTER — Encounter: Payer: Self-pay | Admitting: Oncology

## 2024-12-10 NOTE — Telephone Encounter (Signed)
 Copied from CRM #[id number]. Topic: Clinical - Prescription Issue >> Dec 10, 2024 12:50 PM Alfonso ORN wrote: Reason for CRM: [facility] called on behalf of pt for Fluticasone  (DYMISTA ) 137-50 MCG/ACT SUSP [[id number]]  cheaper for pt if 2 separate nasal sprays. pharmacy asking  for verbal ok  to separate ingredients for 2 different or if new rx can be completed. please advise  callback [phone] fax; [phone]

## 2024-12-10 NOTE — Telephone Encounter (Signed)
 Tell the pharmacy is OK to split up the medications

## 2024-12-11 NOTE — Telephone Encounter (Signed)
 Called pt pharmacy advised of VO to split the nasal spray to two different orders,the pharmacist stated that they will need new  Rx sent to them to process request. Please advise

## 2024-12-12 MED ORDER — FLUTICASONE PROPIONATE 50 MCG/ACT NA SUSP: 2.0000 | Freq: Every day | NASAL | 11 refills | Status: AC

## 2024-12-12 MED ORDER — AZELASTINE HCL 0.1 % NA SOLN: 2.0000 | Freq: Two times a day (BID) | NASAL | 11 refills | Status: AC

## 2024-12-12 NOTE — Addendum Note (Signed)
 Addended by: JOHNNY SENIOR A on: 12/12/2024 08:30 AM   Modules accepted: Orders

## 2024-12-12 NOTE — Telephone Encounter (Signed)
 Done

## 2024-12-17 ENCOUNTER — Ambulatory Visit: Admitting: Internal Medicine

## 2024-12-24 ENCOUNTER — Ambulatory Visit: Admitting: Internal Medicine

## 2024-12-25 ENCOUNTER — Encounter: Payer: Self-pay | Admitting: Family Medicine

## 2024-12-25 ENCOUNTER — Other Ambulatory Visit: Payer: Self-pay | Admitting: Family Medicine

## 2024-12-26 NOTE — Telephone Encounter (Signed)
 I did this yesterday.

## 2025-01-08 ENCOUNTER — Ambulatory Visit: Admitting: Internal Medicine

## 2025-01-28 ENCOUNTER — Encounter: Admitting: Family Medicine

## 2025-12-11 ENCOUNTER — Ambulatory Visit
